# Patient Record
Sex: Female | Born: 1935 | Race: White | Hispanic: No | Smoking: Former smoker
Health system: Southern US, Community
[De-identification: ages and names within clinical notes are randomized; demographics above are authoritative.]

## PROBLEM LIST (undated history)

## (undated) DIAGNOSIS — C801 Malignant (primary) neoplasm, unspecified: Secondary | ICD-10-CM

## (undated) DIAGNOSIS — I10 Essential (primary) hypertension: Secondary | ICD-10-CM

## (undated) DIAGNOSIS — I6529 Occlusion and stenosis of unspecified carotid artery: Secondary | ICD-10-CM

## (undated) DIAGNOSIS — E079 Disorder of thyroid, unspecified: Secondary | ICD-10-CM

## (undated) DIAGNOSIS — N390 Urinary tract infection, site not specified: Secondary | ICD-10-CM

## (undated) DIAGNOSIS — Z7189 Other specified counseling: Secondary | ICD-10-CM

## (undated) DIAGNOSIS — J189 Pneumonia, unspecified organism: Secondary | ICD-10-CM

## (undated) HISTORY — DX: Other specified counseling: Z71.89

## (undated) HISTORY — DX: Disorder of thyroid, unspecified: E07.9

## (undated) HISTORY — PX: EYE SURGERY: SHX253

## (undated) SURGERY — BRONCHOSCOPY, WITH FLUOROSCOPY
Anesthesia: Moderate Sedation

---

## 1999-01-21 ENCOUNTER — Encounter: Payer: Self-pay | Admitting: Internal Medicine

## 1999-01-21 ENCOUNTER — Ambulatory Visit (HOSPITAL_COMMUNITY): Admission: RE | Admit: 1999-01-21 | Discharge: 1999-01-21 | Payer: Self-pay | Admitting: Internal Medicine

## 2000-04-23 ENCOUNTER — Ambulatory Visit (HOSPITAL_COMMUNITY): Admission: RE | Admit: 2000-04-23 | Discharge: 2000-04-23 | Payer: Self-pay | Admitting: Internal Medicine

## 2000-04-23 ENCOUNTER — Encounter: Payer: Self-pay | Admitting: Internal Medicine

## 2000-12-20 ENCOUNTER — Other Ambulatory Visit: Admission: RE | Admit: 2000-12-20 | Discharge: 2000-12-20 | Payer: Self-pay | Admitting: Internal Medicine

## 2001-05-09 ENCOUNTER — Ambulatory Visit (HOSPITAL_COMMUNITY): Admission: RE | Admit: 2001-05-09 | Discharge: 2001-05-09 | Payer: Self-pay | Admitting: Internal Medicine

## 2001-05-09 ENCOUNTER — Encounter: Payer: Self-pay | Admitting: Internal Medicine

## 2002-05-19 ENCOUNTER — Ambulatory Visit (HOSPITAL_COMMUNITY): Admission: RE | Admit: 2002-05-19 | Discharge: 2002-05-19 | Payer: Self-pay | Admitting: Internal Medicine

## 2002-05-19 ENCOUNTER — Encounter: Payer: Self-pay | Admitting: Internal Medicine

## 2003-06-01 ENCOUNTER — Ambulatory Visit (HOSPITAL_COMMUNITY): Admission: RE | Admit: 2003-06-01 | Discharge: 2003-06-01 | Payer: Self-pay | Admitting: Internal Medicine

## 2003-12-20 ENCOUNTER — Other Ambulatory Visit: Admission: RE | Admit: 2003-12-20 | Discharge: 2003-12-20 | Payer: Self-pay | Admitting: Internal Medicine

## 2004-08-22 ENCOUNTER — Ambulatory Visit (HOSPITAL_COMMUNITY): Admission: RE | Admit: 2004-08-22 | Discharge: 2004-08-22 | Payer: Self-pay | Admitting: Internal Medicine

## 2004-10-03 ENCOUNTER — Ambulatory Visit (HOSPITAL_COMMUNITY): Admission: RE | Admit: 2004-10-03 | Discharge: 2004-10-03 | Payer: Self-pay | Admitting: Internal Medicine

## 2005-08-28 ENCOUNTER — Ambulatory Visit (HOSPITAL_COMMUNITY): Admission: RE | Admit: 2005-08-28 | Discharge: 2005-08-28 | Payer: Self-pay | Admitting: Internal Medicine

## 2006-01-08 ENCOUNTER — Emergency Department (HOSPITAL_COMMUNITY): Admission: EM | Admit: 2006-01-08 | Discharge: 2006-01-08 | Payer: Self-pay | Admitting: Emergency Medicine

## 2006-02-02 ENCOUNTER — Ambulatory Visit (HOSPITAL_COMMUNITY): Admission: RE | Admit: 2006-02-02 | Discharge: 2006-02-02 | Payer: Self-pay | Admitting: Internal Medicine

## 2006-02-02 ENCOUNTER — Encounter: Payer: Self-pay | Admitting: Vascular Surgery

## 2006-09-17 ENCOUNTER — Ambulatory Visit (HOSPITAL_COMMUNITY): Admission: RE | Admit: 2006-09-17 | Discharge: 2006-09-17 | Payer: Self-pay | Admitting: Internal Medicine

## 2007-02-04 ENCOUNTER — Ambulatory Visit (HOSPITAL_COMMUNITY): Admission: RE | Admit: 2007-02-04 | Discharge: 2007-02-04 | Payer: Self-pay | Admitting: Internal Medicine

## 2007-09-30 ENCOUNTER — Ambulatory Visit (HOSPITAL_COMMUNITY): Admission: RE | Admit: 2007-09-30 | Discharge: 2007-09-30 | Payer: Self-pay | Admitting: Internal Medicine

## 2008-03-02 ENCOUNTER — Ambulatory Visit (HOSPITAL_COMMUNITY): Admission: RE | Admit: 2008-03-02 | Discharge: 2008-03-02 | Payer: Self-pay | Admitting: Internal Medicine

## 2008-10-05 ENCOUNTER — Ambulatory Visit (HOSPITAL_COMMUNITY): Admission: RE | Admit: 2008-10-05 | Discharge: 2008-10-05 | Payer: Self-pay | Admitting: Internal Medicine

## 2009-10-11 ENCOUNTER — Ambulatory Visit (HOSPITAL_COMMUNITY): Admission: RE | Admit: 2009-10-11 | Discharge: 2009-10-11 | Payer: Self-pay | Admitting: Internal Medicine

## 2010-08-15 ENCOUNTER — Ambulatory Visit: Payer: Medicare Other | Attending: Family Medicine | Admitting: Physical Therapy

## 2010-08-15 DIAGNOSIS — H8110 Benign paroxysmal vertigo, unspecified ear: Secondary | ICD-10-CM | POA: Insufficient documentation

## 2010-08-15 DIAGNOSIS — IMO0001 Reserved for inherently not codable concepts without codable children: Secondary | ICD-10-CM | POA: Insufficient documentation

## 2010-08-18 ENCOUNTER — Ambulatory Visit: Payer: Medicare Other | Admitting: Physical Therapy

## 2010-08-19 ENCOUNTER — Ambulatory Visit: Payer: Medicare Other | Admitting: Physical Therapy

## 2010-08-26 ENCOUNTER — Ambulatory Visit: Payer: Medicare Other | Admitting: Physical Therapy

## 2010-08-28 ENCOUNTER — Ambulatory Visit: Payer: Medicare Other | Admitting: Physical Therapy

## 2010-09-03 ENCOUNTER — Ambulatory Visit: Payer: Medicare Other | Admitting: Rehabilitative and Restorative Service Providers"

## 2010-09-05 ENCOUNTER — Ambulatory Visit: Payer: Medicare Other | Admitting: Physical Therapy

## 2010-09-08 ENCOUNTER — Ambulatory Visit: Payer: Medicare Other | Admitting: Physical Therapy

## 2010-09-09 ENCOUNTER — Other Ambulatory Visit: Payer: Self-pay | Admitting: Internal Medicine

## 2010-09-09 ENCOUNTER — Encounter: Payer: Medicare Other | Admitting: Physical Therapy

## 2010-09-09 ENCOUNTER — Ambulatory Visit (HOSPITAL_COMMUNITY)
Admission: RE | Admit: 2010-09-09 | Discharge: 2010-09-09 | Disposition: A | Discharge status: Home / Self Care | Payer: Medicare Other | Source: Ambulatory Visit | Attending: Internal Medicine | Admitting: Internal Medicine

## 2010-09-09 DIAGNOSIS — I6529 Occlusion and stenosis of unspecified carotid artery: Secondary | ICD-10-CM | POA: Insufficient documentation

## 2010-09-09 DIAGNOSIS — R0989 Other specified symptoms and signs involving the circulatory and respiratory systems: Secondary | ICD-10-CM

## 2010-09-12 ENCOUNTER — Encounter: Payer: Medicare Other | Admitting: Physical Therapy

## 2010-09-29 ENCOUNTER — Other Ambulatory Visit (HOSPITAL_COMMUNITY): Payer: Self-pay | Admitting: Internal Medicine

## 2010-09-29 DIAGNOSIS — Z1231 Encounter for screening mammogram for malignant neoplasm of breast: Secondary | ICD-10-CM

## 2010-10-13 ENCOUNTER — Ambulatory Visit (HOSPITAL_COMMUNITY)
Admission: RE | Admit: 2010-10-13 | Discharge: 2010-10-13 | Disposition: A | Discharge status: Home / Self Care | Payer: Medicare Other | Source: Ambulatory Visit | Attending: Internal Medicine | Admitting: Internal Medicine

## 2010-10-13 DIAGNOSIS — Z1231 Encounter for screening mammogram for malignant neoplasm of breast: Secondary | ICD-10-CM | POA: Insufficient documentation

## 2011-02-27 ENCOUNTER — Other Ambulatory Visit (HOSPITAL_COMMUNITY): Payer: Self-pay | Admitting: Internal Medicine

## 2011-02-27 DIAGNOSIS — Z780 Asymptomatic menopausal state: Secondary | ICD-10-CM

## 2011-03-09 ENCOUNTER — Ambulatory Visit (HOSPITAL_COMMUNITY)
Admission: RE | Admit: 2011-03-09 | Discharge: 2011-03-09 | Disposition: A | Discharge status: Home / Self Care | Payer: Medicare Other | Source: Ambulatory Visit | Attending: Internal Medicine | Admitting: Internal Medicine

## 2011-03-09 DIAGNOSIS — Z780 Asymptomatic menopausal state: Secondary | ICD-10-CM

## 2011-03-09 DIAGNOSIS — Z13820 Encounter for screening for osteoporosis: Secondary | ICD-10-CM | POA: Insufficient documentation

## 2011-09-07 ENCOUNTER — Ambulatory Visit: Payer: Medicare Other | Attending: Internal Medicine | Admitting: Physical Therapy

## 2011-09-07 DIAGNOSIS — R42 Dizziness and giddiness: Secondary | ICD-10-CM | POA: Insufficient documentation

## 2011-09-07 DIAGNOSIS — IMO0001 Reserved for inherently not codable concepts without codable children: Secondary | ICD-10-CM | POA: Insufficient documentation

## 2011-09-15 ENCOUNTER — Ambulatory Visit: Payer: Medicare Other | Attending: Internal Medicine | Admitting: Physical Therapy

## 2011-09-15 DIAGNOSIS — IMO0001 Reserved for inherently not codable concepts without codable children: Secondary | ICD-10-CM | POA: Insufficient documentation

## 2011-09-15 DIAGNOSIS — R42 Dizziness and giddiness: Secondary | ICD-10-CM | POA: Insufficient documentation

## 2011-09-18 ENCOUNTER — Other Ambulatory Visit (HOSPITAL_COMMUNITY): Payer: Self-pay | Admitting: Internal Medicine

## 2011-09-18 DIAGNOSIS — Z1231 Encounter for screening mammogram for malignant neoplasm of breast: Secondary | ICD-10-CM

## 2011-09-20 ENCOUNTER — Emergency Department (INDEPENDENT_AMBULATORY_CARE_PROVIDER_SITE_OTHER)
Admission: EM | Admit: 2011-09-20 | Discharge: 2011-09-20 | Disposition: A | Discharge status: Home / Self Care | Payer: Medicare Other | Source: Home / Self Care | Attending: Emergency Medicine | Admitting: Emergency Medicine

## 2011-09-20 ENCOUNTER — Encounter (HOSPITAL_COMMUNITY): Payer: Self-pay | Admitting: *Deleted

## 2011-09-20 DIAGNOSIS — N390 Urinary tract infection, site not specified: Secondary | ICD-10-CM

## 2011-09-20 HISTORY — DX: Occlusion and stenosis of unspecified carotid artery: I65.29

## 2011-09-20 HISTORY — DX: Essential (primary) hypertension: I10

## 2011-09-20 HISTORY — DX: Urinary tract infection, site not specified: N39.0

## 2011-09-20 LAB — POCT URINALYSIS DIP (DEVICE)
Specific Gravity, Urine: 1.01 (ref 1.005–1.030)
Urobilinogen, UA: 0.2 mg/dL (ref 0.0–1.0)

## 2011-09-20 MED ORDER — NITROFURANTOIN MONOHYD MACRO 100 MG PO CAPS: 100.0000 mg | ORAL_CAPSULE | Freq: Two times a day (BID) | ORAL | Status: AC

## 2011-09-20 MED ORDER — PHENAZOPYRIDINE HCL 200 MG PO TABS: 200.0000 mg | ORAL_TABLET | Freq: Three times a day (TID) | ORAL | Status: AC | PRN

## 2011-09-20 NOTE — Discharge Instructions (Signed)

## 2011-09-20 NOTE — ED Notes (Signed)
Pt with onset of burning with urination and frequency yesterday  - yesterday pressure abd

## 2011-09-20 NOTE — ED Provider Notes (Signed)
History     CSN: [id number]  Arrival date & time 09/20/11  6578   First MD Initiated Contact with Patient 09/20/11 0957      Chief Complaint  Patient presents with  . Urinary Frequency  . Dysuria    (Consider location/radiation/quality/duration/timing/severity/associated sxs/prior treatment) HPI Comments:  Patient complains of dysuria, urinary urgency, frequency, dribbling starting yesterday. No oderous or cloudy urine, hematuria. Reports Sensation of incompletely emptying her bladder. No urinary incontinence. No abdominal pain, abdominal distention, nausea, vomiting, fevers, back pain. No aggravating, alleviating factors. Has not tried anything for her symptoms.  ROS as noted in HPI. All other ROS negative.   Patient is a 78 y.o. female presenting with dysuria.  Dysuria  This is a new problem. The current episode started yesterday. The problem occurs every urination. The quality of the pain is described as burning. There has been no fever. She is not sexually active. There is no history of pyelonephritis. Associated symptoms include frequency and urgency. Pertinent negatives include no chills, no sweats, no nausea, no vomiting, no discharge, no hematuria, no hesitancy and no flank pain. She has tried nothing for the symptoms.    Past Medical History  Diagnosis Date  . Hypertension   . External carotid artery stenosis     L side dx'd with doppler 2012  . UTI (lower urinary tract infection)     History reviewed. No pertinent past surgical history.  History reviewed. No pertinent family history.  History  Substance Use Topics  . Smoking status: Former Games developer  . Smokeless tobacco: Not on file  . Alcohol Use: No    OB History    Grav Para Term Preterm Abortions TAB SAB Ect Mult Living                  Review of Systems  Constitutional: Negative for chills.  Gastrointestinal: Negative for nausea and vomiting.  Genitourinary: Positive for dysuria, urgency and frequency.  Negative for hesitancy, hematuria and flank pain.    Allergies  Review of patient's allergies indicates no known allergies.  Home Medications   Current Outpatient Rx  Name Route Sig Dispense Refill  . ASPIRIN 81 MG PO TABS Oral Take 81 mg by mouth daily.    [name] DOXAZOSIN MESYLATE PO Oral Take by mouth.    [name] LOSARTAN POTASSIUM 25 MG PO TABS Oral Take by mouth daily.    [name] ONE-DAILY MULTI VITAMINS PO TABS Oral Take 1 tablet by mouth daily.    [name] VERAPAMIL HCL ER (CO) PO Oral Take by mouth.    [name] NITROFURANTOIN MONOHYD MACRO 100 MG PO CAPS Oral Take 1 capsule (100 mg total) by mouth 2 (two) times daily. 10 capsule 0  . PHENAZOPYRIDINE HCL 200 MG PO TABS Oral Take 1 tablet (200 mg total) by mouth 3 (three) times daily as needed for pain. 6 tablet 0    BP 160/74  Pulse 60  Temp(Src) 97.7 F (36.5 C) (Oral)  Resp 16  SpO2 97%  Physical Exam  Nursing note and vitals reviewed. Constitutional: She is oriented to person, place, and time. She appears well-developed and well-nourished. No distress.  HENT:  Head: Normocephalic and atraumatic.  Eyes: Conjunctivae and EOM are normal.  Neck: Normal range of motion.  Cardiovascular: Normal rate.   Pulmonary/Chest: Effort normal.  Abdominal: Soft. Bowel sounds are normal. She exhibits no distension and no mass. There is no tenderness. There is no CVA tenderness.  Musculoskeletal: Normal range of motion.  Neurological: She  is alert and oriented to person, place, and time.  Skin: Skin is warm and dry.  Psychiatric: She has a normal mood and affect. Her behavior is normal. Judgment and thought content normal.    ED Course  Procedures (including critical care time)  Labs Reviewed  POCT URINALYSIS DIP (DEVICE) - Abnormal; Notable for the following:    Hgb urine dipstick MODERATE (*)    Nitrite POSITIVE (*)    Leukocytes, UA MODERATE (*) Biochemical Testing Only. Please order routine urinalysis from main lab if confirmatory testing is needed.    All other components within normal limits   No results found.   1. UTI (lower urinary tract infection)       MDM  Previous records, imaging reviewed. Additional medical history obtained. Patient nontoxic, no palpable bladder. No signs of obstruction or acute urinary retention. Will send home with Macrobid, and Pyridium. BP noted. Patient asymptomatic. Discussed importance of taking her blood pressure medications on a regular basis, and monitor her pressures at home. Discussed signs for/symptoms of hypertensive emergency, and patient to go to the ER for any of these. Patient agrees with plan.        Luiz Blare, MD 09/20/11 [phone]

## 2011-09-22 ENCOUNTER — Ambulatory Visit: Payer: Medicare Other | Admitting: Physical Therapy

## 2011-09-29 ENCOUNTER — Ambulatory Visit: Payer: Medicare Other | Admitting: Physical Therapy

## 2011-10-07 ENCOUNTER — Encounter: Payer: Medicare Other | Admitting: Physical Therapy

## 2011-10-09 ENCOUNTER — Encounter: Payer: Medicare Other | Admitting: Physical Therapy

## 2011-10-14 ENCOUNTER — Ambulatory Visit (HOSPITAL_COMMUNITY)
Admission: RE | Admit: 2011-10-14 | Discharge: 2011-10-14 | Disposition: A | Discharge status: Home / Self Care | Payer: Medicare Other | Source: Ambulatory Visit | Attending: Internal Medicine | Admitting: Internal Medicine

## 2011-10-14 DIAGNOSIS — Z1231 Encounter for screening mammogram for malignant neoplasm of breast: Secondary | ICD-10-CM | POA: Insufficient documentation

## 2012-03-02 ENCOUNTER — Encounter (HOSPITAL_COMMUNITY): Payer: Medicare Other

## 2013-02-03 ENCOUNTER — Other Ambulatory Visit (HOSPITAL_COMMUNITY): Payer: Self-pay | Admitting: Internal Medicine

## 2013-02-03 DIAGNOSIS — Z0000 Encounter for general adult medical examination without abnormal findings: Secondary | ICD-10-CM

## 2013-02-10 ENCOUNTER — Ambulatory Visit (HOSPITAL_COMMUNITY)
Admission: RE | Admit: 2013-02-10 | Discharge: 2013-02-10 | Disposition: A | Discharge status: Home / Self Care | Payer: Medicare Other | Source: Ambulatory Visit | Attending: Internal Medicine | Admitting: Internal Medicine

## 2013-02-10 DIAGNOSIS — Z0000 Encounter for general adult medical examination without abnormal findings: Secondary | ICD-10-CM

## 2013-02-10 DIAGNOSIS — Z1231 Encounter for screening mammogram for malignant neoplasm of breast: Secondary | ICD-10-CM | POA: Insufficient documentation

## 2013-02-16 ENCOUNTER — Other Ambulatory Visit: Payer: Self-pay | Admitting: Internal Medicine

## 2013-02-16 DIAGNOSIS — R928 Other abnormal and inconclusive findings on diagnostic imaging of breast: Secondary | ICD-10-CM

## 2013-03-06 ENCOUNTER — Ambulatory Visit
Admission: RE | Admit: 2013-03-06 | Discharge: 2013-03-06 | Disposition: A | Discharge status: Home / Self Care | Payer: Medicare Other | Source: Ambulatory Visit | Attending: Internal Medicine | Admitting: Internal Medicine

## 2013-03-06 DIAGNOSIS — R928 Other abnormal and inconclusive findings on diagnostic imaging of breast: Secondary | ICD-10-CM

## 2013-06-11 ENCOUNTER — Ambulatory Visit (INDEPENDENT_AMBULATORY_CARE_PROVIDER_SITE_OTHER): Payer: Medicare Other | Admitting: Family Medicine

## 2013-06-11 ENCOUNTER — Ambulatory Visit: Payer: Medicare Other

## 2013-06-11 VITALS — BP 120/60 | HR 65 | Temp 97.7°F | Resp 18 | Ht 63.5 in | Wt 152.0 lb

## 2013-06-11 DIAGNOSIS — J189 Pneumonia, unspecified organism: Secondary | ICD-10-CM

## 2013-06-11 DIAGNOSIS — E039 Hypothyroidism, unspecified: Secondary | ICD-10-CM

## 2013-06-11 LAB — POCT CBC
Granulocyte percent: 55.9 %G (ref 37–80)
Hemoglobin: 10.8 g/dL — AB (ref 12.2–16.2)
Lymph, poc: 0.9 (ref 0.6–3.4)
MCHC: 30.8 g/dL — AB (ref 31.8–35.4)
MPV: 9.7 fL (ref 0–99.8)
POC LYMPH PERCENT: 20.5 %L (ref 10–50)
POC MID %: 23.6 %M — AB (ref 0–12)
RBC: 3.5 M/uL — AB (ref 4.04–5.48)
RDW, POC: 13.7 %
WBC: 4.6 10*3/uL (ref 4.6–10.2)

## 2013-06-11 LAB — T3, FREE: T3, Free: 2.5 pg/mL (ref 2.3–4.2)

## 2013-06-11 MED ORDER — GUAIFENESIN-CODEINE 100-10 MG/5ML PO SOLN: 5.0000 mL | Freq: Four times a day (QID) | ORAL | Status: DC | PRN

## 2013-06-11 MED ORDER — LEVOFLOXACIN 500 MG PO TABS: 500.0000 mg | ORAL_TABLET | Freq: Every day | ORAL | Status: DC

## 2013-06-11 MED ORDER — BENZONATATE 100 MG PO CAPS: 100.0000 mg | ORAL_CAPSULE | Freq: Three times a day (TID) | ORAL | Status: DC | PRN

## 2013-06-11 NOTE — Patient Instructions (Signed)

## 2013-06-11 NOTE — Progress Notes (Addendum)
Subjective:  This chart was scribed for Norberto Sorenson, MD by Carl Best, Medical Scribe. This patient was seen in Room 8 and the patient's care was started at 1:18 PM.   Patient ID: [name] Carlson, female    DOB: 03/10/1923, 79 y.o.   MRN: [id number] Chief Complaint  Patient presents with  . Cough    congestion x 3 days   HPI HPI Comments: [name] Carlson is a 79 y.o. female who presents to the [facility] in the company of her 2 sons complaining of constant non-productive, dry cough that started three days ago.  The patient's sons state that her cough is located deep in her chest and sounds very rattling.  The patient states that her symptoms started with fatigue and chills.  She denies fever, sinus pressure, sore throat, postnasal drip, headache, chest pain, palpitations, and leg swelling as associated symptoms.  She lists rhinorrhea, bilateral otalgia, decreased energy, and decreased appetite and fluid intake associated symptoms.  The patient's sons state that the patient has been much more fatigued and dyspneic on exertion. They hear her up mult times o/n coughing and notice that she does seem ShoB esp w/ cough.  She states that she has not taken any OTC medications - she doesn't like to take any medication. Her sons states that on Tuesday the patient has a blood screening for her thyroid with Dr. Neva Seat though she does not have an appt to see her physician then - just to get her thyroid labs drawn.  She states she has been taking her thyroid medication for over a year.   The patient denies having a history of Pneumonia.   The patient's sons state that the patient smoked 40-50 years ago.    Past Medical History  Diagnosis Date  . Hypertension   . External carotid artery stenosis     L side dx'd with doppler 2012  . UTI (lower urinary tract infection)    History reviewed. No pertinent past surgical history. History reviewed. No pertinent family history. History    Social History  . Marital Status: Widowed    Spouse Name: N/A    Number of Children: N/A  . Years of Education: N/A   Occupational History  . Not on file.   Social History Main Topics  . Smoking status: Former Games developer  . Smokeless tobacco: Not on file  . Alcohol Use: No  . Drug Use: No  . Sexual Activity:    Other Topics Concern  . Not on file   Social History Narrative  . No narrative on file   Allergies  Allergen Reactions  . Corticosteroids      Review of Systems  Constitutional: Positive for chills, diaphoresis, activity change, appetite change and fatigue. Negative for fever and unexpected weight change.  HENT: Positive for congestion, ear pain and rhinorrhea. Negative for sinus pressure, sneezing, sore throat, trouble swallowing and voice change.   Respiratory: Positive for cough and shortness of breath. Negative for chest tightness and wheezing.   Cardiovascular: Negative for chest pain, palpitations and leg swelling.  Neurological: Negative for headaches.  Hematological: Negative for adenopathy.  Psychiatric/Behavioral: Positive for sleep disturbance.     BP 120/60  Pulse 65  Temp(Src) 97.7 F (36.5 C) (Oral)  Resp 18  Ht 5' 3.5" (1.613 m)  Wt 152 lb (68.947 kg)  BMI 26.50 kg/m2  SpO2 98% Objective:  Physical Exam  Nursing note and vitals reviewed. Constitutional: She is oriented  to person, place, and time. She appears well-developed and well-nourished. No distress.  HENT:  Head: Normocephalic and atraumatic.  Right Ear: Tympanic membrane, external ear and ear canal normal.  Left Ear: Tympanic membrane, external ear and ear canal normal.  Nose: Mucosal edema and rhinorrhea present.  Mouth/Throat: Uvula is midline and oropharynx is clear and moist. No oropharyngeal exudate.  Occluded with cerumen bilaterally.  Eyes: Conjunctivae are normal. Pupils are equal, round, and reactive to light.  Neck: Normal range of motion. Neck supple. No thyromegaly  present.  Cardiovascular: Normal rate and regular rhythm.   Murmur (Left lower sternal boarder. ) heard.  Systolic murmur is present with a grade of 2/6  Pulmonary/Chest: Effort normal. She has no decreased breath sounds. She has no wheezes. She has no rhonchi. She has rales (inspiratory) in the right lower field.  Right lower lobe inspiratory rales.    Lymphadenopathy:    She has no cervical adenopathy.  Neurological: She is alert and oriented to person, place, and time.  Skin: Skin is warm and dry.  Psychiatric: She has a normal mood and affect. Her behavior is normal.    Results for orders placed in visit on 06/11/13  POCT CBC      Result Value Range   WBC 4.6  4.6 - 10.2 K/uL   Lymph, poc 0.9  0.6 - 3.4   POC LYMPH PERCENT 20.5  10 - 50 %L   MID (cbc) 1.1 (*) 0 - 0.9   POC MID % 23.6 (*) 0 - 12 %M   POC Granulocyte 2.6  2 - 6.9   Granulocyte percent 55.9  37 - 80 %G   RBC 3.50 (*) 4.04 - 5.48 M/uL   Hemoglobin 10.8 (*) 12.2 - 16.2 g/dL   HCT, POC 40.9 (*) 81.1 - 47.9 %   MCV 100.4 (*) 80 - 97 fL   MCH, POC 30.9  27 - 31.2 pg   MCHC 30.8 (*) 31.8 - 35.4 g/dL   RDW, POC 91.4     Platelet Count, POC 113 (*) 142 - 424 K/uL   MPV 9.7  0 - 99.8 fL  [facility] reading (PRIMARY) by  Dr. Clelia Croft. CXR: coursened interstitial infiltrates  EXAM: CHEST 2 VIEW  COMPARISON: 03/09/2011  FINDINGS: Moderate cardiac enlargement. No pleural effusion identified. No interstitial edema. Chronic interstitial coarsening is noted bilaterally. In the posterior lung the left lung base there is an opacity which is best seen on the lateral radiograph which is worrisome for pneumonia. Nodular density in the left upper lobe is also noted which appears new from previous exam measuring 9 mm. The visualized skeletal structures appear unremarkable.  IMPRESSION: 1. Left posterior lung base opacity consistent with pneumonia. 2. Nodular density in the left upper lobe is indeterminate. Recommend further  assessment with non contrast CT of the chest. These results will be called to the ordering clinician or representative by the Radiologist Assistant, and communication documented in the PACS Dashboard. .  Assessment & Plan:  CAP (community acquired pneumonia) - Plan: POCT CBC, DG Chest 2 View - start levaquin. VS and cbc reassuring so ok to recheck in 1 wk esp as she is being watched closely by family. RTC sooner if worsening.  Unspecified hypothyroidism - Plan: TSH, T3, Free - Pt due for routine thyroid labs this wk - will collect since have to draw cbc today and have results faxed to PCP.  Meds ordered this encounter  Medications  . levothyroxine (SYNTHROID, LEVOTHROID) 50  MCG tablet    Sig: Take 50 mcg by mouth daily before breakfast.  . docusate sodium (COLACE) 100 MG capsule    Sig: Take 100 mg by mouth 2 (two) times daily.  [name] levofloxacin (LEVAQUIN) 500 MG tablet    Sig: Take 1 tablet (500 mg total) by mouth daily.    Dispense:  7 tablet    Refill:  0  . benzonatate (TESSALON) 100 MG capsule    Sig: Take 1-2 capsules (100-200 mg total) by mouth 3 (three) times daily as needed for cough.    Dispense:  40 capsule    Refill:  0  . guaiFENesin-codeine 100-10 MG/5ML syrup    Sig: Take 5 mLs by mouth every 6 (six) hours as needed for cough.    Dispense:  120 mL    Refill:  0    I personally performed the services described in this documentation, which was scribed in my presence. The recorded information has been reviewed and considered, and addended by me as needed.  Norberto Sorenson, MD MPH   Have notes and labs faxed over to pt's PCP Dr. Chilton Si

## 2013-06-13 ENCOUNTER — Encounter: Payer: Self-pay | Admitting: Family Medicine

## 2013-06-13 ENCOUNTER — Telehealth: Payer: Self-pay | Admitting: Radiology

## 2013-06-13 NOTE — Telephone Encounter (Signed)
Faxed records to pcp Dr Chilton Si

## 2013-06-19 ENCOUNTER — Ambulatory Visit (INDEPENDENT_AMBULATORY_CARE_PROVIDER_SITE_OTHER): Payer: Medicare Other | Admitting: Family Medicine

## 2013-06-19 VITALS — BP 160/84 | HR 68 | Temp 97.9°F | Resp 18 | Ht 62.5 in | Wt 150.2 lb

## 2013-06-19 DIAGNOSIS — R911 Solitary pulmonary nodule: Secondary | ICD-10-CM

## 2013-06-19 DIAGNOSIS — J189 Pneumonia, unspecified organism: Secondary | ICD-10-CM

## 2013-06-19 NOTE — Patient Instructions (Signed)
RESTART YOUR BLOOD PRESSURE MEDICATIONS  Pneumonia, Adult Pneumonia is an infection of the lungs.  CAUSES Pneumonia may be caused by bacteria or a virus. Usually, these infections are caused by breathing infectious particles into the lungs (respiratory tract). SYMPTOMS   Cough.  Fever.  Chest pain.  Increased rate of breathing.  Wheezing.  Mucus production. DIAGNOSIS  If you have the common symptoms of pneumonia, your caregiver will typically confirm the diagnosis with a chest X-ray. The X-ray will show an abnormality in the lung (pulmonary infiltrate) if you have pneumonia. Other tests of your blood, urine, or sputum may be done to find the specific cause of your pneumonia. Your caregiver may also do tests (blood gases or pulse oximetry) to see how well your lungs are working. TREATMENT  Some forms of pneumonia may be spread to other people when you cough or sneeze. You may be asked to wear a mask before and during your exam. Pneumonia that is caused by bacteria is treated with antibiotic medicine. Pneumonia that is caused by the influenza virus may be treated with an antiviral medicine. Most other viral infections must run their course. These infections will not respond to antibiotics.  PREVENTION A pneumococcal shot (vaccine) is available to prevent a common bacterial cause of pneumonia. This is usually suggested for:  People over 79 years old.  Patients on chemotherapy.  People with chronic lung problems, such as bronchitis or emphysema.  People with immune system problems. If you are over 65 or have a high risk condition, you may receive the pneumococcal vaccine if you have not received it before. In some countries, a routine influenza vaccine is also recommended. This vaccine can help prevent some cases of pneumonia.You may be offered the influenza vaccine as part of your care. If you smoke, it is time to quit. You may receive instructions on how to stop smoking. Your  caregiver can provide medicines and counseling to help you quit. HOME CARE INSTRUCTIONS   Cough suppressants may be used if you are losing too much rest. However, coughing protects you by clearing your lungs. You should avoid using cough suppressants if you can.  Your caregiver may have prescribed medicine if he or she thinks your pneumonia is caused by a bacteria or influenza. Finish your medicine even if you start to feel better.  Your caregiver may also prescribe an expectorant. This loosens the mucus to be coughed up.  Only take over-the-counter or prescription medicines for pain, discomfort, or fever as directed by your caregiver.  Do not smoke. Smoking is a common cause of bronchitis and can contribute to pneumonia. If you are a smoker and continue to smoke, your cough may last several weeks after your pneumonia has cleared.  A cold steam vaporizer or humidifier in your room or home may help loosen mucus.  Coughing is often worse at night. Sleeping in a semi-upright position in a recliner or using a couple pillows under your head will help with this.  Get rest as you feel it is needed. Your body will usually let you know when you need to rest. SEEK IMMEDIATE MEDICAL CARE IF:   Your illness becomes worse. This is especially true if you are elderly or weakened from any other disease.  You cannot control your cough with suppressants and are losing sleep.  You begin coughing up blood.  You develop pain which is getting worse or is uncontrolled with medicines.  You have a fever.  Any of the symptoms which  initially brought you in for treatment are getting worse rather than better.  You develop shortness of breath or chest pain. MAKE SURE YOU:   Understand these instructions.  Will watch your condition.  Will get help right away if you are not doing well or get worse. Document Released: 06/01/2005 Document Revised: 08/24/2011 Document Reviewed: 08/21/2010 [facility] Patient  Information 2014 [place], [place].

## 2013-06-19 NOTE — Progress Notes (Addendum)
Subjective:    Patient ID: [name] Carlson, female    DOB: 25-Mar-1923, 80 y.o.   MRN: [id number] Chief Complaint  Patient presents with  . Follow-up    seen for pneumonia pt feels better    HPI This chart was scribed for Aquilla Solian, by Lovena Le Day, Scribe. This patient was seen in room 13 and the patient's care was started at 8:49 PM.  HPI Comments: [name] Carlson is a 80 y.o. female who presents to the [facility] to f/u on her pna. She was seen here x1 week ago and had normal CBC but CXR showed left posterior lung base PNA. Also had a new 9 mm LUB nodule from september 2012 reccommended to access w/non contrast chest CT.  Today, she reports feeling better and her energy level seems to be improved, she finished her Levaquin rx 2d prev. Also doing well w/her breathing, coughing very little, not having to use the coughing medicine that she was rx with.  On arrival today, she has elevated BP and admits to not taking her BP medicines lately.  There are no active problems to display for this patient.  History reviewed. No pertinent past surgical history.  No family history on file.  History   Social History  . Marital Status: Widowed    Spouse Name: N/A    Number of Children: N/A  . Years of Education: N/A   Occupational History  . Not on file.   Social History Main Topics  . Smoking status: Former Research scientist (life sciences)  . Smokeless tobacco: Never Used  . Alcohol Use: No  . Drug Use: No  . Sexual Activity: Not on file   Other Topics Concern  . Not on file   Social History Narrative  . No narrative on file   Allergies  Allergen Reactions  . Corticosteroids     Results for orders placed in visit on 06/11/13  TSH      Result Value Range   TSH 0.689  0.350 - 4.500 uIU/mL  T3, FREE      Result Value Range   T3, Free 2.5  2.3 - 4.2 pg/mL  POCT CBC      Result Value Range   WBC 4.6  4.6 - 10.2 K/uL   Lymph, poc 0.9  0.6 - 3.4   POC LYMPH PERCENT 20.5  10 -  50 %L   MID (cbc) 1.1 (*) 0 - 0.9   POC MID % 23.6 (*) 0 - 12 %M   POC Granulocyte 2.6  2 - 6.9   Granulocyte percent 55.9  37 - 80 %G   RBC 3.50 (*) 4.04 - 5.48 M/uL   Hemoglobin 10.8 (*) 12.2 - 16.2 g/dL   HCT, POC 35.1 (*) 37.7 - 47.9 %   MCV 100.4 (*) 80 - 97 fL   MCH, POC 30.9  27 - 31.2 pg   MCHC 30.8 (*) 31.8 - 35.4 g/dL   RDW, POC 13.7     Platelet Count, POC 113 (*) 142 - 424 K/uL   MPV 9.7  0 - 99.8 fL   Review of Systems  Constitutional: Negative for fever, chills, activity change and fatigue.  HENT: Negative for congestion, rhinorrhea and sinus pressure.   Respiratory: Positive for cough (very mild cough). Negative for apnea, chest tightness, shortness of breath and wheezing.   Cardiovascular: Negative for chest pain.  Gastrointestinal: Negative for vomiting and abdominal pain.  Hematological: Negative for adenopathy.  Psychiatric/Behavioral:  Negative for sleep disturbance.      Objective:   Physical Exam  Nursing note and vitals reviewed. Constitutional: She is oriented to person, place, and time. She appears well-developed and well-nourished. No distress.  HENT:  Head: Normocephalic and atraumatic.  Right Ear: External ear normal.  Left Ear: External ear normal.  Nose: Nose normal.  Mouth/Throat: Oropharynx is clear and moist. No oropharyngeal exudate.  Eyes: Conjunctivae are normal. Right eye exhibits no discharge. Left eye exhibits no discharge.  Neck: Normal range of motion.  Cardiovascular: Normal rate, regular rhythm and normal heart sounds.   No murmur heard. Pulmonary/Chest: Effort normal. No respiratory distress.  Still w/RLL inspiratory rales.  Musculoskeletal: Normal range of motion. She exhibits no edema.  Lymphadenopathy:    She has no cervical adenopathy.  Neurological: She is alert and oriented to person, place, and time.  Skin: Skin is warm and dry.  Psychiatric: She has a normal mood and affect. Thought content normal.   Tiage Vitals: BP  160/84  Pulse 68  Temp(Src) 97.9 F (36.6 C) (Oral)  Resp 18  Ht 5' 2.5" (1.588 m)  Wt 150 lb 3.2 oz (68.13 kg)  BMI 27.02 kg/m2  SpO2 98%  858 PM - BP manual: 158/90     Assessment & Plan:  CAP (community acquired pneumonia) - Plan: CT Chest Wo Contrast - discussed that if she begins to feel worse again then we would reevaluate and consider extending antibiotic medicine and perform another CXR as exam abnormalities still persist. We discussed based on her clinical evaluation today even though she might be feeling better, her PNA may take sev wks to clear on imaging and exam. Patient agrees.   Lung nodule seen on imaging study - Plan: CT Chest Wo Contrast - sched chest CT to get further details.  No orders of the defined types were placed in this encounter.    I personally performed the services described in this documentation, which was scribed in my presence. The recorded information has been reviewed and considered, and addended by me as needed.  Delman Cheadle, MD MPH

## 2013-06-23 ENCOUNTER — Ambulatory Visit
Admission: RE | Admit: 2013-06-23 | Discharge: 2013-06-23 | Disposition: A | Discharge status: Home / Self Care | Payer: Medicare Other | Source: Ambulatory Visit | Attending: Family Medicine | Admitting: Family Medicine

## 2013-06-23 DIAGNOSIS — J189 Pneumonia, unspecified organism: Secondary | ICD-10-CM

## 2013-06-23 DIAGNOSIS — R911 Solitary pulmonary nodule: Secondary | ICD-10-CM

## 2013-06-25 ENCOUNTER — Other Ambulatory Visit: Payer: Self-pay | Admitting: Family Medicine

## 2013-06-25 DIAGNOSIS — R911 Solitary pulmonary nodule: Secondary | ICD-10-CM

## 2013-11-20 ENCOUNTER — Encounter: Payer: Self-pay | Admitting: Podiatrist

## 2013-11-20 ENCOUNTER — Ambulatory Visit (INDEPENDENT_AMBULATORY_CARE_PROVIDER_SITE_OTHER): Payer: Medicare Other | Admitting: Podiatrist

## 2013-11-20 VITALS — BP 170/81 | HR 67 | Resp 18 | Ht 62.0 in | Wt 152.0 lb

## 2013-11-20 DIAGNOSIS — L408 Other psoriasis: Secondary | ICD-10-CM

## 2013-11-20 DIAGNOSIS — L400 Psoriasis vulgaris: Secondary | ICD-10-CM

## 2013-11-20 MED ORDER — BETAMETHASONE DIPROPIONATE 0.05 % EX OINT: TOPICAL_OINTMENT | Freq: Every day | CUTANEOUS | Status: DC

## 2013-11-20 NOTE — Patient Instructions (Signed)
Psoriasis Psoriasis is a common, long-lasting (chronic) inflammation of the skin. It affects both men and women equally, of all ages and all races. Psoriasis cannot be passed from person to person (not contagious). Psoriasis varies from mild to very severe. When severe, it can greatly affect your quality of life. Psoriasis is an inflammatory disorder affecting the skin as well as other organs including the joints (causing an arthritis). With psoriasis, the skin sheds its top layer of cells more rapidly than it does in someone without psoriasis. CAUSES  The cause of psoriasis is largely unknown. Genetics, your immune system, and the environment seem to play a role in causing psoriasis. Factors that can make psoriasis worse include:  Damage or trauma to the skin, such as cuts, scrapes, and sunburn. This damage often causes new areas of psoriasis (lesions).  Winter dryness and lack of sunlight.  Medicines such as lithium, beta-blockers, antimalarial drugs, ACE inhibitors, nonsteroidal anti-inflammatory drugs (ibuprofen, aspirin), and terbinafine. Let your caregiver know if you are taking any of these drugs.  Alcohol. Excessive alcohol use should be avoided if you have psoriasis. Drinking large amounts of alcohol can affect:  How well your psoriasis treatment works.  How safe your psoriasis treatment is.  Smoking. If you smoke, ask your caregiver for help to quit.  Stress.  Bacterial or viral infections.  Arthritis. Arthritis associated with psoriasis (psoriatic arthritis) affects less than 10% of patients with psoriasis. The arthritic intensity does not always match the skin psoriasis intensity. It is important to let your caregiver know if your joints hurt or if they are stiff. SYMPTOMS  The most common form of psoriasis begins with little red bumps that gradually become larger. The bumps begin to form scales that flake off easily. The lower layers of scales stick together. When these scales  are scratched or removed, the underlying skin is tender and bleeds easily. These areas then grow in size and may become large. Psoriasis often creates a rash that looks the same on both sides of the body (symmetrical). It often affects the elbows, knees, groin, genitals, arms, legs, scalp, and nails. Affected nails often have pitting, loosen, thicken, crumble, and are difficult to treat.  "Inverse psoriasis"occurs in the armpits, under breasts, in skin folds, and around the groin, buttocks, and genitals.  "Guttate psoriasis" generally occurs in children and young adults following a recent sore throat (strep throat). It begins with many small, red, scaly spots on the skin. It clears spontaneously in weeks or a few months without treatment. DIAGNOSIS  Psoriasis is diagnosed by physical exam. A tissue sample (biopsy) may also be taken. TREATMENT The treatment of psoriasis depends on your age, health, and living conditions.  Steroid (cortisone) creams, lotions, and ointments may be used. These treatments are associated with thinning of the skin, blood vessels that get larger (dilated), loss of skin pigmentation, and easy bruising. It is important to use these steroids as directed by your caregiver. Only treat the affected areas and not the normal, unaffected skin. People on long-term steroid treatment should wear a medical alert bracelet. Injections may be used in areas that are difficult to treat.  Scalp treatments are available as shampoos, solutions, sprays, foams, and oils. Avoid scratching the scalp and picking at the scales.  Anthralin medicine works well on areas that are difficult to treat. However, it stains clothes and skin and may cause temporary irritation.  Synthetic vitamin D (calcipotriene)can be used on small areas. It is available by prescription. The forms   of synthetic vitamin D available in health food stores do not help with psoriasis.  Coal tarsare available in various strengths  for psoriasis that is difficult to treat. They are one of the longest used treatments for difficult to treat psoriasis. However, they are messy to use.  Light therapy (UV therapy) can be carefully and professionally monitored in a dermatologist's office. Careful sunbathing is helpful for many people as directed by your caregiver. The exposure should be just long enough to cause a mild redness (erythema) of your skin. Avoid sunburn as this may make the condition worse. Sunscreen (SPF of 30 or higher) should be used to protect against sunburn. Cataracts, wrinkles, and skin aging are some of the harmful side effects of light therapy.  If creams (topical medicines) fail, there are several other options for systemic or oral medicines your caregiver can suggest. Psoriasis can sometimes be very difficult to treat. It can come and go. It is necessary to follow up with your caregiver regularly if your psoriasis is difficult to treat. Usually, with persistence you can get a good amount of relief. Maintaining consistent care is important. Do not change caregivers just because you do not see immediate results. It may take several trials to find the right combination of treatment for you. PREVENTING FLARE-UPS  Wear gloves while you wash dishes, while cleaning, and when you are outside in the cold.  If you have radiators, place a bowl of water or damp towel on the radiator. This will help put water back in the air. You can also use a humidifier to keep the air moist. Try to keep the humidity at about 60% in your home.  Apply moisturizer while your skin is still damp from bathing or showering. This traps water in the skin.  Avoid long, hot baths or showers. Keep soap use to a minimum. Soaps dry out the skin and wash away the protective oils. Use a fragrance free, dye free soap.  Drink enough water and fluids to keep your urine clear or pale yellow. Not drinking enough water depletes your skin's water  supply.  Turn off the heat at night and keep it low during the day. Cool air is less drying. SEEK MEDICAL CARE IF:  You have increasing pain in the affected areas.  You have uncontrolled bleeding in the affected areas.  You have increasing redness or warmth in the affected areas.  You start to have pain or stiffness in your joints.  You start feeling depressed about your condition.  You have a fever. Document Released: 05/29/2000 Document Revised: 08/24/2011 Document Reviewed: 11/24/2010 [employer] Patient Information 2014 [employer].  

## 2013-11-20 NOTE — Progress Notes (Signed)
   Subjective:    Patient ID: [name] Carlson, female    DOB: 11-02-1922, 80 y.o.   MRN: [id number]  HPI Comments: Pt presents with red, shiny skin with peeling, and complains of pain and swelling.  Per pt Dr. Denna Haggard has been treating since Fall 2014 with warm water soaks and Vaseline.  Pt's nurse friend recommended Lotrimin cream and Aquaphor after a 1 cup of white vinegar in warm water for about 2 months.     Review of Systems  Respiratory: Positive for cough.   Neurological: Positive for dizziness.       Occasional vertigo.  All other systems reviewed and are negative.      Objective:   Physical Exam Patient is awake, alert, and oriented x 3.  In no acute distress.  Vascular status is intact with palpable pedal pulses at 2/4 DP and PT bilateral and capillary refill time within normal limits. Neurological sensation is also intact bilaterally via Semmes Weinstein monofilament at 5/5 sites. Light touch, vibratory sensation, Achilles tendon reflex is intact. Dermatological exam reveals skin color, turger and texture as within normal limits right.  Left heel has a hard plaque that has formed and the plantar foot and anterior ankle have scaling skin with a red base.  Swelling of the left ankle is present as well. No open lesions present.  Musculature intact with dorsiflexion, plantarflexion, inversion, eversion.     Assessment & Plan:  Plaque psoriasis Plan: Debrided the plaque on her left heel as well as I could. Recommended her continued use of soaking and vinegar water to soften the psoriasis plaque. Also recommended betamethasone dipropionate which was called into her pharmacy as well. She was given instructions for use. I will see her back in one month for followup and probable debridement of the plaque. If any problems or concerns arise in the meantime she will call.

## 2013-12-20 ENCOUNTER — Ambulatory Visit (INDEPENDENT_AMBULATORY_CARE_PROVIDER_SITE_OTHER): Payer: Medicare Other | Admitting: Podiatrist

## 2013-12-20 ENCOUNTER — Encounter: Payer: Self-pay | Admitting: Podiatrist

## 2013-12-20 VITALS — BP 137/60 | HR 71 | Resp 18

## 2013-12-20 DIAGNOSIS — Q828 Other specified congenital malformations of skin: Secondary | ICD-10-CM

## 2013-12-20 DIAGNOSIS — L408 Other psoriasis: Secondary | ICD-10-CM

## 2013-12-20 DIAGNOSIS — L400 Psoriasis vulgaris: Secondary | ICD-10-CM

## 2013-12-20 NOTE — Progress Notes (Signed)
It is doing a lot better on my left heel but there is some rough places that has not gone away  [name] Carlson presents today for followup of plaque lesion on the left heel. She states it's doing much better however there some areas that are still present and tender.  Objective: Neurovascular status unchanged large plaque is present on the plantar aspect of the left heel at the area of pressure.  Assessment: Plaque left heel  Plan: Debrided the plaque to the best of my ability with a 15 blade. Recommended that she also use a pumice stone at home to help get the remainder of off. She will continue to use her topical treatments and will call if she would like further debridement in the future.

## 2014-02-01 ENCOUNTER — Other Ambulatory Visit: Payer: Self-pay | Admitting: Internal Medicine

## 2014-02-01 ENCOUNTER — Ambulatory Visit
Admission: RE | Admit: 2014-02-01 | Discharge: 2014-02-01 | Disposition: A | Discharge status: Home / Self Care | Payer: Medicare Other | Source: Ambulatory Visit | Attending: Internal Medicine | Admitting: Internal Medicine

## 2014-02-01 DIAGNOSIS — R05 Cough: Secondary | ICD-10-CM

## 2014-02-01 DIAGNOSIS — R059 Cough, unspecified: Secondary | ICD-10-CM

## 2014-02-05 ENCOUNTER — Other Ambulatory Visit: Payer: Self-pay | Admitting: Internal Medicine

## 2014-02-05 DIAGNOSIS — R911 Solitary pulmonary nodule: Secondary | ICD-10-CM

## 2014-02-06 ENCOUNTER — Ambulatory Visit
Admission: RE | Admit: 2014-02-06 | Discharge: 2014-02-06 | Disposition: A | Discharge status: Home / Self Care | Payer: Medicare Other | Source: Ambulatory Visit | Attending: Internal Medicine | Admitting: Internal Medicine

## 2014-02-06 ENCOUNTER — Other Ambulatory Visit: Payer: Medicare Other

## 2014-02-06 DIAGNOSIS — R911 Solitary pulmonary nodule: Secondary | ICD-10-CM

## 2014-02-23 ENCOUNTER — Other Ambulatory Visit (HOSPITAL_COMMUNITY): Payer: Self-pay | Admitting: Internal Medicine

## 2014-02-23 DIAGNOSIS — Z1231 Encounter for screening mammogram for malignant neoplasm of breast: Secondary | ICD-10-CM

## 2014-03-09 ENCOUNTER — Ambulatory Visit (HOSPITAL_COMMUNITY)
Admission: RE | Admit: 2014-03-09 | Discharge: 2014-03-09 | Disposition: A | Discharge status: Home / Self Care | Payer: Medicare Other | Source: Ambulatory Visit | Attending: Internal Medicine | Admitting: Internal Medicine

## 2014-03-09 DIAGNOSIS — Z1231 Encounter for screening mammogram for malignant neoplasm of breast: Secondary | ICD-10-CM | POA: Insufficient documentation

## 2014-03-12 ENCOUNTER — Ambulatory Visit
Admission: RE | Admit: 2014-03-12 | Discharge: 2014-03-12 | Disposition: A | Discharge status: Home / Self Care | Payer: Medicare Other | Source: Ambulatory Visit | Attending: Internal Medicine | Admitting: Internal Medicine

## 2014-03-12 ENCOUNTER — Other Ambulatory Visit: Payer: Self-pay | Admitting: Internal Medicine

## 2014-03-12 DIAGNOSIS — R05 Cough: Secondary | ICD-10-CM

## 2014-03-12 DIAGNOSIS — R059 Cough, unspecified: Secondary | ICD-10-CM

## 2014-03-29 ENCOUNTER — Ambulatory Visit (INDEPENDENT_AMBULATORY_CARE_PROVIDER_SITE_OTHER): Payer: Medicare Other | Admitting: Pulmonary Disease

## 2014-03-29 ENCOUNTER — Other Ambulatory Visit: Payer: Medicare Other

## 2014-03-29 ENCOUNTER — Other Ambulatory Visit (INDEPENDENT_AMBULATORY_CARE_PROVIDER_SITE_OTHER): Payer: Medicare Other

## 2014-03-29 ENCOUNTER — Encounter: Payer: Self-pay | Admitting: Pulmonary Disease

## 2014-03-29 VITALS — BP 140/66 | HR 69 | Ht 62.5 in | Wt 154.0 lb

## 2014-03-29 DIAGNOSIS — R9389 Abnormal findings on diagnostic imaging of other specified body structures: Secondary | ICD-10-CM

## 2014-03-29 DIAGNOSIS — R05 Cough: Secondary | ICD-10-CM

## 2014-03-29 DIAGNOSIS — Z5181 Encounter for therapeutic drug level monitoring: Secondary | ICD-10-CM

## 2014-03-29 DIAGNOSIS — R059 Cough, unspecified: Secondary | ICD-10-CM | POA: Insufficient documentation

## 2014-03-29 DIAGNOSIS — R938 Abnormal findings on diagnostic imaging of other specified body structures: Secondary | ICD-10-CM

## 2014-03-29 DIAGNOSIS — R918 Other nonspecific abnormal finding of lung field: Secondary | ICD-10-CM

## 2014-03-29 LAB — CBC WITH DIFFERENTIAL/PLATELET
Basophils Absolute: 0 10*3/uL (ref 0.0–0.1)
Basophils Relative: 0.3 % (ref 0.0–3.0)
EOS PCT: 5.8 % — AB (ref 0.0–5.0)
Eosinophils Absolute: 0.3 10*3/uL (ref 0.0–0.7)
HCT: 33.7 % — ABNORMAL LOW (ref 36.0–46.0)
HEMOGLOBIN: 11.2 g/dL — AB (ref 12.0–15.0)
Lymphocytes Relative: 20.6 % (ref 12.0–46.0)
Lymphs Abs: 1 10*3/uL (ref 0.7–4.0)
MCHC: 33.2 g/dL (ref 30.0–36.0)
MCV: 94.5 fl (ref 78.0–100.0)
MONOS PCT: 16.6 % — AB (ref 3.0–12.0)
Monocytes Absolute: 0.8 10*3/uL (ref 0.1–1.0)
NEUTROS ABS: 2.8 10*3/uL (ref 1.4–7.7)
Neutrophils Relative %: 56.7 % (ref 43.0–77.0)
Platelets: 198 10*3/uL (ref 150.0–400.0)
RBC: 3.57 Mil/uL — ABNORMAL LOW (ref 3.87–5.11)
RDW: 14.2 % (ref 11.5–15.5)
WBC: 4.9 10*3/uL (ref 4.0–10.5)

## 2014-03-29 LAB — PROTIME-INR
INR: 1.1 ratio — ABNORMAL HIGH (ref 0.8–1.0)
PROTHROMBIN TIME: 12.3 s (ref 9.6–13.1)

## 2014-03-29 MED ORDER — BENZONATATE 200 MG PO CAPS: 200.0000 mg | ORAL_CAPSULE | Freq: Three times a day (TID) | ORAL | Status: DC | PRN

## 2014-03-29 NOTE — Progress Notes (Signed)
Subjective:    Patient ID: [name] Carlson, female    DOB: 16-Jul-1922, 80 y.o.   MRN: [id number]  HPI Chief Complaint  Patient presents with  . Advice Only    Referred by Dr. Nyoka Cowden for rattling in R lung Xseveral months.    [name] is here to see me because last Christmas she had pneumonia and had a cough that was nagging afterwards.  She has strugled with cough for sometime afterwards and she feels like the cough just hasn't gotten better.  Overall she feels like she has gotten better, but her physician has noticed a persistent rattling in her right chest so she was referred here.  By report she has had a chest x-ray which apparently has showed some scarring.  She says that she feels like he rstrength is coming back in the last few weeks.  She said that it took her a "long while" to recover from her pneumonia and it has been tough to recover.  She says she has been steadily improving.  She says that she has been coughing during this time and never coughs up anything.  She has not chest congestion but has a tickle in her throat.  She cough will certainly get better with drinking water. She coughs all day at random times.  Eating does not change the frequency or intensity of the cough.  Talking more makes her cough more.  She was treated with antibiotics twice in the last year.  No chest pain, fever, or weight loss or night sweats.  Past Medical History  Diagnosis Date  . Hypertension   . External carotid artery stenosis     L side dx'd with doppler 2012  . UTI (lower urinary tract infection)      Family History  Problem Relation Age of Onset  . Emphysema Father      History   Social History  . Marital Status: Widowed    Spouse Name: N/A    Number of Children: N/A  . Years of Education: N/A   Occupational History  . Not on file.   Social History Main Topics  . Smoking status: Former Smoker -- 1.00 packs/day for 12 years    Types: Cigarettes    Quit date: 06/16/1959  .  Smokeless tobacco: Never Used  . Alcohol Use: No  . Drug Use: No  . Sexual Activity: Not on file   Other Topics Concern  . Not on file   Social History Narrative  . No narrative on file     Allergies  Allergen Reactions  . Corticosteroids   . Minocycline     Pt states became disoriented and had a stiff neck.     Outpatient Prescriptions Prior to Visit  Medication Sig Dispense Refill  . aspirin 81 MG tablet Take 81 mg by mouth daily.      . betamethasone dipropionate (DIPROLENE) 0.05 % ointment Apply topically daily.  50 g  2  . docusate sodium (COLACE) 100 MG capsule Take 100 mg by mouth 2 (two) times daily.      [name] DOXAZOSIN MESYLATE PO Take by mouth.      . levothyroxine (SYNTHROID, LEVOTHROID) 50 MCG tablet Take 50 mcg by mouth daily before breakfast.      . losartan (COZAAR) 25 MG tablet Take by mouth daily.      [name] VERAPAMIL HCL ER, CO, PO Take by mouth.       No facility-administered medications prior to visit.  Review of Systems  Constitutional: Negative for fever and unexpected weight change.  HENT: Negative for congestion, dental problem, ear pain, nosebleeds, postnasal drip, rhinorrhea, sinus pressure, sneezing, sore throat and trouble swallowing.   Eyes: Negative for redness and itching.  Respiratory: Positive for cough and shortness of breath. Negative for chest tightness and wheezing.   Cardiovascular: Negative for palpitations and leg swelling.  Gastrointestinal: Negative for nausea and vomiting.  Genitourinary: Negative for dysuria.  Musculoskeletal: Negative for joint swelling.  Skin: Negative for rash.  Neurological: Negative for headaches.  Hematological: Does not bruise/bleed easily.  Psychiatric/Behavioral: Negative for dysphoric mood. The patient is not nervous/anxious.        Objective:   Physical Exam Filed Vitals:   03/29/14 1435  BP: 140/66  Pulse: 69  Height: 5' 2.5" (1.588 m)  Weight: 154 lb (69.854 kg)  SpO2: 97%   RA  Ambulated 500 feet and O2 saturation remained normal throughout  Gen: well appearing, no acute distress HEENT: NCAT, PERRL, EOMi, OP clear, neck supple without masses PULM: Crackles R base, otherwise clear CV: RRR, no mgr, no JVD AB: BS+, soft, nontender, no hsm Ext: warm, no edema, no clubbing, no cyanosis Derm: no rash or skin breakdown Neuro: A&Ox4, CN II-XII intact, strength 5/5 in all 4 extremities  Images reviewed by me: 03/12/2014 chest x-ray reveals a right upper lobe consolidation which has not resolved and in fact appears worse compared to chest imaging from August. 02/06/2014 CT chest> there is right lower lobe consolidation in a dependent area, there is no significant mediastinal lymphadenopathy; the consolidation has not changed significantly since January 2015       Assessment & Plan:   Abnormal chest CT Mrs. Stovall is a non-resolving pneumonia syndrome with a persistent right lower lobe infiltrate over the last several months. Her physical exam is consistent with this. What concerns me most is that the infiltrate appears to be growing in size since January 2015. Despite this, she appears fairly well with the exception of cough.  I explained to both she and her daughter that the differential diagnosis here is broad and includes an atypical infection, organizing pneumonia, or less likely an inflammatory process or malignancy. Because of this problem has persisted for so long she needs to undergo a bronchoscopy for further evaluation.  I think that aspiration is also likely.  Plan: -Repeat CT chest to see if there is evidence of mediastinal lymphadenopathy which would change our bronchoscopy approach to an EBUS - Plan for bronchoscopy Friday 10/23 at 0800 at [place] after midnight 10/22 - CBC, PT/INR now - if bronch work up negative will get barium swallow and start empiric prednisone for organizing pneumonia  Cough Likley due to whatever is going on in  her right lung.  Plan: -voice rest, tessalon -work up as above     Updated Medication List Outpatient Encounter Prescriptions as of 03/29/2014  Medication Sig  . aspirin 81 MG tablet Take 81 mg by mouth daily.  . betamethasone dipropionate (DIPROLENE) 0.05 % ointment Apply topically daily.  [name] docusate sodium (COLACE) 100 MG capsule Take 100 mg by mouth 2 (two) times daily.  [name] DOXAZOSIN MESYLATE PO Take by mouth.  . levothyroxine (SYNTHROID, LEVOTHROID) 50 MCG tablet Take 50 mcg by mouth daily before breakfast.  . losartan (COZAAR) 25 MG tablet Take by mouth daily.  [name] VERAPAMIL HCL ER, CO, PO Take by mouth.  . benzonatate (TESSALON) 200 MG capsule Take 1 capsule (200 mg  total) by mouth 3 (three) times daily as needed for cough.

## 2014-03-29 NOTE — Assessment & Plan Note (Signed)
Likley due to whatever is going on in her right lung.  Plan: -voice rest, tessalon -work up as above

## 2014-03-29 NOTE — Patient Instructions (Addendum)
We will arrange blood work today and a CT scan for tomorrow or Monday We will arrange a bronchoscopy for next Friday at 8:00 at [facility] need to try to suppress your cough to allow your larynx (voice box) to heal.  For three days don't talk, laugh, sing, or clear your throat. Do everything you can to suppress the cough during this time. Use hard candies (sugarless Jolly Ranchers) or non-mint or non-menthol containing cough drops during this time to soothe your throat.  Use a cough suppressant (Tessalon) around the clock during this time.  After three days, gradually increase the use of your voice and back off on the cough suppressants.  We will schedule a follow up appointment for 2-3 weeks from now

## 2014-03-29 NOTE — Assessment & Plan Note (Addendum)
[name] Carlson is a non-resolving pneumonia syndrome with a persistent right lower lobe infiltrate over the last several months. Her physical exam is consistent with this. What concerns me most is that the infiltrate appears to be growing in size since January 2015. Despite this, she appears fairly well with the exception of cough.  I explained to both she and her daughter that the differential diagnosis here is broad and includes an atypical infection, organizing pneumonia, or less likely an inflammatory process or malignancy. Because of this problem has persisted for so long she needs to undergo a bronchoscopy for further evaluation.  I think that aspiration is also likely.  Plan: -Repeat CT chest to see if there is evidence of mediastinal lymphadenopathy which would change our bronchoscopy approach to an EBUS - Plan for bronchoscopy Friday 10/23 at 0800 at [place] after midnight 10/22 - CBC, PT/INR now - if bronch work up negative will get barium swallow and start empiric prednisone for organizing pneumonia

## 2014-04-02 ENCOUNTER — Ambulatory Visit (INDEPENDENT_AMBULATORY_CARE_PROVIDER_SITE_OTHER)
Admission: RE | Admit: 2014-04-02 | Discharge: 2014-04-02 | Disposition: A | Discharge status: Home / Self Care | Payer: Medicare Other | Source: Ambulatory Visit | Attending: Pulmonary Disease | Admitting: Pulmonary Disease

## 2014-04-02 ENCOUNTER — Encounter (HOSPITAL_COMMUNITY): Payer: Self-pay | Admitting: Pharmacy Technician

## 2014-04-02 DIAGNOSIS — R918 Other nonspecific abnormal finding of lung field: Secondary | ICD-10-CM

## 2014-04-06 ENCOUNTER — Encounter (HOSPITAL_COMMUNITY): Payer: Self-pay

## 2014-04-06 ENCOUNTER — Ambulatory Visit (HOSPITAL_COMMUNITY): Payer: Medicare Other

## 2014-04-06 ENCOUNTER — Telehealth: Payer: Self-pay | Admitting: Pulmonary Disease

## 2014-04-06 ENCOUNTER — Inpatient Hospital Stay (HOSPITAL_COMMUNITY)
Admission: RE | Admit: 2014-04-06 | Discharge: 2014-04-06 | Disposition: A | Discharge status: Home / Self Care | Payer: Medicare Other | Source: Ambulatory Visit

## 2014-04-06 ENCOUNTER — Ambulatory Visit (HOSPITAL_COMMUNITY)
Admission: RE | Admit: 2014-04-06 | Discharge: 2014-04-06 | Disposition: A | Discharge status: Home / Self Care | Payer: Medicare Other | Source: Ambulatory Visit | Attending: Pulmonary Disease | Admitting: Pulmonary Disease

## 2014-04-06 ENCOUNTER — Encounter (HOSPITAL_COMMUNITY)
Admission: RE | Disposition: A | Discharge status: Home / Self Care | Payer: Self-pay | Source: Ambulatory Visit | Attending: Pulmonary Disease

## 2014-04-06 DIAGNOSIS — R918 Other nonspecific abnormal finding of lung field: Secondary | ICD-10-CM

## 2014-04-06 DIAGNOSIS — I10 Essential (primary) hypertension: Secondary | ICD-10-CM | POA: Insufficient documentation

## 2014-04-06 DIAGNOSIS — J984 Other disorders of lung: Secondary | ICD-10-CM | POA: Insufficient documentation

## 2014-04-06 DIAGNOSIS — Z87891 Personal history of nicotine dependence: Secondary | ICD-10-CM | POA: Diagnosis not present

## 2014-04-06 DIAGNOSIS — I6522 Occlusion and stenosis of left carotid artery: Secondary | ICD-10-CM | POA: Insufficient documentation

## 2014-04-06 DIAGNOSIS — Z888 Allergy status to other drugs, medicaments and biological substances status: Secondary | ICD-10-CM | POA: Diagnosis not present

## 2014-04-06 DIAGNOSIS — R9389 Abnormal findings on diagnostic imaging of other specified body structures: Secondary | ICD-10-CM

## 2014-04-06 DIAGNOSIS — R938 Abnormal findings on diagnostic imaging of other specified body structures: Secondary | ICD-10-CM

## 2014-04-06 HISTORY — PX: VIDEO BRONCHOSCOPY: SHX5072

## 2014-04-06 HISTORY — DX: Pneumonia, unspecified organism: J18.9

## 2014-04-06 SURGERY — BRONCHOSCOPY, WITH FLUOROSCOPY
Anesthesia: Moderate Sedation | Laterality: Bilateral

## 2014-04-06 MED ORDER — FENTANYL CITRATE 0.05 MG/ML IJ SOLN
INTRAMUSCULAR | Status: AC
  Filled 2014-04-06: qty 4

## 2014-04-06 MED ORDER — LIDOCAINE HCL (PF) 1 % IJ SOLN
INTRAMUSCULAR | Status: DC | PRN
  Administered 2014-04-06 (×2): 6 mL

## 2014-04-06 MED ORDER — MIDAZOLAM HCL 5 MG/ML IJ SOLN
INTRAMUSCULAR | Status: AC
  Filled 2014-04-06: qty 2

## 2014-04-06 MED ORDER — LIDOCAINE HCL 2 % EX GEL
CUTANEOUS | Status: DC | PRN
  Administered 2014-04-06: 1

## 2014-04-06 MED ORDER — MIDAZOLAM HCL 10 MG/2ML IJ SOLN
INTRAMUSCULAR | Status: DC | PRN
  Administered 2014-04-06: 2 mg via INTRAVENOUS
  Administered 2014-04-06 (×2): 1 mg via INTRAVENOUS

## 2014-04-06 MED ORDER — FENTANYL CITRATE 0.05 MG/ML IJ SOLN
INTRAMUSCULAR | Status: DC | PRN
  Administered 2014-04-06: 50 ug via INTRAVENOUS
  Administered 2014-04-06 (×2): 25 ug via INTRAVENOUS

## 2014-04-06 MED ORDER — PHENYLEPHRINE HCL 0.25 % NA SOLN
NASAL | Status: DC | PRN
  Administered 2014-04-06: 2 via NASAL

## 2014-04-06 NOTE — Telephone Encounter (Signed)
Pt underwent FOB with TTBX earlier in day. Called to report scant hemoptysis. No respiratory distress  I reassured her that this is not unusal after lung biopsy  She is to call if she develops respiratory distress or increasing hemoptysis   Merton Border, MD ; [facility] service [place] [phone].  After 5:30 PM or weekends, call [phone]

## 2014-04-06 NOTE — Discharge Instructions (Signed)
Flexible Bronchoscopy, Care After These instructions give you information on caring for yourself after your procedure. Your doctor may also give you more specific instructions. Call your doctor if you have any problems or questions after your procedure. HOME CARE  Do not eat or drink anything for 2 hours after your procedure. YOU MAY BEGIN TO EAT AT 11AM. If you try to eat or drink before the medicine wears off, food or drink could go into your lungs. You could also burn yourself.  After 2 hours have passed and when you can cough and gag normally, you may eat soft food and drink liquids slowly.  The day after the test, you may eat your normal diet.  You may do your normal activities.  Keep all doctor visits. GET HELP RIGHT AWAY IF:  You get more and more short of breath.  You get light-headed.  You feel like you are going to pass out (faint).  You have chest pain.  You have new problems that worry you.  You cough up more than a little blood.  You cough up more blood than before. MAKE SURE YOU:  Understand these instructions.  Will watch your condition.  Will get help right away if you are not doing well or get worse. Document Released: 03/29/2009 Document Revised: 06/06/2013 Document Reviewed: 02/03/2013 [facility] Patient Information 2015 [place], [place]. This information is not intended to replace advice given to you by your health care provider. Make sure you discuss any questions you have with your health care provider.

## 2014-04-06 NOTE — Progress Notes (Addendum)
Video bronchoscopy procedure performed. Biopsy intervention performed, Bronchial washing intervention performed. Brushing intervention performed.

## 2014-04-06 NOTE — H&P (Signed)
  Update H&P  [name] Carlson is here today for a bronchoscopy for a right lower lobe mass as noted in my clinic note from one week ago.  She is feeling well this morning.  Past Medical History  Diagnosis Date  . Hypertension   . External carotid artery stenosis     L side dx'd with doppler 2012  . UTI (lower urinary tract infection)   . Pneumonia      Family History  Problem Relation Age of Onset  . Emphysema Father      History   Social History  . Marital Status: Widowed    Spouse Name: N/A    Number of Children: N/A  . Years of Education: N/A   Occupational History  . Not on file.   Social History Main Topics  . Smoking status: Former Smoker -- 1.00 packs/day for 12 years    Types: Cigarettes    Quit date: 06/16/1959  . Smokeless tobacco: Never Used  . Alcohol Use: No  . Drug Use: No  . Sexual Activity: Not on file   Other Topics Concern  . Not on file   Social History Narrative  . No narrative on file     Allergies  Allergen Reactions  . Corticosteroids   . Minocycline     Pt states became disoriented and had a stiff neck.      (Not in an outpatient encounter)  Filed Vitals:   04/06/14 0755 04/06/14 0800 04/06/14 0805 04/06/14 0806  BP: 188/65  197/66   Pulse: 70 75 71 71  Resp: 13 16 16 18   SpO2: 98% 97% 98% 98%   Gen: comfortable,  HEENT: NCAT, EOMi OP clear PULM: CTA B CV: rRR, no mgr AB: BS+, soft ,nontender Ext: warm, no edema Neuro: A&Ox4, maew  Impression: 1) RLL mass  Plan: 1) Bronchoscopy with bal and transbronchial biopsy  Roselie Awkward, MD [facility] Pager: [phone] Cell: [phone] If no response, call [phone]

## 2014-04-06 NOTE — Op Note (Signed)
PCCM Bronchoscopy Procedure Note  The patient was informed of the risks (including but not limited to bleeding, infection, respiratory failure, lung injury, tooth/oral injury) and benefits of the procedure and gave consent, see chart.  Indication: Right Lower Lobe Mass  Location: [facility]  Condition pre procedure: stable  Medications for procedure: Versed 4mg , Fentanyl 116mcg, Lidocaine 1% 12cc  Procedure description: The bronchoscope was introduced through the mouth and passed to the bilateral lungs to the level of the subsegmental bronchi throughout the tracheobronchial tree.  Airway exam revealed normal appearing and functioning larynx.  The entire tracheobronchial tree was free of inflammation, mucosal abnormality or endobronchial lesion.   Procedures performed:  1) BAL RLL posterior segment 2) Transbronchial biopsy RLL lateral segment 3) Transbronchial brushing RLL posterior segment  Specimens sent:  1) Surgical pathology RLL biopsy 2) Cytology BAL 3) Cytology Brushing 4) Bacterial, Fungal, AFB culture  Condition post procedure: stable  EBL: < 5cc  Complications: none immediate  Post procedure diagnosis: 1) Right Lower Lobe Mass  Patient instructions: Await results of biopsy, cytology and culture  Roselie Awkward, MD [facility] Pager: [phone] Cell: [phone] If no response, call [phone]

## 2014-04-06 NOTE — Progress Notes (Signed)
Brent McQuaid, MD [facility] Pager: [phone] Cell: [phone] If no response, call [phone]  

## 2014-04-08 LAB — CULTURE, RESPIRATORY W GRAM STAIN

## 2014-04-08 LAB — CULTURE, RESPIRATORY: SPECIAL REQUESTS: NORMAL

## 2014-04-09 ENCOUNTER — Telehealth: Payer: Self-pay | Admitting: Pulmonary Disease

## 2014-04-09 ENCOUNTER — Encounter (HOSPITAL_COMMUNITY): Payer: Self-pay | Admitting: Pulmonary Disease

## 2014-04-09 NOTE — Telephone Encounter (Signed)
Called and spoke to pt. Pt had bronch on 10/23. Pt states she was coughing up pink tinged sputum after the procedure that resolved in the morning of 10/24. Pt is now c/o frank blood in sputum that presented early morning today (10/26). Pt stated it has only been a couple of times and the most was the size of a nickel. Pt stated it has now turned to pink tinged sputum again. Pt denies any SOB, CP/tightness or f/c/s. Pt requesting recs. BQ is out of office, will forward to doc of day.   PW please advise.   Allergies  Allergen Reactions  . Corticosteroids   . Minocycline     Pt states became disoriented and had a stiff neck.

## 2014-04-09 NOTE — Telephone Encounter (Signed)
This should subside this is from the bronchoscopy, would watch for now

## 2014-04-09 NOTE — Telephone Encounter (Signed)
Called and spoke to pt. Informed pt of recs per PW. Pt verbalized understanding and denied any further questions or concerns at this time.

## 2014-04-12 ENCOUNTER — Telehealth: Payer: Self-pay | Admitting: Pulmonary Disease

## 2014-04-12 NOTE — Telephone Encounter (Signed)
Called to discuss bronchoscopy results showing "atypical cells" Will discuss more on next clinic visit.

## 2014-04-17 ENCOUNTER — Ambulatory Visit (INDEPENDENT_AMBULATORY_CARE_PROVIDER_SITE_OTHER): Payer: Medicare Other | Admitting: Pulmonary Disease

## 2014-04-17 ENCOUNTER — Encounter: Payer: Self-pay | Admitting: Pulmonary Disease

## 2014-04-17 ENCOUNTER — Other Ambulatory Visit (HOSPITAL_COMMUNITY): Payer: Self-pay | Admitting: Pulmonary Disease

## 2014-04-17 VITALS — BP 130/76 | HR 75 | Temp 97.8°F | Ht 62.5 in | Wt 156.4 lb

## 2014-04-17 DIAGNOSIS — R059 Cough, unspecified: Secondary | ICD-10-CM

## 2014-04-17 DIAGNOSIS — R9389 Abnormal findings on diagnostic imaging of other specified body structures: Secondary | ICD-10-CM

## 2014-04-17 DIAGNOSIS — R938 Abnormal findings on diagnostic imaging of other specified body structures: Secondary | ICD-10-CM

## 2014-04-17 DIAGNOSIS — R1314 Dysphagia, pharyngoesophageal phase: Secondary | ICD-10-CM

## 2014-04-17 DIAGNOSIS — R05 Cough: Secondary | ICD-10-CM

## 2014-04-17 NOTE — Assessment & Plan Note (Signed)
I am concerned about the cytology results which were suspicious for malignancy.  Her lesion has grown this year though she has no other symptoms that are worrisome for malignancy (weight loss, hemoptysis, etc.).  Overall this clinical picture is not consistent with with lung cancer, but I can't ignore this result.  I think it is worth ruling out aspiration as well as an atypical infection prior to proceeding with an open lung biopsy. I gave the option of proceeding with surgical biopsy at this point and she has decided not to do that.  Plan: -Modified barium swallow -Follow-up in 5 weeks after all culture results are back -May need surgical lung biopsy

## 2014-04-17 NOTE — Progress Notes (Signed)
   Subjective:    Patient ID: [name] Carlson, female    DOB: 04/08/1923, 80 y.o.   MRN: [id number]  Synopsis: referred to the [facility] in 2015 for evaluation of a growing right lower lobe infiltrate. She underwent a bronchoscopy with transbronchial biopsies and cytology studies area at the biopsies showed normal lung tissue but the brushings showed atypical cell suggestive of malignancy.  HPI  Chief Complaint  Patient presents with  . Follow-up    discuss bronch results. Pt denies any increase in cough at this time. Pt denies any SOB.    04/17/2014 routine office visit> [name] Carlson is here to follow-up after her bronchoscopy and discuss the results. Her cough has improved since the last visit and she has no shortness of breath. Her weight has not changed. She does not have chest pain. She has not had hemoptysis. Overall her symptoms have remained stable if not have improved significantly since I saw her last.  Past Medical History  Diagnosis Date  . Hypertension   . External carotid artery stenosis     L side dx'd with doppler 2012  . UTI (lower urinary tract infection)   . Pneumonia      Review of Systems  Constitutional: Negative for fever, chills and fatigue.  HENT: Negative for postnasal drip, rhinorrhea and sinus pressure.   Respiratory: Positive for cough. Negative for shortness of breath and wheezing.   Cardiovascular: Negative for chest pain, palpitations and leg swelling.       Objective:   Physical Exam Filed Vitals:   04/17/14 1439  BP: 130/76  Pulse: 75  Temp: 97.8 F (36.6 C)  TempSrc: Oral  Height: 5' 2.5" (1.588 m)  Weight: 156 lb 6.4 oz (70.943 kg)  SpO2: 98%  RA  Gen: well appearing, no acute distress HEENT: NCAT,  EOMi, OP clear, PULM: Crackles in bases bilaterally CV: RRR, no mgr, no JVD AB: BS+, soft, nontender Ext: warm, no edema, no clubbing, no cyanosis Derm: no rash or skin breakdown Neuro: A&Ox4, MAEW        Assessment & Plan:    Abnormal chest CT I am concerned about the cytology results which were suspicious for malignancy.  Her lesion has grown this year though she has no other symptoms that are worrisome for malignancy (weight loss, hemoptysis, etc.).  Overall this clinical picture is not consistent with with lung cancer, but I can't ignore this result.  I think it is worth ruling out aspiration as well as an atypical infection prior to proceeding with an open lung biopsy. I gave the option of proceeding with surgical biopsy at this point and she has decided not to do that.  Plan: -Modified barium swallow -Follow-up in 5 weeks after all culture results are back -May need surgical lung biopsy   Updated Medication List Outpatient Encounter Prescriptions as of 04/17/2014  Medication Sig  . aspirin 81 MG tablet Take 81 mg by mouth daily.  [name] docusate sodium (COLACE) 100 MG capsule Take 100 mg by mouth 2 (two) times daily.  [name] doxazosin (CARDURA) 2 MG tablet Take 4 mg by mouth daily.  [name] levothyroxine (SYNTHROID, LEVOTHROID) 50 MCG tablet Take 50 mcg by mouth daily before breakfast.  . losartan (COZAAR) 25 MG tablet Take 25 mg by mouth daily.   . verapamil (VERELAN PM) 240 MG 24 hr capsule Take 240 mg by mouth at bedtime.

## 2014-04-17 NOTE — Patient Instructions (Signed)
We will arrange a modified barium swallow for you We will see you back in 5 weeks

## 2014-04-18 ENCOUNTER — Encounter (HOSPITAL_COMMUNITY): Payer: Medicare Other

## 2014-04-30 ENCOUNTER — Other Ambulatory Visit (HOSPITAL_COMMUNITY): Payer: Self-pay | Admitting: Pulmonary Disease

## 2014-04-30 DIAGNOSIS — R1310 Dysphagia, unspecified: Secondary | ICD-10-CM

## 2014-05-01 ENCOUNTER — Ambulatory Visit (HOSPITAL_COMMUNITY)
Admission: RE | Admit: 2014-05-01 | Discharge: 2014-05-01 | Disposition: A | Discharge status: Home / Self Care | Payer: Medicare Other | Source: Ambulatory Visit | Attending: Pulmonary Disease | Admitting: Pulmonary Disease

## 2014-05-01 DIAGNOSIS — I6522 Occlusion and stenosis of left carotid artery: Secondary | ICD-10-CM | POA: Insufficient documentation

## 2014-05-01 DIAGNOSIS — R1310 Dysphagia, unspecified: Secondary | ICD-10-CM

## 2014-05-01 DIAGNOSIS — R918 Other nonspecific abnormal finding of lung field: Secondary | ICD-10-CM | POA: Insufficient documentation

## 2014-05-01 DIAGNOSIS — R05 Cough: Secondary | ICD-10-CM

## 2014-05-01 DIAGNOSIS — R9389 Abnormal findings on diagnostic imaging of other specified body structures: Secondary | ICD-10-CM

## 2014-05-01 DIAGNOSIS — I10 Essential (primary) hypertension: Secondary | ICD-10-CM | POA: Diagnosis not present

## 2014-05-01 DIAGNOSIS — R059 Cough, unspecified: Secondary | ICD-10-CM

## 2014-05-01 NOTE — Procedures (Signed)
Objective Swallowing Evaluation: Modified Barium Swallowing Study  Patient Details  Name: [name] Carlson MRN: [id number] Date of Birth: June 30, 1922  Today's Date: 05/01/2014 Time: 1310-1335 SLP Time Calculation (min) (ACUTE ONLY): 25 min  Past Medical History:  Past Medical History  Diagnosis Date  . Hypertension   . External carotid artery stenosis     L side dx'd with doppler 2012  . UTI (lower urinary tract infection)   . Pneumonia    Past Surgical History:  Past Surgical History  Procedure Laterality Date  . Video bronchoscopy Bilateral 04/06/2014    Procedure: VIDEO BRONCHOSCOPY WITH FLUORO;  Surgeon: Juanito Doom, MD;  Location: [facility];  Service: Cardiopulmonary;  Laterality: Bilateral;   HPI:  80 yo referred by Mr Lake Bells for MBS to rule out aspiration.  Pt with h/o right lower lobe infiltrate 04/06/14,  HTN, UTI.  Bronchoscopy concern for cytology - not consistent with malignancy & pt may need biopsy per MD note.       Assessment / Plan / Recommendation Clinical Impression  Dysphagia Diagnosis: Within Functional Limits Clinical impression: Pt with functional oropharyngeal swallow without aspiration or penetration of any consistency tested.  Minimal oral resiudals noted x1 which pt cleared with further swallow *pt reports she felt a coating.  Pharyngeal swallow was timely and strong.  Only trace flash laryngeal penetration of thin noted.  Barium tablet given with thin appeared to lodge at midesophagus without pt awareness, liquid swallows faciliated clearance. Prominent CP noted.  Educated pt to results and swallow strategies to maximize airway protection/clearance.  As pt denies coughing/choking with any intake nor dyspnea, do not suspect aspiration occuring.  Thanks for this consult.     Treatment Recommendation    n/a   Diet Recommendation Regular;Thin liquid   Liquid Administration via: Cup;Straw Medication Administration: Whole meds with  liquid Supervision: Patient able to self feed Compensations: Slow rate;Small sips/bites;Follow solids with liquid (consume liquids throughout meals) Postural Changes and/or Swallow Maneuvers: Seated upright 90 degrees;Upright 30-60 min after meal    Other  Recommendations Oral Care Recommendations: Oral care BID   Follow Up Recommendations  None      General Date of Onset: 05/01/14 HPI: 80 yo referred by Mr Lake Bells for MBS to rule out aspiration.  Pt with h/o right lower lobe infiltrate 04/06/14,  HTN, UTI.  Bronchoscopy concern for cytology - not consistent with malignancy & pt may need biopsy per MD note.   Type of Study: Modified Barium Swallowing Study Reason for Referral: Objectively evaluate swallowing function Diet Prior to this Study: Regular;Thin liquids Temperature Spikes Noted: No Respiratory Status: Room air Behavior/Cognition: Alert;Cooperative;Pleasant mood Oral Cavity - Dentition: Adequate natural dentition (partial - not in at this time) Oral Motor / Sensory Function: Within functional limits Self-Feeding Abilities: Able to feed self Patient Positioning: Upright in bed Baseline Vocal Quality: Clear Volitional Cough: Strong Volitional Swallow: Able to elicit Anatomy: Within functional limits    Reason for Referral Objectively evaluate swallowing function   Oral Phase Oral Preparation/Oral Phase Oral Phase: WFL (minimal oral residuals from liquids - cued dry swallow faciliated clearance) Oral - Nectar Oral - Nectar Cup: Within functional limits Oral - Thin Oral - Thin Cup: Within functional limits Oral - Thin Straw: Lingual/palatal residue (minimal oral residuals cleared with furhter swallows) Oral - Solids Oral - Puree: Within functional limits Oral - Regular: Within functional limits Oral - Pill: Delayed oral transit;Weak lingual manipulation;Lingual pumping (with thin - pt required second liquid swallow to transit  tablet)   Pharyngeal Phase Pharyngeal  Phase Pharyngeal Phase: Within functional limits Pharyngeal - Nectar Pharyngeal - Nectar Cup: Within functional limits Pharyngeal - Thin Pharyngeal - Thin Teaspoon: Within functional limits Pharyngeal - Thin Cup: Within functional limits Pharyngeal - Thin Straw: Within functional limits;Penetration/Aspiration during swallow Penetration/Aspiration details (thin straw): Material enters airway, remains ABOVE vocal cords then ejected out Pharyngeal - Solids Pharyngeal - Puree: Within functional limits Pharyngeal - Regular: Within functional limits Pharyngeal - Pill: Within functional limits  Cervical Esophageal Phase    GO    Cervical Esophageal Phase Cervical Esophageal Phase: Impaired Cervical Esophageal Phase - Nectar Nectar Cup: Prominent cricopharyngeal segment Cervical Esophageal Phase - Thin Thin Cup: Prominent cricopharyngeal segment Thin Straw: Prominent cricopharyngeal segment Cervical Esophageal Phase - Solids Puree: Prominent cricopharyngeal segment Regular: Prominent cricopharyngeal segment Pill: Prominent cricopharyngeal segment Cervical Esophageal Phase - Comment Cervical Esophageal Comment: barium tablet appeared to lodge at mid-esophagus without pt sensation, furhter intake of liquids faciliated clearance    Functional Assessment Tool Used: mbs, clinical judgement Functional Limitations: Swallowing Swallow Current Status (D2202): At least 1 percent but less than 20 percent impaired, limited or restricted Swallow Goal Status [phone]): At least 1 percent but less than 20 percent impaired, limited or restricted Swallow Discharge Status [phone]): At least 1 percent but less than 20 percent impaired, limited or restricted    Luanna Salk, [facility] SLP [phone]

## 2014-05-01 NOTE — Progress Notes (Signed)
Quick Note:  Pt aware of results. ______ 

## 2014-05-03 LAB — FUNGUS CULTURE W SMEAR
Fungal Smear: NONE SEEN
Special Requests: NORMAL

## 2014-05-19 LAB — AFB CULTURE WITH SMEAR (NOT AT ARMC)
Acid Fast Smear: NONE SEEN
SPECIAL REQUESTS: NORMAL

## 2014-05-29 ENCOUNTER — Ambulatory Visit (INDEPENDENT_AMBULATORY_CARE_PROVIDER_SITE_OTHER): Payer: Medicare Other | Admitting: Pulmonary Disease

## 2014-05-29 ENCOUNTER — Encounter: Payer: Self-pay | Admitting: Pulmonary Disease

## 2014-05-29 ENCOUNTER — Telehealth: Payer: Self-pay | Admitting: Pulmonary Disease

## 2014-05-29 VITALS — BP 130/68 | HR 71 | Temp 97.5°F

## 2014-05-29 DIAGNOSIS — R9389 Abnormal findings on diagnostic imaging of other specified body structures: Secondary | ICD-10-CM

## 2014-05-29 DIAGNOSIS — R938 Abnormal findings on diagnostic imaging of other specified body structures: Secondary | ICD-10-CM

## 2014-05-29 MED ORDER — BENZONATATE 200 MG PO CAPS: 200.0000 mg | ORAL_CAPSULE | Freq: Three times a day (TID) | ORAL | Status: DC | PRN

## 2014-05-29 NOTE — Assessment & Plan Note (Signed)
Today [name] Carlson and her daughter and I had a long conversation about the findings from her CT chest and the bronchoscopy.  I again explained that the biopsy showed "atypical cells suggestive of malignancy" and the culture results were negative.  I explained to them that considering the cytology findings and the fact that the right lower lobe mass has grown on CT that this could be malignant.  The risks of not proceeding with a surgical biopsy at this time is that the disease could progress and we could lose an opportunity to treat it more aggressively if it were malignancy.  I also explained that the differential diagnosis is broad and that this could be an atypical process like organizing pneumonia.  I gave them the option today of proceeding with a surgical biopsy versus empiric treatment with prednisone to see if the process goes away. I explained there are risks with both of these approaches including surgical complication, prolonged hospitalization, side effects from prednisone, in the potential to miss a diagnosis of a malignancy if we elected not to proceed with biopsy. This time they are very hesitant to consider proceeding with biopsy. They are about to have a number of family members come into town and they plan to discuss the situation together as a family.  Plan: -They will discuss whether or not they want to proceed with surgical biopsy or empiric treatment with prednisone -If they choose to go with prednisone then my plan will be to treat with 30 mg daily for 3 weeks followed by 20 mg daily for 3 weeks followed by 10 mg daily for 3 weeks followed by a repeat CT scan  Greater than 30 minutes were spent today in direct consultation with the patient and her daughter discussing these issues

## 2014-05-29 NOTE — Telephone Encounter (Signed)
Advised pt's daughter that BQ does not have any office hours next week. Suggested that BQ maybe able to consult with them over the phone?  BQ - would you be able to call the family? Thanks.

## 2014-05-29 NOTE — Patient Instructions (Signed)
Take the tessalon perles as needed for the cough Let me know after Christmas if you have decided to proceed with taking prednisone and repeating a CT scan or having surgery We will see you back in the end of February

## 2014-05-29 NOTE — Progress Notes (Signed)
Subjective:    Patient ID: [name] Carlson, female    DOB: May 07, 1923, 80 y.o.   MRN: [id number]  Synopsis: referred to the [facility] in 2015 for evaluation of a growing right lower lobe infiltrate. She underwent a bronchoscopy with transbronchial biopsies and cytology studies area at the biopsies showed normal lung tissue but the brushings showed atypical cell suggestive of malignancy. A modified barium swallow performed in November 2015 did not show evidence of aspiration.  HPI  Chief Complaint  Patient presents with  . Follow-up    pt has no breathing complaints. Review barium swallow.     [name] Carlson says that the cough has persisted since the last visit. She does not frequently cough up mucus. It only typically occurs when she is up and moving around and remaining active. She says that it rarely occurs when she lies flat. She has not had problems swallowing. It has not been associated with eating to her knowledge. She has not had fevers or chills. Her weight has been stable. She denies shortness of breath.  Past Medical History  Diagnosis Date  . Hypertension   . External carotid artery stenosis     L side dx'd with doppler 2012  . UTI (lower urinary tract infection)   . Pneumonia      Review of Systems  Constitutional: Negative for fever, chills and fatigue.  HENT: Negative for postnasal drip, rhinorrhea and sinus pressure.   Respiratory: Positive for cough. Negative for shortness of breath and wheezing.   Cardiovascular: Negative for chest pain, palpitations and leg swelling.       Objective:   Physical Exam Filed Vitals:   05/29/14 1158  BP: 130/68  Pulse: 71  Temp: 97.5 F (36.4 C)  TempSrc: Oral  SpO2: 95%  RA  Gen: well appearing, no acute distress HEENT: NCAT,  EOMi, OP clear, PULM: Crackles in bases bilaterally CV: RRR, no mgr, no JVD AB: BS+, soft, nontender Ext: warm, no edema, no clubbing, no cyanosis Derm: no rash or skin breakdown Neuro:  A&Ox4, MAEW  November 2015 modified barium swallow results reviewed by me      Assessment & Plan:   Abnormal chest CT Today [name] Carlson and her daughter and I had a long conversation about the findings from her CT chest and the bronchoscopy.  I again explained that the biopsy showed "atypical cells suggestive of malignancy" and the culture results were negative.  I explained to them that considering the cytology findings and the fact that the right lower lobe mass has grown on CT that this could be malignant.  The risks of not proceeding with a surgical biopsy at this time is that the disease could progress and we could lose an opportunity to treat it more aggressively if it were malignancy.  I also explained that the differential diagnosis is broad and that this could be an atypical process like organizing pneumonia.  I gave them the option today of proceeding with a surgical biopsy versus empiric treatment with prednisone to see if the process goes away. I explained there are risks with both of these approaches including surgical complication, prolonged hospitalization, side effects from prednisone, in the potential to miss a diagnosis of a malignancy if we elected not to proceed with biopsy. This time they are very hesitant to consider proceeding with biopsy. They are about to have a number of family members come into town and they plan to discuss the situation together as a family.  Plan: -They  will discuss whether or not they want to proceed with surgical biopsy or empiric treatment with prednisone -If they choose to go with prednisone then my plan will be to treat with 30 mg daily for 3 weeks followed by 20 mg daily for 3 weeks followed by 10 mg daily for 3 weeks followed by a repeat CT scan  Greater than 30 minutes were spent today in direct consultation with the patient and her daughter discussing these issues    Updated Medication List Outpatient Encounter Prescriptions as of 05/29/2014    Medication Sig  . aspirin 81 MG tablet Take 81 mg by mouth daily.  [name] docusate sodium (COLACE) 100 MG capsule Take 100 mg by mouth 2 (two) times daily.  [name] doxazosin (CARDURA) 2 MG tablet Take 4 mg by mouth daily.  [name] levothyroxine (SYNTHROID, LEVOTHROID) 50 MCG tablet Take 50 mcg by mouth daily before breakfast.  . losartan (COZAAR) 25 MG tablet Take 25 mg by mouth daily.   . verapamil (VERELAN PM) 240 MG 24 hr capsule Take 240 mg by mouth at bedtime.

## 2014-05-30 NOTE — Telephone Encounter (Signed)
Discussed with daughter 12/16 evening

## 2014-06-19 ENCOUNTER — Telehealth: Payer: Self-pay | Admitting: Pulmonary Disease

## 2014-06-19 DIAGNOSIS — R9389 Abnormal findings on diagnostic imaging of other specified body structures: Secondary | ICD-10-CM

## 2014-06-19 NOTE — Telephone Encounter (Signed)
Per pt instructions from last OV with BQ on 05/29/14:  Patient Instructions     Take the tessalon perles as needed for the cough Let me know after Christmas if you have decided to proceed with taking prednisone and repeating a CT scan or having surgery We will see you back in the end of February  Notes from OV: Plan: -They will discuss whether or not they want to proceed with surgical biopsy or empiric treatment with prednisone -If they choose to go with prednisone then my plan will be to treat with 30 mg daily for 3 weeks followed by 20 mg daily for 3 weeks followed by 10 mg daily for 3 weeks followed by a repeat CT scan  -------  lmomtcb for pt

## 2014-06-20 NOTE — Telephone Encounter (Signed)
lmomtcb x 2  

## 2014-06-21 MED ORDER — PREDNISONE 10 MG PO TABS: ORAL_TABLET | ORAL | Status: DC

## 2014-06-21 NOTE — Telephone Encounter (Signed)
Pt returned call - [phone]

## 2014-06-21 NOTE — Telephone Encounter (Signed)
Med sent in, ct ordered, pt aware. Nothing further needed.

## 2014-06-21 NOTE — Telephone Encounter (Signed)
OK prednisone: 30 mg daily for 3 weeks followed by 20 mg daily for 3 weeks followed by 10 mg daily for 3 weeks  And please order a repeat CT scan with out contrast to follow up pneumonia for 10 weeks from now

## 2014-06-21 NOTE — Telephone Encounter (Signed)
LMTC x 1  

## 2014-06-21 NOTE — Telephone Encounter (Signed)
Pt states she has decided to go forward with Prednisone Rx and CT scan after completing. Pt aware that I will send to BQ(in office this afternoon) and get approval and when to set up CT and get clear orders. Thanks.    BQ please advise.

## 2014-08-01 ENCOUNTER — Telehealth: Payer: Self-pay | Admitting: Pulmonary Disease

## 2014-08-01 NOTE — Telephone Encounter (Signed)
Spoke with pt. States that she has an appointment on 08/03/14. She doesn't have her CT scan until 08/31/14. Would like to have her ROV with BQ after that date. ROV has been rescheduled for 09/17/14 at 10am.

## 2014-08-03 ENCOUNTER — Ambulatory Visit: Payer: Medicare Other | Admitting: Pulmonary Disease

## 2014-08-31 ENCOUNTER — Ambulatory Visit (INDEPENDENT_AMBULATORY_CARE_PROVIDER_SITE_OTHER)
Admission: RE | Admit: 2014-08-31 | Discharge: 2014-08-31 | Disposition: A | Discharge status: Home / Self Care | Payer: Medicare Other | Source: Ambulatory Visit | Attending: Pulmonary Disease | Admitting: Pulmonary Disease

## 2014-08-31 DIAGNOSIS — R938 Abnormal findings on diagnostic imaging of other specified body structures: Secondary | ICD-10-CM

## 2014-08-31 DIAGNOSIS — R9389 Abnormal findings on diagnostic imaging of other specified body structures: Secondary | ICD-10-CM

## 2014-09-04 ENCOUNTER — Ambulatory Visit (INDEPENDENT_AMBULATORY_CARE_PROVIDER_SITE_OTHER): Payer: Medicare Other | Admitting: Family Medicine

## 2014-09-04 VITALS — BP 120/68 | HR 74 | Temp 98.1°F | Resp 16 | Ht 63.0 in | Wt 156.0 lb

## 2014-09-04 DIAGNOSIS — R5383 Other fatigue: Secondary | ICD-10-CM | POA: Diagnosis not present

## 2014-09-04 DIAGNOSIS — R05 Cough: Secondary | ICD-10-CM

## 2014-09-04 DIAGNOSIS — R918 Other nonspecific abnormal finding of lung field: Secondary | ICD-10-CM

## 2014-09-04 DIAGNOSIS — N3090 Cystitis, unspecified without hematuria: Secondary | ICD-10-CM | POA: Diagnosis not present

## 2014-09-04 DIAGNOSIS — R059 Cough, unspecified: Secondary | ICD-10-CM

## 2014-09-04 DIAGNOSIS — R3989 Other symptoms and signs involving the genitourinary system: Secondary | ICD-10-CM | POA: Diagnosis not present

## 2014-09-04 LAB — POCT UA - MICROSCOPIC ONLY
Casts, Ur, LPF, POC: NEGATIVE
Crystals, Ur, HPF, POC: NEGATIVE
Mucus, UA: NEGATIVE
Yeast, UA: NEGATIVE

## 2014-09-04 LAB — POCT CBC
GRANULOCYTE PERCENT: 63.8 % (ref 37–80)
HCT, POC: 33.3 % — AB (ref 37.7–47.9)
Hemoglobin: 10.8 g/dL — AB (ref 12.2–16.2)
Lymph, poc: 1.8 (ref 0.6–3.4)
MCH, POC: 31.3 pg — AB (ref 27–31.2)
MCHC: 32.4 g/dL (ref 31.8–35.4)
MCV: 96.5 fL (ref 80–97)
MID (cbc): 0.7 (ref 0–0.9)
MPV: 7.3 fL (ref 0–99.8)
POC Granulocyte: 4.5 (ref 2–6.9)
POC LYMPH PERCENT: 25.6 %L (ref 10–50)
POC MID %: 10.6 %M (ref 0–12)
Platelet Count, POC: 244 10*3/uL (ref 142–424)
RBC: 3.45 M/uL — AB (ref 4.04–5.48)
RDW, POC: 14.7 %
WBC: 7 10*3/uL (ref 4.6–10.2)

## 2014-09-04 LAB — POCT URINALYSIS DIPSTICK
BILIRUBIN UA: NEGATIVE
Blood, UA: NEGATIVE
GLUCOSE UA: NEGATIVE
Ketones, UA: NEGATIVE
Nitrite, UA: NEGATIVE
Protein, UA: NEGATIVE
SPEC GRAV UA: 1.01
UROBILINOGEN UA: 0.2
pH, UA: 6.5

## 2014-09-04 MED ORDER — AMOXICILLIN-POT CLAVULANATE 875-125 MG PO TABS: 1.0000 | ORAL_TABLET | Freq: Two times a day (BID) | ORAL | Status: DC

## 2014-09-04 NOTE — Patient Instructions (Addendum)
As we discussed, your fatigue may be due to infection in lungs, especially with recent chest cat scan results we discussed, but I am unable to determine this completely without xray tonight. I am starting a medicine that may help some with treating a lung infection, but recommend you be seen by lung doctor in next few days or your primary care provider's office. This antibiotic should also cover if there is a urine infection. You should receive a call or letter about your lab results within the next week to 10 days.   Your hemoglobin was a little low, so should be checked by your primary provider in next 2 weeks to make sure it is stable.   Return to the clinic or go to the nearest emergency room if any of your symptoms worsen or new symptoms occur.

## 2014-09-04 NOTE — Progress Notes (Addendum)
Subjective:  This chart was scribed for [name] Ray, MD by [name], ED Scribe at [facility].The patient was seen in exam room 09 and the patient's care was started at 6:14 PM.   Patient ID: [name] Carlson, female    DOB: 1923-01-29, 81 y.o.   MRN: [id number] Chief Complaint  Patient presents with  . Urinary Tract Infection    abnormal urine color since sunday    HPI HPI Comments: [name] Carlson is a 81 y.o. female who presents to [facility] complaining of a possible UTI. She has noticed a change in the color of her urine for the past 3 days. She describes the urine as cloudy and a "rusty color". No recent UTI or bladder infections. She does not usually suffer from UTI or bladder infections. She denies dysuria, hematuria, fever, chills, abdominal pain, back pain, nausea, vomiting, diarrhea, urinary frequency.   She also complains of ongoing fatigue with associated continuing cough and SOB. She was seen by a pulmonologist Dr. Lake Bells in December 2015. 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 She chose a treatment of prednisone for 9 weeks taper of 30 mg to 10 mg instead of surgical biopsy. She completed her treatment of prednisone 12 days ago. Her cough has not improved after prednisone treatment. She denies chest pain.  Patient Active Problem List   Diagnosis Date Noted  . Abnormal chest CT 03/29/2014  . Cough 03/29/2014   Past Medical History  Diagnosis Date  . Hypertension   . External carotid artery stenosis     L side dx'd with doppler 2012  . UTI (lower urinary tract infection)   . Pneumonia   . Thyroid disease    Past Surgical History  Procedure Laterality Date  . Video bronchoscopy Bilateral 04/06/2014    Procedure: VIDEO BRONCHOSCOPY WITH FLUORO;  Surgeon: Juanito Doom, MD;  Location: [facility];  Service: Cardiopulmonary;  Laterality: Bilateral;  . Eye surgery     Allergies    Allergen Reactions  . Corticosteroids   . Minocycline     Pt states became disoriented and had a stiff neck.   Prior to Admission medications   Medication Sig Start Date End Date Taking? Authorizing Provider  aspirin 81 MG tablet Take 81 mg by mouth daily.   Yes Historical Provider, MD  doxazosin (CARDURA) 2 MG tablet Take 4 mg by mouth daily.   Yes Historical Provider, MD  levothyroxine (SYNTHROID, LEVOTHROID) 50 MCG tablet Take 50 mcg by mouth daily before breakfast.   Yes Historical Provider, MD  losartan (COZAAR) 100 MG tablet Take 100 mg by mouth daily.   Yes Historical Provider, MD  verapamil (VERELAN PM) 240 MG 24 hr capsule Take 240 mg by mouth at bedtime.   Yes Historical Provider, MD  benzonatate (TESSALON) 200 MG capsule Take 1 capsule (200 mg total) by mouth 3 (three) times daily as needed for cough. Patient not taking: Reported on 09/04/2014 05/29/14   Juanito Doom, MD  docusate sodium (COLACE) 100 MG capsule Take 100 mg by mouth 2 (two) times daily.    Historical Provider, MD  predniSONE (DELTASONE) 10 MG tablet 30mg  qd X3 weeks, then 20mg  qd X3 weeks, then 10mg  X3 weeks. Patient not taking: Reported on 09/04/2014 06/21/14   Juanito Doom, MD   History   Social History  . Marital Status: Widowed    Spouse Name:  N/A  . Number of Children: N/A  . Years of Education: N/A   Occupational History  . Not on file.   Social History Main Topics  . Smoking status: Former Smoker -- 1.00 packs/day for 12 years    Types: Cigarettes    Quit date: 06/16/1959  . Smokeless tobacco: Never Used  . Alcohol Use: No  . Drug Use: No  . Sexual Activity: Not on file   Other Topics Concern  . Not on file   Social History Narrative   Review of Systems  Constitutional: Positive for fatigue. Negative for fever and chills.  Respiratory: Positive for cough and shortness of breath.   Cardiovascular: Negative for chest pain.  Gastrointestinal: Negative for nausea, vomiting,  abdominal pain and diarrhea.  Genitourinary: Negative for dysuria, frequency and hematuria.  Musculoskeletal: Negative for back pain.      Objective:  BP 120/68 mmHg  Pulse 79  Temp(Src) 98.1 F (36.7 C) (Oral)  Resp 16  Ht 5\' 3"  (1.6 m)  Wt 156 lb (70.761 kg)  BMI 27.64 kg/m2  SpO2 90% Physical Exam  Constitutional: She is oriented to person, place, and time. She appears well-developed and well-nourished. No distress.  HENT:  Head: Normocephalic and atraumatic.  Eyes: Pupils are equal, round, and reactive to light.  Neck: Normal range of motion.  Cardiovascular: Normal rate and regular rhythm.   Murmur heard.  Systolic murmur is present with a grade of 2/6  Pulmonary/Chest: No respiratory distress.  Rhonchi and coarse breath sounds in the right lower lobe as well as a coarse breath sounds in the right middle lobe.  Abdominal: Soft. She exhibits no distension. There is no tenderness. There is no rebound, no guarding and no CVA tenderness.  Musculoskeletal: Normal range of motion.  Neurological: She is alert and oriented to person, place, and time.  Skin: Skin is warm and dry.  Psychiatric: She has a normal mood and affect. Her behavior is normal.  Nursing note and vitals reviewed.  Results for orders placed or performed in visit on 09/04/14  POCT urinalysis dipstick  Result Value Ref Range   Color, UA yellow    Clarity, UA clear    Glucose, UA neg    Bilirubin, UA neg    Ketones, UA neg    Spec Grav, UA 1.010    Blood, UA neg    pH, UA 6.5    Protein, UA neg    Urobilinogen, UA 0.2    Nitrite, UA neg    Leukocytes, UA Trace   POCT UA - Microscopic Only  Result Value Ref Range   WBC, Ur, HPF, POC 0-1    RBC, urine, microscopic 0-2    Bacteria, U Microscopic 4+    Mucus, UA neg    Epithelial cells, urine per micros 0-2    Crystals, Ur, HPF, POC neg    Casts, Ur, LPF, POC neg    Yeast, UA neg    Results for orders placed or performed in visit on 09/04/14  POCT  urinalysis dipstick  Result Value Ref Range   Color, UA yellow    Clarity, UA clear    Glucose, UA neg    Bilirubin, UA neg    Ketones, UA neg    Spec Grav, UA 1.010    Blood, UA neg    pH, UA 6.5    Protein, UA neg    Urobilinogen, UA 0.2    Nitrite, UA neg    Leukocytes, UA Trace  POCT UA - Microscopic Only  Result Value Ref Range   WBC, Ur, HPF, POC 0-1    RBC, urine, microscopic 0-2    Bacteria, U Microscopic 4+    Mucus, UA neg    Epithelial cells, urine per micros 0-2    Crystals, Ur, HPF, POC neg    Casts, Ur, LPF, POC neg    Yeast, UA neg   POCT CBC  Result Value Ref Range   WBC 7.0 4.6 - 10.2 K/uL   Lymph, poc 1.8 0.6 - 3.4   POC LYMPH PERCENT 25.6 10 - 50 %L   MID (cbc) 0.7 0 - 0.9   POC MID % 10.6 0 - 12 %M   POC Granulocyte 4.5 2 - 6.9   Granulocyte percent 63.8 37 - 80 %G   RBC 3.45 (A) 4.04 - 5.48 M/uL   Hemoglobin 10.8 (A) 12.2 - 16.2 g/dL   HCT, POC 33.3 (A) 37.7 - 47.9 %   MCV 96.5 80 - 97 fL   MCH, POC 31.3 (A) 27 - 31.2 pg   MCHC 32.4 31.8 - 35.4 g/dL   RDW, POC 14.7 %   Platelet Count, POC 244 142 - 424 K/uL   MPV 7.3 0 - 99.8 fL       Assessment & Plan:   CARISA BACKHAUS is a 81 y.o. female Abnormal urine color - Plan: POCT urinalysis dipstick, POCT UA - Microscopic Only, Urine culture  Other fatigue - Plan: POCT CBC, COMPLETE METABOLIC PANEL WITH GFR, Urine culture, Ambulatory referral to Pulmonology  Lung field abnormal finding on examination - Plan: POCT CBC, Ambulatory referral to Pulmonology  Cystitis - Plan: Urine culture  Abnormal CT scan, lung - Plan: amoxicillin-clavulanate (AUGMENTIN) 875-125 MG per tablet, Ambulatory referral to Pulmonology  Cough - Plan: amoxicillin-clavulanate (AUGMENTIN) 875-125 MG per tablet, Ambulatory referral to Pulmonology  Opacity of lung on imaging study - Plan: amoxicillin-clavulanate (AUGMENTIN) 875-125 MG per tablet, Ambulatory referral to Pulmonology  I discussed her abnormal lung sounds,  fatigue, and cough with previous abnormal CAT scan concerning for infection vs. neoplasm. In light of her fatigue, I also discussed the results of her most recent CT scan as she initially declined a chest X-Carlson. After discussion of her CT results (including recent progression of the right lower lobe disease and progressive satellite nodules of the right lower lobe) I discussed concern of neoplasm with secondary infection versus worsening infection, and inflammation a less likely possibility. I discussed there could be a post obstructive pneumonia and again recommended  a chest x-Carlson tonight which she declined. Risks of untreated pneumonia discussed including progression of disease or possible death. Understanding expressed.   -Will try to have her seen by pulmonary as soon as possible (she has an appointment as of this point on 09/21/2014).In the meantime will cover with augmentin. Pt started to leave office prior to lab work completion, stating she was tired. Convinced her to return the room for blood draw with results as above.   -Augmentin for possible pneumonia or secondary infection in lungs also for possible early urinary tract infection. Quinolone deferred at present d/t possible side effects at her age, and reassuring CBC.  Urine culture ordered, as well as CMP.  Side effects discussed and return to clinic precautions given in regards to her condition and use of Augmentin.     Meds ordered this encounter  Medications  . losartan (COZAAR) 100 MG tablet    Sig: Take 100 mg by mouth  daily.  . amoxicillin-clavulanate (AUGMENTIN) 875-125 MG per tablet    Sig: Take 1 tablet by mouth 2 (two) times daily.    Dispense:  20 tablet    Refill:  0   Patient Instructions  As we discussed, your fatigue may be due to infection in lungs, especially with recent chest cat scan results we discussed, but I am unable to determine this completely without xray tonight. I am starting a medicine that may help some  with treating a lung infection, but recommend you be seen by lung doctor in next few days or your primary care provider's office. This antibiotic should also cover if there is a urine infection. You should receive a call or letter about your lab results within the next week to 10 days.   Your hemoglobin was a little low, so should be checked by your primary provider in next 2 weeks to make sure it is stable.   Return to the clinic or go to the nearest emergency room if any of your symptoms worsen or new symptoms occur.    I personally performed the services described in this documentation, which was scribed in my presence. The recorded information has been reviewed and considered, and addended by me as needed.

## 2014-09-05 LAB — COMPLETE METABOLIC PANEL WITH GFR
ALK PHOS: 91 U/L (ref 39–117)
ALT: 11 U/L (ref 0–35)
AST: 17 U/L (ref 0–37)
Albumin: 3.8 g/dL (ref 3.5–5.2)
BILIRUBIN TOTAL: 0.9 mg/dL (ref 0.2–1.2)
BUN: 13 mg/dL (ref 6–23)
CO2: 21 mEq/L (ref 19–32)
Calcium: 9 mg/dL (ref 8.4–10.5)
Chloride: 97 mEq/L (ref 96–112)
Creat: 0.79 mg/dL (ref 0.50–1.10)
GFR, EST NON AFRICAN AMERICAN: 67 mL/min
GFR, Est African American: 77 mL/min
GLUCOSE: 102 mg/dL — AB (ref 70–99)
POTASSIUM: 3.9 meq/L (ref 3.5–5.3)
Sodium: 131 mEq/L — ABNORMAL LOW (ref 135–145)
Total Protein: 6.4 g/dL (ref 6.0–8.3)

## 2014-09-07 LAB — URINE CULTURE

## 2014-09-10 ENCOUNTER — Ambulatory Visit (INDEPENDENT_AMBULATORY_CARE_PROVIDER_SITE_OTHER): Payer: Medicare Other | Admitting: Emergency Medicine

## 2014-09-10 VITALS — BP 140/68 | HR 71 | Temp 97.6°F | Resp 16 | Ht 63.0 in | Wt 156.8 lb

## 2014-09-10 DIAGNOSIS — E871 Hypo-osmolality and hyponatremia: Secondary | ICD-10-CM | POA: Diagnosis not present

## 2014-09-10 DIAGNOSIS — R938 Abnormal findings on diagnostic imaging of other specified body structures: Secondary | ICD-10-CM | POA: Diagnosis not present

## 2014-09-10 DIAGNOSIS — R9389 Abnormal findings on diagnostic imaging of other specified body structures: Secondary | ICD-10-CM

## 2014-09-10 LAB — POCT CBC
GRANULOCYTE PERCENT: 69.8 % (ref 37–80)
HEMATOCRIT: 35.3 % — AB (ref 37.7–47.9)
Hemoglobin: 11.6 g/dL — AB (ref 12.2–16.2)
LYMPH, POC: 1.6 (ref 0.6–3.4)
MCH, POC: 31.2 pg (ref 27–31.2)
MCHC: 32.8 g/dL (ref 31.8–35.4)
MCV: 95.2 fL (ref 80–97)
MID (CBC): 0.6 (ref 0–0.9)
MPV: 7 fL (ref 0–99.8)
POC Granulocyte: 5.3 (ref 2–6.9)
POC LYMPH PERCENT: 21.7 %L (ref 10–50)
POC MID %: 8.5 %M (ref 0–12)
Platelet Count, POC: 348 10*3/uL (ref 142–424)
RBC: 3.71 M/uL — AB (ref 4.04–5.48)
RDW, POC: 14.4 %
WBC: 7.6 10*3/uL (ref 4.6–10.2)

## 2014-09-10 LAB — BASIC METABOLIC PANEL
BUN: 13 mg/dL (ref 6–23)
CALCIUM: 9.2 mg/dL (ref 8.4–10.5)
CO2: 23 mEq/L (ref 19–32)
Chloride: 99 mEq/L (ref 96–112)
Creat: 0.79 mg/dL (ref 0.50–1.10)
Glucose, Bld: 139 mg/dL — ABNORMAL HIGH (ref 70–99)
Potassium: 3.9 mEq/L (ref 3.5–5.3)
SODIUM: 133 meq/L — AB (ref 135–145)

## 2014-09-10 NOTE — Progress Notes (Deleted)
   Subjective:    Patient ID: [name] Carlson, female    DOB: May 29, 1923, 81 y.o.   MRN: [id number]  HPI    Review of Systems     Objective:   Physical Exam        Assessment & Plan:

## 2014-09-10 NOTE — Progress Notes (Addendum)
Subjective:  This chart was scribed for Arlyss Queen MD,  by Tamsen Roers, at [facility].  This patient was seen in room 8 and the patient's care was started at 1:52 PM.    Patient ID: [name] Carlson, female    DOB: Nov 23, 1922, 81 y.o.   MRN: [id number]  HPI  HPI Comments: [name] Carlson is a 81 y.o. female who presents to [facility] for a follow up regarding her sodium levels and chest CT.  Patient was here 6 days ago and had blood work done.  She notes she is still experiencing fatigue intermittently.  Her PCP is Dr. Conni Slipper. Patient has no other complaints today.     Patient Active Problem List   Diagnosis Date Noted  . Abnormal chest CT 03/29/2014  . Cough 03/29/2014   Past Medical History  Diagnosis Date  . Hypertension   . External carotid artery stenosis     L side dx'd with doppler 2012  . UTI (lower urinary tract infection)   . Pneumonia   . Thyroid disease    Past Surgical History  Procedure Laterality Date  . Video bronchoscopy Bilateral 04/06/2014    Procedure: VIDEO BRONCHOSCOPY WITH FLUORO;  Surgeon: Juanito Doom, MD;  Location: [facility];  Service: Cardiopulmonary;  Laterality: Bilateral;  . Eye surgery     Allergies  Allergen Reactions  . Corticosteroids   . Minocycline     Pt states became disoriented and had a stiff neck.   Prior to Admission medications   Medication Sig Start Date End Date Taking? Authorizing Provider  amoxicillin-clavulanate (AUGMENTIN) 875-125 MG per tablet Take 1 tablet by mouth 2 (two) times daily. 09/04/14  Yes Wendie Agreste, MD  aspirin 81 MG tablet Take 81 mg by mouth daily.   Yes Historical Provider, MD  docusate sodium (COLACE) 100 MG capsule Take 100 mg by mouth 2 (two) times daily.   Yes Historical Provider, MD  doxazosin (CARDURA) 2 MG tablet Take 4 mg by mouth daily.   Yes Historical Provider, MD  levothyroxine (SYNTHROID, LEVOTHROID) 50 MCG tablet Take 50 mcg  by mouth daily before breakfast.   Yes Historical Provider, MD  losartan (COZAAR) 100 MG tablet Take 100 mg by mouth daily.   Yes Historical Provider, MD  verapamil (VERELAN PM) 240 MG 24 hr capsule Take 240 mg by mouth at bedtime.   Yes Historical Provider, MD   History   Social History  . Marital Status: Widowed    Spouse Name: N/A  . Number of Children: N/A  . Years of Education: N/A   Occupational History  . Not on file.   Social History Main Topics  . Smoking status: Former Smoker -- 1.00 packs/day for 12 years    Types: Cigarettes    Quit date: 06/16/1959  . Smokeless tobacco: Never Used  . Alcohol Use: No  . Drug Use: No  . Sexual Activity: Not on file   Other Topics Concern  . Not on file   Social History Narrative     Review of Systems  Constitutional: Positive for fatigue. Negative for fever and chills.  HENT: Negative for drooling.   Eyes: Negative for discharge.  Gastrointestinal: Negative for nausea and vomiting.       Objective:   Physical Exam CONSTITUTIONAL: Well developed/well nourished HEAD: Normocephalic/atraumatic EYES: EOMI/PERRL ENMT: Mucous membranes moist NECK: supple no meningeal signs SPINE/BACK:entire spine nontender CV: S1/S2 noted, no murmurs/rubs/gallops noted  LUNGS: Lungs are clear to auscultation bilaterally, no apparent distress ABDOMEN: soft, nontender, no rebound or guarding, bowel sounds noted throughout abdomen GU:no cva tenderness NEURO: Pt is awake/alert/appropriate, moves all extremitiesx4.  No facial droop.   EXTREMITIES: pulses normal/equal, full ROM SKIN: warm, color normal PSYCH: no abnormalities of mood noted, alert and oriented to situation Results for orders placed or performed in visit on 09/10/14  POCT CBC  Result Value Ref Range   WBC 7.6 4.6 - 10.2 K/uL   Lymph, poc 1.6 0.6 - 3.4   POC LYMPH PERCENT 21.7 10 - 50 %L   MID (cbc) 0.6 0 - 0.9   POC MID % 8.5 0 - 12 %M   POC Granulocyte 5.3 2 - 6.9    Granulocyte percent 69.8 37 - 80 %G   RBC 3.71 (A) 4.04 - 5.48 M/uL   Hemoglobin 11.6 (A) 12.2 - 16.2 g/dL   HCT, POC 35.3 (A) 37.7 - 47.9 %   MCV 95.2 80 - 97 fL   MCH, POC 31.2 27 - 31.2 pg   MCHC 32.8 31.8 - 35.4 g/dL   RDW, POC 14.4 %   Platelet Count, POC 348 142 - 424 K/uL   MPV 7.0 0 - 99.8 fL     Filed Vitals:   09/10/14 1330  BP: 140/68  Pulse: 71  Temp: 97.6 F (36.4 C)  TempSrc: Oral  Resp: 16  Height: 5\' 3"  (1.6 m)  Weight: 156 lb 12.8 oz (71.124 kg)  SpO2: 91%      Assessment & Plan:   patient was called to return today for repeat blood work. She had hyponatremia with a sodium of 131. 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 She has a follow-up appointment with Dr. Lake Bells  to review these results. I told her her CT was abnormal but did not go into specifics. I personally performed the services described in this documentation, which was scribed in my presence. The recorded information has been reviewed and is accurate.

## 2014-09-11 ENCOUNTER — Encounter: Payer: Self-pay | Admitting: Pulmonary Disease

## 2014-09-11 ENCOUNTER — Ambulatory Visit (INDEPENDENT_AMBULATORY_CARE_PROVIDER_SITE_OTHER): Payer: Medicare Other | Admitting: Pulmonary Disease

## 2014-09-11 VITALS — BP 130/62 | HR 67 | Temp 97.1°F | Ht 62.0 in | Wt 157.2 lb

## 2014-09-11 DIAGNOSIS — R9389 Abnormal findings on diagnostic imaging of other specified body structures: Secondary | ICD-10-CM

## 2014-09-11 DIAGNOSIS — R05 Cough: Secondary | ICD-10-CM

## 2014-09-11 DIAGNOSIS — R059 Cough, unspecified: Secondary | ICD-10-CM

## 2014-09-11 DIAGNOSIS — R938 Abnormal findings on diagnostic imaging of other specified body structures: Secondary | ICD-10-CM | POA: Diagnosis not present

## 2014-09-11 LAB — OSMOLALITY: OSMOLALITY: 277 mosm/kg (ref 275–300)

## 2014-09-11 LAB — OSMOLALITY, URINE: Osmolality, Ur: 213 mOsm/kg — ABNORMAL LOW (ref 390–1090)

## 2014-09-11 MED ORDER — HYDROCODONE-HOMATROPINE 5-1.5 MG/5ML PO SYRP: 5.0000 mL | ORAL_SOLUTION | Freq: Four times a day (QID) | ORAL | Status: DC | PRN

## 2014-09-11 NOTE — Patient Instructions (Signed)
Today we updated your med list in our EPIC system...    Continue your current medications the same...  For your congestion>>     Start the OTC MUCINEX (Guaifenesin) 600mg  tabs- 1 to 2 tabs twice daily w/ fluids...  For your cough>>    You may use the OTC DELSYM cough syrup as needed...    We wrote for a prescription cough syrup- HYCODAN- take one tsp every 6h as needed for cough...  Call for any questions or if i can be of service in any way...  Keep your planned follow up appt w/ DrMcQuaid scheduled for 09/21/14.Marland KitchenMarland Kitchen

## 2014-09-17 ENCOUNTER — Encounter: Payer: Self-pay | Admitting: Pulmonary Disease

## 2014-09-17 ENCOUNTER — Ambulatory Visit: Payer: Self-pay | Admitting: Pulmonary Disease

## 2014-09-17 NOTE — Progress Notes (Signed)
Subjective:    Patient ID: [name] Carlson, female    DOB: 1923/01/12, 81 y.o.   MRN: [id number]  HPI Synopsis: referred to the [facility] in 2015 for evaluation of a growing right lower lobe infiltrate. She underwent a bronchoscopy with transbronchial biopsies and cytology studies area at the biopsies showed normal lung tissue but the brushings showed atypical cell suggestive of malignancy. A modified barium swallow performed in November 2015 did not show evidence of aspiration.  03/29/14 ~ Initial Consult w/ BQ  [name] Carlson is a non-resolving pneumonia syndrome with a persistent right lower lobe infiltrate over the last several months. Her physical exam is consistent with this. What concerns me most is that the infiltrate appears to be growing in size since January 2015. Despite this, she appears fairly well with the exception of cough. I explained to both she and her daughter that the differential diagnosis here is broad and includes an atypical infection, organizing pneumonia, or less likely an inflammatory process or malignancy. Because of this problem has persisted for so long she needs to undergo a bronchoscopy for further evaluation. I think that aspiration is also likely. Plan: -Repeat CT chest to see if there is evidence of mediastinal lymphadenopathy which would change our bronchoscopy approach to an EBUS - Plan for bronchoscopy Friday 10/23 at 0800 at [facility] after midnight 10/22 - CBC, PT/INR now - if bronch work up negative will get barium swallow and start empiric prednisone for organizing pneumonia  Bronchoscopy 04/06/14 by BQ>  Bronch was neg, w/o lesions seen- TBBx RLL= benign lung tissue;  Bronch brushings RLL= atyp cells;  Lavage fluid cytology= atyp cells...  04/17/14 ~ ROV w/ BQ  I am concerned about the cytology results which were suspicious for malignancy. Her lesion has grown this year though she has no other symptoms that are worrisome for malignancy  (weight loss, hemoptysis, etc.). Overall this clinical picture is not consistent with with lung cancer, but I can't ignore this result. I think it is worth ruling out aspiration as well as an atypical infection prior to proceeding with an open lung biopsy. I gave the option of proceeding with surgical biopsy at this point and she has decided not to do that. Plan: -Modified barium swallow -Follow-up in 5 weeks after all culture   05/29/14 ~ OV w/ BQ Patient presents with  . Follow-up    pt has no breathing complaints. Review barium swallow.   [name] Carlson says that the cough has persisted since the last visit. She does not frequently cough up mucus. It only typically occurs when she is up and moving around and remaining active. She says that it rarely occurs when she lies flat. She has not had problems swallowing. It has not been associated with eating to her knowledge. She has not had fevers or chills. Her weight has been stable. She denies shortness of breath.       09/11/14 ~ Add-on w/ SN Chief Complaint  Patient presents with  . Acute Visit    Pt of DrMcQuaid> add-on for feeling poorly & wants results of CT scan...  Pt is here w/ her daughter to discuss recent CT Chest & c/o recent increased lethargy & ahedonia; states she has not been feeling like herself for the last 2-3 weeks; she went to [facility] x2 over the past 2wks (both notes reviewed by me) & was Dx w/ UTI (her Primary Care is Dr. Zada Girt) and she was started on Augmentin;  they noted a low sodium (at 131) and mild anemia (Hg=10.8); they thought that she prob had SIADH related to her abn CT scan that had prev been ordered by BQ; Pt & daughter were concerned about this constellation of findings and did not want to wait until ROV w/ BQ on 09/21/14' NOTE- she is already starting to feel better after starting the antibiotic for the UTI...   She relates a hx of pneumonia 05/2013 & treated by DrGreen at that time; she notes a persistent cough, sm  amt clear sputum, no hemoptysis; she has DOE & some increased SOB now at 81 y/o (daughter notes that the cough & SOB have progressed & she is no longer going to church due to this); she has a persistent & slowly progressive RLL inhomogeneous opacity that has not resolved after antibiotics and a several months course of tapering prednisone (finished 08/23/14); I have reviewed the above data w/ the pt and her daughter & spent 45 min face to face time explaining the plan as outlined by [facility] and her treatment options...   EXAM reveals Afeb, VSS, O2sat on RA at rest= 92%;  Chest- right>left basilar crackles, no wheezes/ rhonchi/ or signs of consolidation;  Cardia- RR, gr1/6 SEM, no rubs or gallops, no edema...  We reviewed prob list, meds, xrays and labs>   Last CXR 03/2014 showed mild cardiomegaly, RLL opacity persists...  MBS 11/15 was essentially WNL...  CT Chest 08/31/14 showed interval progression of RLL disease w/ new & progressing satellite nodules...  Ambulatory O2 sat test> O2sat on RA at rest= 94% w/ pulse 62/min;  Walked 2 laps & stopped due to LBP- lowest O2 sat= 92% w/ pulse 80/min...  She has not yet had PFTs/ operative assessment  PLAN>> we reviewed the serial findings and the rationale for antibiotic & corticosteroid treatment she's received; unfortunately this lesion has progressed making the likelihood of underlying malignancy greater than first hoped; they might be interested in VATS resection and they will consider their options over the next week & keep her sched f/u appt w/ BQ on 09/21/14, Rx written for Hycodan...   Past Medical History  Diagnosis Date  . Hypertension   . External carotid artery stenosis     L side dx'd with doppler 2012  . UTI (lower urinary tract infection)   . Pneumonia   . Thyroid disease    Past Surgical History  Procedure Laterality Date  . Video bronchoscopy Bilateral 04/06/2014    Procedure: VIDEO BRONCHOSCOPY WITH FLUORO;  Surgeon: Juanito Doom, MD;  Location: [facility];  Service: Cardiopulmonary;  Laterality: Bilateral;  . Eye surgery     Outpatient Encounter Prescriptions as of 09/11/2014  Medication Sig  . amoxicillin-clavulanate (AUGMENTIN) 875-125 MG per tablet Take 1 tablet by mouth 2 (two) times daily.  [name] aspirin 81 MG tablet Take 81 mg by mouth daily.  [name] docusate sodium (COLACE) 100 MG capsule Take 100 mg by mouth 2 (two) times daily.  [name] doxazosin (CARDURA) 2 MG tablet Take 4 mg by mouth daily.  [name] levothyroxine (SYNTHROID, LEVOTHROID) 50 MCG tablet Take 50 mcg by mouth daily before breakfast.  . losartan (COZAAR) 100 MG tablet Take 100 mg by mouth daily.  . verapamil (VERELAN PM) 240 MG 24 hr capsule Take 240 mg by mouth at bedtime.  [name] HYDROcodone-homatropine (HYCODAN) 5-1.5 MG/5ML syrup Take 5 mLs by mouth every 6 (six) hours as needed for cough.   Allergies  Allergen Reactions  . Corticosteroids   .  Minocycline     Pt states became disoriented and had a stiff neck.   Current Medications, Allergies, Past Medical History, Past Surgical History, Family History, and Social History were reviewed in [employer] record.   Review of Systems  Constitutional: Negative for fever, chills and fatigue.  HENT: Negative for postnasal drip, rhinorrhea and sinus pressure.   Respiratory: Positive for cough. Negative for shortness of breath and wheezing.   Cardiovascular: Negative for chest pain, palpitations and leg swelling.      Objective:   Physical Exam  Filed Vitals:   09/11/14 1710  BP: 130/62  Pulse: 67  Temp: 97.1 F (36.2 C)  TempSrc: Oral  Height: 5\' 2"  (1.575 m)  Weight: 157 lb 3.2 oz (71.305 kg)  SpO2: 92%  RA  Gen: well appearing, no acute distress HEENT: NCAT,  EOMi, OP clear, PULM: Crackles in bases bilaterally R>L CV: RRR, no mgr, no JVD AB: BS+, soft, nontender Ext: warm, no edema, no clubbing, no cyanosis Derm: no rash or skin breakdown Neuro: A&Ox4, MAEW       Assessment & Plan:    81 y/o woman w/ persistent & progressive RLL opacity on CXR/ CT scans raising concern for underlying malignancy; we reviewed the data in detail & I answered all their questions to the best of my ability; they will consider their options & return for further discussion & poss pre-op evaluation including PFTs, EKG, & 2DEcho...  PLAN>> we reviewed the serial findings and the rationale for antibiotic & corticosteroid treatment she's received; unfortunately this lesion has progressed making the likelihood of underlying malignancy greater than first hoped; they might be interested in VATS resection and they will consider their options over the next week & keep her sched f/u appt w/ BQ on 09/21/14, Rx written for Hycodan   Patient's Medications  New Prescriptions   HYDROCODONE-HOMATROPINE (HYCODAN) 5-1.5 MG/5ML SYRUP    Take 5 mLs by mouth every 6 (six) hours as needed for cough.  Previous Medications   AMOXICILLIN-CLAVULANATE (AUGMENTIN) 875-125 MG PER TABLET    Take 1 tablet by mouth 2 (two) times daily.   ASPIRIN 81 MG TABLET    Take 81 mg by mouth daily.   DOCUSATE SODIUM (COLACE) 100 MG CAPSULE    Take 100 mg by mouth 2 (two) times daily.   DOXAZOSIN (CARDURA) 2 MG TABLET    Take 4 mg by mouth daily.   LEVOTHYROXINE (SYNTHROID, LEVOTHROID) 50 MCG TABLET    Take 50 mcg by mouth daily before breakfast.   LOSARTAN (COZAAR) 100 MG TABLET    Take 100 mg by mouth daily.   VERAPAMIL (VERELAN PM) 240 MG 24 HR CAPSULE    Take 240 mg by mouth at bedtime.  Modified Medications   No medications on file  Discontinued Medications   No medications on file

## 2014-09-21 ENCOUNTER — Encounter: Payer: Self-pay | Admitting: Pulmonary Disease

## 2014-09-21 ENCOUNTER — Ambulatory Visit (INDEPENDENT_AMBULATORY_CARE_PROVIDER_SITE_OTHER): Payer: Medicare Other | Admitting: Pulmonary Disease

## 2014-09-21 ENCOUNTER — Other Ambulatory Visit (INDEPENDENT_AMBULATORY_CARE_PROVIDER_SITE_OTHER): Payer: Medicare Other

## 2014-09-21 VITALS — BP 122/52 | HR 82 | Wt 152.0 lb

## 2014-09-21 DIAGNOSIS — R938 Abnormal findings on diagnostic imaging of other specified body structures: Secondary | ICD-10-CM | POA: Diagnosis not present

## 2014-09-21 DIAGNOSIS — R5383 Other fatigue: Secondary | ICD-10-CM | POA: Diagnosis not present

## 2014-09-21 DIAGNOSIS — R9389 Abnormal findings on diagnostic imaging of other specified body structures: Secondary | ICD-10-CM

## 2014-09-21 LAB — TSH: TSH: 1.96 u[IU]/mL (ref 0.35–4.50)

## 2014-09-21 NOTE — Assessment & Plan Note (Signed)
Unfortunately the right lower lobe lesion has increased in size. I took the time to review the images today with her son. She also has pulmonary nodules in the right lower lobe which are growing. I am very concerned that this represents a malignancy considering the fact that it is not responded to antibiotics, steroids, she has no evidence of aspiration, and she has worsening systemic, generalized symptoms. She and her family have been reluctant to undergo surgery up until this point and have chosen a conservative path. However, this process has progressed despite conservative measures. I explained to them in great detail today that I'm very concerned that this represents a malignancy.  They prefer to make decisions jointly and so they're going to go home this weekend and talk about having a surgical biopsy at length. She is 68 and has some dyspnea and cough but I do not feel that she is at excessive risk for a thorascopic biopsy.  Plan: -She and her family are going to consider surgery over the weekend and then let me know their plans on Monday. -If they decide to have a biopsy then we will referred them to Dr. Roxy Horseman at their request.

## 2014-09-21 NOTE — Patient Instructions (Signed)
Go home and talk among your family about whether or not you want to have the surgery Let me know on Monday

## 2014-09-21 NOTE — Assessment & Plan Note (Signed)
This is a worsening problem for her recently. I explained to them that the differential diagnosis includes hypothyroidism and they're concerned about the possibility of a mitral valve disorder considering the fact that she  had rheumatic fever as a child. She does have a systolic murmur but I do not feel strongly that she has evidence of a profound cardiac abnormality. I feel that the fatigue is likely due to progressive malignancy as detailed above.  Plan: -Check TSH and echocardiogram

## 2014-09-21 NOTE — Addendum Note (Signed)
Addended by: Parke Poisson E on: 09/21/2014 02:50 PM   Modules accepted: Orders

## 2014-09-21 NOTE — Progress Notes (Signed)
Subjective:    Patient ID: [name] Carlson, female    DOB: 05-12-1923, 81 y.o.   MRN: [id number]  Synopsis: referred to the [facility] in 2015 for evaluation of a growing right lower lobe infiltrate. She underwent a bronchoscopy with transbronchial biopsies and cytology studies area at the biopsies showed normal lung tissue but the brushings showed atypical cell suggestive of malignancy. A modified barium swallow performed in November 2015 did not show evidence of aspiration.  HPI  Chief Complaint  Patient presents with  . Follow-up    cough, prod at times w/clear mucus but better; little energy; SOB w/activity; states no ambition   [name] Carlson saw my partner last week and was told to come back to see me today to discuss her CT scan.  She is accompanied by her son today who provides part of the history.  She states that since my last visit with her her dyspnea has progressed to a moderate level and now going to the grocery store is difficult as is performing activities in the house. She has noted increasing cough as well but this is not productive of mucus. Her weight has been stable.  She has been more fatigued overall.  The fatigue has been progressing since the last visit. She has sleeping well. She has not been taking new medications. She is not taking the cough medications. Her appetite has been stable recently.  Past Medical History  Diagnosis Date  . Hypertension   . External carotid artery stenosis     L side dx'd with doppler 2012  . UTI (lower urinary tract infection)   . Pneumonia   . Thyroid disease      Review of Systems  Constitutional: Positive for fatigue. Negative for fever and chills.  HENT: Negative for postnasal drip, rhinorrhea and sinus pressure.   Respiratory: Positive for cough and shortness of breath. Negative for wheezing.   Cardiovascular: Negative for chest pain, palpitations and leg swelling.       Objective:   Physical Exam Filed Vitals:   09/21/14 1358  BP: 122/52  Pulse: 82  Weight: 152 lb (68.947 kg)  SpO2: 97%  RA  Gen: well appearing, no acute distress HEENT: NCAT,  EOMi, OP clear, PULM: Crackles in bases bilaterally CV: RRR, no mgr, no JVD AB: BS+, soft, nontender Ext: warm, no edema, no clubbing, no cyanosis Derm: no rash or skin breakdown Neuro: A&Ox4, MAEW  08/2014 CT chest >  Images reviewed by me IMPRESSION: Interval progression of confluent right lower lobe disease with new and progressing satellite nodules in the right lower lobe. Imaging features are concerning for neoplasm with worsening infection/inflammation considered a less likely possibility.      Assessment & Plan:   Abnormal chest CT Unfortunately the right lower lobe lesion has increased in size. I took the time to review the images today with her son. She also has pulmonary nodules in the right lower lobe which are growing. I am very concerned that this represents a malignancy considering the fact that it is not responded to antibiotics, steroids, she has no evidence of aspiration, and she has worsening systemic, generalized symptoms. She and her family have been reluctant to undergo surgery up until this point and have chosen a conservative path. However, this process has progressed despite conservative measures. I explained to them in great detail today that I'm very concerned that this represents a malignancy.  They prefer to make decisions jointly and so they're going to go home  this weekend and talk about having a surgical biopsy at length. She is 52 and has some dyspnea and cough but I do not feel that she is at excessive risk for a thorascopic biopsy.  Plan: -She and her family are going to consider surgery over the weekend and then let me know their plans on Monday. -If they decide to have a biopsy then we will referred them to Dr. Roxy Horseman at their request.   Fatigue This is a worsening problem for her recently. I explained to them  that the differential diagnosis includes hypothyroidism and they're concerned about the possibility of a mitral valve disorder considering the fact that she  had rheumatic fever as a child. She does have a systolic murmur but I do not feel strongly that she has evidence of a profound cardiac abnormality. I feel that the fatigue is likely due to progressive malignancy as detailed above.  Plan: -Check TSH and echocardiogram    > 25 minutes spent in direct consultation today  Updated Medication List Outpatient Encounter Prescriptions as of 09/21/2014  Medication Sig  . aspirin 81 MG tablet Take 81 mg by mouth daily.  [name] doxazosin (CARDURA) 2 MG tablet Take 4 mg by mouth daily.  [name] levothyroxine (SYNTHROID, LEVOTHROID) 50 MCG tablet Take 50 mcg by mouth daily before breakfast.  . losartan (COZAAR) 100 MG tablet Take 100 mg by mouth daily.  . verapamil (VERELAN PM) 240 MG 24 hr capsule Take 240 mg by mouth at bedtime.  . [DISCONTINUED] amoxicillin-clavulanate (AUGMENTIN) 875-125 MG per tablet Take 1 tablet by mouth 2 (two) times daily. (Patient not taking: Reported on 09/21/2014)  . [DISCONTINUED] docusate sodium (COLACE) 100 MG capsule Take 100 mg by mouth 2 (two) times daily.  . [DISCONTINUED] HYDROcodone-homatropine (HYCODAN) 5-1.5 MG/5ML syrup Take 5 mLs by mouth every 6 (six) hours as needed for cough. (Patient not taking: Reported on 09/21/2014)

## 2014-09-24 ENCOUNTER — Telehealth: Payer: Self-pay | Admitting: Pulmonary Disease

## 2014-09-24 DIAGNOSIS — R911 Solitary pulmonary nodule: Secondary | ICD-10-CM

## 2014-09-24 NOTE — Telephone Encounter (Signed)
Spoke with Manus Gunning (pt's daughter-in-law), states that pt wishes to proceed with the biopsy discussed at Friday's ov, but wishes to consult with Dr. Servando Snare before having the biopsy.    Dr. Bebe Shaggy already placed the referral based on Friday's note, forwarding just fyi.  Nothing further needed.

## 2014-09-24 NOTE — Telephone Encounter (Signed)
thanks

## 2014-09-26 ENCOUNTER — Ambulatory Visit (HOSPITAL_COMMUNITY): Payer: Medicare Other | Attending: Pulmonary Disease | Admitting: Cardiology

## 2014-09-26 ENCOUNTER — Other Ambulatory Visit (HOSPITAL_COMMUNITY): Payer: Self-pay | Admitting: Pulmonary Disease

## 2014-09-26 DIAGNOSIS — R5383 Other fatigue: Secondary | ICD-10-CM

## 2014-09-26 DIAGNOSIS — R5382 Chronic fatigue, unspecified: Secondary | ICD-10-CM | POA: Insufficient documentation

## 2014-09-26 DIAGNOSIS — Z79899 Other long term (current) drug therapy: Secondary | ICD-10-CM

## 2014-09-26 NOTE — Progress Notes (Signed)
Echo performed. 

## 2014-10-02 ENCOUNTER — Institutional Professional Consult (permissible substitution) (INDEPENDENT_AMBULATORY_CARE_PROVIDER_SITE_OTHER): Payer: Medicare Other | Admitting: Cardiothoracic Surgery

## 2014-10-02 ENCOUNTER — Other Ambulatory Visit: Payer: Self-pay | Admitting: *Deleted

## 2014-10-02 ENCOUNTER — Encounter: Payer: Self-pay | Admitting: Cardiothoracic Surgery

## 2014-10-02 VITALS — BP 106/58 | HR 60 | Resp 20 | Ht 62.0 in | Wt 152.0 lb

## 2014-10-02 DIAGNOSIS — R918 Other nonspecific abnormal finding of lung field: Secondary | ICD-10-CM | POA: Diagnosis not present

## 2014-10-02 DIAGNOSIS — R911 Solitary pulmonary nodule: Secondary | ICD-10-CM

## 2014-10-02 NOTE — Progress Notes (Signed)
[address]       [place] [address]             [phone]                    [name] Carlson [place] Medical Record #[id number] Date of Birth: [birth year]  Referring: [name] Doom, MD Primary Care: [name] Peaches, MD  Chief Complaint:    Chief Complaint  Patient presents with  . Lung Lesion    Surgical eval on multipule right lobe lung noduels, Chest CT 08/31/14    History of Present Illness:    [name] Carlson 81 y.o. female is seen in the office  today for infiltrative process first noted on CT of chest 06/2013 , progressive starting in right lower lobe and now beginning in left lung. Small pulmonary nodules in right lung have increased 4 mm to 6 mm. Bronchoscopy was done in Oct 2015 : Diagnosis BRONCHIAL LAVAGE RIGHT LOWER, (SPECIMEN 1 OF 2, COLLECTED ON 04/06/14): ATYPICAL CELLS PRESENT SEE COMMENT COMMENT: THERE ARE RARE ATYPICAL CELLS PRESENT THAT ARE NOT DIAGNOSTIC OF MALIGNANCY. [name] RUND DO Pathologist, Electronic Signature       Current Activity/ Functional Status:  Patient is independent with mobility/ambulation, transfers, ADL's, IADL's. lives alone, but over last 3-4 weeks increased sob has limited her   Zubrod Score: At the time of surgery this patient's most appropriate activity status/level should be described as: '[]'$     0    Normal activity, no symptoms '[]'$     1    Restricted in physical strenuous activity but ambulatory, able to do out light work '[x]'$     2    Ambulatory and capable of self care, unable to do work activities, up and about               >50 % of waking hours                              '[]'$     3    Only limited self care, in bed greater than 50% of waking hours '[]'$     4    Completely disabled, no self care, confined to bed or chair '[]'$     5    Moribund   Past Medical History  Diagnosis Date  . Hypertension   . External carotid artery stenosis     L side dx'd with doppler 2012  . UTI (lower urinary tract  infection)   . Pneumonia   . Thyroid disease     Past Surgical History  Procedure Laterality Date  . Video bronchoscopy Bilateral 04/06/2014    Procedure: VIDEO BRONCHOSCOPY WITH FLUORO;  Surgeon: [name] Doom, MD;  Location: [facility];  Service: Cardiopulmonary;  Laterality: Bilateral;  . Eye surgery      Family History  Problem Relation Age of Onset  . Emphysema Father     History   Social History  . Marital Status: Widowed    Spouse Name: N/A  . Number of Children: N/A  . Years of Education: N/A   Occupational History  . Not on file.   Social History Main Topics  . Smoking status: Former Smoker -- 1.00 packs/day for 12 years    Types: Cigarettes    Quit date: 06/16/1959  . Smokeless tobacco: Never Used  . Alcohol Use: No  . Drug Use: No  .  Sexual Activity: Not on file   Other Topics Concern  . Not on file   Social History Narrative    History  Smoking status  . Former Smoker -- 1.00 packs/day for 12 years  . Types: Cigarettes  . Quit date: 06/16/1959  Smokeless tobacco  . Never Used    History  Alcohol Use No     Allergies  Allergen Reactions  . Corticosteroids   . Minocycline     Pt states became disoriented and had a stiff neck.    Current Outpatient Prescriptions  Medication Sig Dispense Refill  . aspirin 81 MG tablet Take 81 mg by mouth daily.    [name] doxazosin (CARDURA) 2 MG tablet Take 4 mg by mouth daily.    [name] levothyroxine (SYNTHROID, LEVOTHROID) 50 MCG tablet Take 50 mcg by mouth daily before breakfast.    . losartan (COZAAR) 100 MG tablet Take 100 mg by mouth daily.    . verapamil (VERELAN PM) 240 MG 24 hr capsule Take 240 mg by mouth at bedtime.     No current facility-administered medications for this visit.      Review of Systems:     Cardiac Review of Systems: Y or N  Chest Pain [ n  ]  Resting SOB Blue.Reese   ] Exertional SOB  Blue.Reese  ]  Vertell Limber Florencio.Farrier  ]   Pedal Edema [ n  ]    Palpitations [ n ] Syncope  [n  ]   Presyncope [   n ]  General Review of Systems: [Y] = yes [  ]=no Constitional: recent weight change [  ];  Wt loss over the last 3 months [   ] anorexia [  ]; fatigue [  ]; nausea [  ]; night sweats [  ]; fever [  ]; or chills [  ];          Dental: poor dentition[ n ]; Last Dentist visit:   Eye : blurred vision [  ]; diplopia [   ]; vision changes [  ];  Amaurosis fugax[  ]; Resp: cough Blue.Reese  ];  wheezing[ y ];  hemoptysis[ n ]; shortness of breath[ y ]; paroxysmal nocturnal dyspnea[ y ]; dyspnea on exertion[y  ]; or orthopnea[  ];  GI:  gallstones[  ], vomiting[  ];  dysphagia[  ]; melena[  ];  hematochezia [  ]; heartburn[  ];   Hx of  Colonoscopy[  ]; GU: kidney stones [  ]; hematuria[  ];   dysuria [  ];  nocturia[  ];  history of     obstruction [  ]; urinary frequency [  ]             Skin: rash, swelling[  ];, hair loss[  ];  peripheral edema[  ];  or itching[  ]; Musculosketetal: myalgias[  ];  joint swelling[  ];  joint erythema[  ];  joint pain[  ];  back pain[  ];  Heme/Lymph: bruising[  ];  bleeding[  ];  anemia[  ];  Neuro: TIA[  ];  headaches[  ];  stroke[  ];  vertigo[  ];  seizures[  ];   paresthesias[  ];  difficulty walking[  ];  Psych:depression[  ]; anxiety[  ];  Endocrine: diabetes[  ];  thyroid dysfunction[  ];  Immunizations: Flu up to date [  ]; Pneumococcal up to date [  ];  Other:  Physical Exam: BP 106/58 mmHg  Pulse  60  Resp 20  Ht '5\' 2"'$  (1.575 m)  Wt 152 lb (68.947 kg)  BMI 27.79 kg/m2  SpO2 95%  PHYSICAL EXAMINATION: General appearance: alert, cooperative and appears stated age Head: Normocephalic, without obvious abnormality, atraumatic Neck: no adenopathy, no carotid bruit, no JVD, supple, symmetrical, trachea midline and thyroid not enlarged, symmetric, no tenderness/mass/nodules Lymph nodes: Cervical, supraclavicular, and axillary nodes normal. Resp: diminished breath sounds RLL Back: symmetric, no curvature. ROM normal. No CVA tenderness. Cardio: regular rate and  rhythm, S1, S2 normal, no murmur, click, rub or gallop GI: soft, non-tender; bowel sounds normal; no masses,  no organomegaly Extremities: extremities normal, atraumatic, no cyanosis or edema and Homans sign is negative, no sign of DVT Neurologic: Grossly normal  Diagnostic Studies & Laboratory data:     Recent Radiology Findings:  CLINICAL DATA: Subsequent encounter for right lower lobe consolidation. Previous bronchoscopy showed "Atypical cells suggestive of malignancy" .  EXAM: CT CHEST WITHOUT CONTRAST  TECHNIQUE: Multidetector CT imaging of the chest was performed following the standard protocol without IV contrast.  COMPARISON: Chest CT from 04/02/2014. Chest x-ray from 04/06/2014.  FINDINGS: Mediastinum / Lymph Nodes: There is no axillary lymphadenopathy. No mediastinal or hilar lymphadenopathy. The heart size is upper normal. Coronary artery calcification is noted. No pericardial effusion.  Lungs / Pleura: The posterior right lower lobe confluent airspace disease appears slightly progressive in the interval. Pulmonary nodules in the right lower lobe have progressed. 3 mm right lower lobe nodule on the prior study is now 5 mm (image 33 series 3).  MSK / Soft Tissues: Bone windows reveal no worrisome lytic or sclerotic osseous lesions.  Upper Abdomen: No adrenal nodule or mass.  IMPRESSION: Interval progression of confluent right lower lobe disease with new and progressing satellite nodules in the right lower lobe. Imaging features are concerning for neoplasm with worsening infection/inflammation considered a less likely possibility.   Electronically Signed  By: Misty Stanley M.D.  On: 08/31/2014 16:20 CLINICAL DATA: 81 year old female with history of persistent cough and right lower lobe infiltrate since January 2015.  EXAM: CT CHEST WITHOUT CONTRAST  TECHNIQUE: Multidetector CT imaging of the chest was performed following the standard  protocol without IV contrast.  COMPARISON: Chest CT 02/06/2014.  FINDINGS: Mediastinum: Heart size is mildly enlarged. There is no significant pericardial fluid, thickening or pericardial calcification. There is atherosclerosis of the thoracic aorta, the great vessels of the mediastinum and the coronary arteries, including calcified atherosclerotic plaque in the left anterior descending and left circumflex coronary arteries. Calcifications of the mitral annulus and mitral sub valvular apparatus. No pathologically enlarged mediastinal or hilar lymph nodes. Please note that accurate exclusion of hilar adenopathy is limited on noncontrast CT scans. Esophagus is unremarkable in appearance.  Lungs/Pleura: Again noted is extensive airspace consolidation, predominantly in the dependent portion of the basal segments of the right lower lobe. This is in a similar distribution to prior examinations, but appears slightly more extensive than study 02/06/2014, and has clearly increased compared to more remote prior CT examination 06/23/2013. On soft tissue windows there are some areas of relative low attenuation (-10 HU) interspersed with areas of higher attenuation (27 HU). This is of uncertain etiology and significance, but could suggest a lipoid component. At the margins of the airspace consolidation there is ground-glass attenuation and septal thickening. Patchy areas of mild subpleural reticulation are also seen scattered throughout the periphery of the lungs bilaterally. No pleural effusions. Two small nodules are also noted in the right lower  lobe, separate from the consolidative changes, measuring 3 and 4 mm on image 36 and 35 of series 3 respectively. These are similar to the most recent prior examination, but are new compared to the more remote prior CT examination from 06/23/2013. Mild paraseptal emphysema. Mild diffuse bronchial wall thickening.  Upper Abdomen:  Unremarkable.  Musculoskeletal: There are no aggressive appearing lytic or blastic lesions noted in the visualized portions of the skeleton.  IMPRESSION: 1. Persistent airspace consolidation in the right lower lobe is unusual in appearance. Differential considerations include both benign processes such as lipoid pneumonia from chronic aspiration, and malignant processes such as an invasive adenocarcinoma. Correlation with bronchoscopy is recommended. 2. There is a pattern of mild subpleural reticulation throughout the periphery of the lungs bilaterally, which is nonspecific, but could indicate early changes of interstitial lung disease. Attention on followup studies is recommended, with consideration for repeat high-resolution chest CT in 1 year to assess for temporal changes in the appearance of the lung parenchyma if clinically appropriate. 3. Mild diffuse bronchial wall thickening with mild paraseptal emphysema. 4. Mild cardiomegaly. 5. Atherosclerosis, including 2 vessel coronary artery disease. Please note that although the presence of coronary artery calcium documents the presence of coronary artery disease, the severity of this disease and any potential stenosis cannot be assessed on this non-gated CT examination. Assessment for potential risk factor modification, dietary therapy or pharmacologic therapy may be warranted, if clinically indicated. These results were called by telephone at the time of interpretation on 04/02/2014 at 5:26 pm to Dr. Simonne Maffucci, who verbally acknowledged these results.   Electronically Signed  By: Vinnie Langton M.D.  On: 04/02/2014 17:30  I have independently reviewed the above radiology studies  and reviewed the findings with the patient.     08/29/2014                                                                                                   03/2014  Recent Lab Findings: Lab Results  Component Value Date   WBC  7.6 09/10/2014   HGB 11.6* 09/10/2014   HCT 35.3* 09/10/2014   PLT 198.0 03/29/2014   GLUCOSE 139* 09/10/2014   ALT 11 09/04/2014   AST 17 09/04/2014   NA 133* 09/10/2014   K 3.9 09/10/2014   CL 99 09/10/2014   CREATININE 0.79 09/10/2014   BUN 13 09/10/2014   CO2 23 09/10/2014   TSH 1.96 09/21/2014   INR 1.1* 03/29/2014      Assessment / Plan:   Progressive Infiltrative/consodative process progressive over 14 months with now increased SOB. Not clear malignancy. Several small pulmonary nodules noted on right increased slightly in size. Patient come to office for opinion about biopsy. With her age she and her family are concerned about any major operative procedure. Will obtain PFTS, and discuss case at [place] with IR radiology and pulmonary about tissue DX. Not clearly malignancy but could be slow growing adeno with atypical spread vs inflammatory lung process. Will see back next week after PFT done     I  spent 40 minutes counseling the  patient face to face and 50% or more the  time was spent in counseling and coordination of care. The total time spent in the appointment was 60 minutes.  Grace Isaac MD      [address] [place] [address] Office [phone]   Beeper [phone]  10/02/2014 3:23 PM

## 2014-10-10 ENCOUNTER — Ambulatory Visit (HOSPITAL_COMMUNITY)
Admission: RE | Admit: 2014-10-10 | Discharge: 2014-10-10 | Disposition: A | Discharge status: Home / Self Care | Payer: Medicare Other | Source: Ambulatory Visit | Attending: Cardiothoracic Surgery | Admitting: Cardiothoracic Surgery

## 2014-10-10 DIAGNOSIS — J984 Other disorders of lung: Secondary | ICD-10-CM | POA: Insufficient documentation

## 2014-10-10 DIAGNOSIS — R911 Solitary pulmonary nodule: Secondary | ICD-10-CM

## 2014-10-10 LAB — PULMONARY FUNCTION TEST
DL/VA % pred: 72 %
DL/VA: 3.38 ml/min/mmHg/L
DLCO unc % pred: 37 %
DLCO unc: 8.67 ml/min/mmHg
FEF 25-75 Post: 3.17 L/sec
FEF 25-75 Pre: 2.15 L/sec
FEF2575-%Change-Post: 47 %
FEF2575-%Pred-Post: 372 %
FEF2575-%Pred-Pre: 252 %
FEV1-%Change-Post: 15 %
FEV1-%Pred-Post: 108 %
FEV1-%Pred-Pre: 93 %
FEV1-Post: 1.63 L
FEV1-Pre: 1.41 L
FEV1FVC-%Change-Post: -15 %
FEV1FVC-%Pred-Pre: 138 %
FEV6-%Change-Post: 36 %
FEV6-%Pred-Post: 100 %
FEV6-%Pred-Pre: 73 %
FEV6-Post: 1.92 L
FEV6-Pre: 1.41 L
FEV6FVC-%Pred-Post: 107 %
FEV6FVC-%Pred-Pre: 107 %
FVC-%Change-Post: 36 %
FVC-%Pred-Post: 93 %
FVC-%Pred-Pre: 68 %
FVC-Post: 1.92 L
FVC-Pre: 1.41 L
Post FEV1/FVC ratio: 85 %
Post FEV6/FVC ratio: 100 %
Pre FEV1/FVC ratio: 100 %
Pre FEV6/FVC Ratio: 100 %
RV % pred: 65 %
RV: 1.66 L
TLC % pred: 74 %
TLC: 3.66 L

## 2014-10-10 MED ORDER — ALBUTEROL SULFATE (2.5 MG/3ML) 0.083% IN NEBU
2.5000 mg | INHALATION_SOLUTION | Freq: Once | RESPIRATORY_TRACT | Status: AC
  Administered 2014-10-10: 2.5 mg via RESPIRATORY_TRACT

## 2014-10-11 ENCOUNTER — Encounter: Payer: Self-pay | Admitting: Cardiothoracic Surgery

## 2014-10-11 ENCOUNTER — Ambulatory Visit (INDEPENDENT_AMBULATORY_CARE_PROVIDER_SITE_OTHER): Payer: Medicare Other | Admitting: Cardiothoracic Surgery

## 2014-10-11 VITALS — Ht 62.0 in | Wt 150.0 lb

## 2014-10-11 DIAGNOSIS — R918 Other nonspecific abnormal finding of lung field: Secondary | ICD-10-CM | POA: Diagnosis not present

## 2014-10-11 NOTE — Progress Notes (Signed)
[address]       [place] [address]             [phone]                    Kyllie R Kham [facility] Medical Record #[id number] Date of Birth: [birth year]  Referring: Juanito Doom, MD Primary Care: Criselda Peaches, MD  Chief Complaint:    Chief Complaint  Patient presents with  . Lung Lesion    f/u to discuss PFT's from 10/10/14    History of Present Illness:    AVIGAYIL TON 81 y.o. female is seen in the office last week for infiltrative process first noted on CT of chest 06/2013 , progressive starting in right lower lobe and now beginning in left lung. Small pulmonary nodules in right lung have increased 4 mm to 6 mm. Bronchoscopy was done in Oct 2015 : Diagnosis BRONCHIAL LAVAGE RIGHT LOWER, (SPECIMEN 1 OF 2, COLLECTED ON 04/06/14): ATYPICAL CELLS PRESENT SEE COMMENT COMMENT: THERE ARE RARE ATYPICAL CELLS PRESENT THAT ARE NOT DIAGNOSTIC OF MALIGNANCY. [name] DO Pathologist, Electronic Signature   Since seen last week the patient had pulmonary function studies revealing an FEV1 of 98% of predicted with positive response with bronchodilators and a diffusing capacity of 38% of predicted. At rest she has O2 sats ration 97%.    Current Activity/ Functional Status:  Patient is independent with mobility/ambulation, transfers, ADL's, IADL's. lives alone, but over last 3-4 weeks increased sob has limited her   Zubrod Score: At the time of surgery this patient's most appropriate activity status/level should be described as: '[]'$     0    Normal activity, no symptoms '[]'$     1    Restricted in physical strenuous activity but ambulatory, able to do out light work '[x]'$     2    Ambulatory and capable of self care, unable to do work activities, up and about               >50 % of waking hours                              '[]'$     3    Only limited self care, in bed greater than 50% of waking hours '[]'$     4    Completely disabled, no self care, confined to  bed or chair '[]'$     5    Moribund   Past Medical History  Diagnosis Date  . Hypertension   . External carotid artery stenosis     L side dx'd with doppler 2012  . UTI (lower urinary tract infection)   . Pneumonia   . Thyroid disease     Past Surgical History  Procedure Laterality Date  . Video bronchoscopy Bilateral 04/06/2014    Procedure: VIDEO BRONCHOSCOPY WITH FLUORO;  Surgeon: Juanito Doom, MD;  Location: [facility];  Service: Cardiopulmonary;  Laterality: Bilateral;  . Eye surgery      Family History  Problem Relation Age of Onset  . Emphysema Father     History   Social History  . Marital Status: Widowed    Spouse Name: N/A  . Number of Children: N/A  . Years of Education: N/A   Occupational History  . Not on file.   Social History Main Topics  . Smoking status: Former Smoker -- 1.00 packs/day  for 12 years    Types: Cigarettes    Quit date: 06/16/1959  . Smokeless tobacco: Never Used  . Alcohol Use: No  . Drug Use: No  . Sexual Activity: Not on file     History  Smoking status  . Former Smoker -- 1.00 packs/day for 12 years  . Types: Cigarettes  . Quit date: 06/16/1959  Smokeless tobacco  . Never Used    History  Alcohol Use No     Allergies  Allergen Reactions  . Corticosteroids   . Minocycline     Pt states became disoriented and had a stiff neck.    Current Outpatient Prescriptions  Medication Sig Dispense Refill  . aspirin 81 MG tablet Take 81 mg by mouth daily.    [name] doxazosin (CARDURA) 4 MG tablet Take 4 mg by mouth daily.     [name] levothyroxine (SYNTHROID, LEVOTHROID) 50 MCG tablet Take 50 mcg by mouth daily before breakfast.    . losartan (COZAAR) 100 MG tablet Take 100 mg by mouth daily.    . verapamil (VERELAN PM) 240 MG 24 hr capsule Take 240 mg by mouth at bedtime.    [name] doxazosin (CARDURA) 2 MG tablet Take 4 mg by mouth daily.     No current facility-administered medications for this visit.      Review of Systems:       Cardiac Review of Systems: Y or N  Chest Pain [ n  ]  Resting SOB Blue.Reese   ] Exertional SOB  Blue.Reese  ]  Vertell Limber Florencio.Farrier  ]   Pedal Edema [ n  ]    Palpitations [ n ] Syncope  [n  ]   Presyncope [  n ]  General Review of Systems: [Y] = yes [  ]=no Constitional: recent weight change [  ];  Wt loss over the last 3 months [   ] anorexia [  ]; fatigue [  ]; nausea [  ]; night sweats [  ]; fever [  ]; or chills [  ];          Dental: poor dentition[ n ]; Last Dentist visit:   Eye : blurred vision [  ]; diplopia [   ]; vision changes [  ];  Amaurosis fugax[  ]; Resp: cough Blue.Reese  ];  wheezing[ y ];  hemoptysis[ n ]; shortness of breath[ y ]; paroxysmal nocturnal dyspnea[ y ]; dyspnea on exertion[y  ]; or orthopnea[  ];  GI:  gallstones[  ], vomiting[  ];  dysphagia[  ]; melena[  ];  hematochezia [  ]; heartburn[  ];   Hx of  Colonoscopy[  ]; GU: kidney stones [  ]; hematuria[  ];   dysuria [  ];  nocturia[  ];  history of     obstruction [  ]; urinary frequency [  ]             Skin: rash, swelling[  ];, hair loss[  ];  peripheral edema[  ];  or itching[  ]; Musculosketetal: myalgias[  ];  joint swelling[  ];  joint erythema[  ];  joint pain[  ];  back pain[  ];  Heme/Lymph: bruising[  ];  bleeding[  ];  anemia[  ];  Neuro: TIA[  ];  headaches[  ];  stroke[  ];  vertigo[  ];  seizures[  ];   paresthesias[  ];  difficulty walking[  ];  Psych:depression[  ]; anxiety[  ];  Endocrine: diabetes[  ];  thyroid dysfunction[  ];  Immunizations: Flu up to date [  ]; Pneumococcal up to date [  ];  Other:  Physical Exam: Ht '5\' 2"'$  (1.575 m)  Wt 150 lb (68.04 kg)  BMI 27.43 kg/m2  SpO2   PHYSICAL EXAMINATION: General appearance: alert, cooperative and appears stated age Head: Normocephalic, without obvious abnormality, atraumatic Neck: no adenopathy, no carotid bruit, no JVD, supple, symmetrical, trachea midline and thyroid not enlarged, symmetric, no tenderness/mass/nodules Lymph nodes: Cervical, supraclavicular,  and axillary nodes normal. Resp: diminished breath sounds RLL Back: symmetric, no curvature. ROM normal. No CVA tenderness. Cardio: regular rate and rhythm, S1, S2 normal, no murmur, click, rub or gallop GI: soft, non-tender; bowel sounds normal; no masses,  no organomegaly Extremities: extremities normal, atraumatic, no cyanosis or edema and Homans sign is negative, no sign of DVT Neurologic: Grossly normal  Diagnostic Studies & Laboratory data:     Recent Radiology Findings:  CLINICAL DATA: Subsequent encounter for right lower lobe consolidation. Previous bronchoscopy showed "Atypical cells suggestive of malignancy" .  EXAM: CT CHEST WITHOUT CONTRAST  TECHNIQUE: Multidetector CT imaging of the chest was performed following the standard protocol without IV contrast.  COMPARISON: Chest CT from 04/02/2014. Chest x-ray from 04/06/2014.  FINDINGS: Mediastinum / Lymph Nodes: There is no axillary lymphadenopathy. No mediastinal or hilar lymphadenopathy. The heart size is upper normal. Coronary artery calcification is noted. No pericardial effusion.  Lungs / Pleura: The posterior right lower lobe confluent airspace disease appears slightly progressive in the interval. Pulmonary nodules in the right lower lobe have progressed. 3 mm right lower lobe nodule on the prior study is now 5 mm (image 33 series 3).  MSK / Soft Tissues: Bone windows reveal no worrisome lytic or sclerotic osseous lesions.  Upper Abdomen: No adrenal nodule or mass.  IMPRESSION: Interval progression of confluent right lower lobe disease with new and progressing satellite nodules in the right lower lobe. Imaging features are concerning for neoplasm with worsening infection/inflammation considered a less likely possibility.   Electronically Signed  By: Misty Stanley M.D.  On: 08/31/2014 16:20 CLINICAL DATA: 81 year old female with history of persistent cough and right lower lobe  infiltrate since January 2015.  EXAM: CT CHEST WITHOUT CONTRAST  TECHNIQUE: Multidetector CT imaging of the chest was performed following the standard protocol without IV contrast.  COMPARISON: Chest CT 02/06/2014.  FINDINGS: Mediastinum: Heart size is mildly enlarged. There is no significant pericardial fluid, thickening or pericardial calcification. There is atherosclerosis of the thoracic aorta, the great vessels of the mediastinum and the coronary arteries, including calcified atherosclerotic plaque in the left anterior descending and left circumflex coronary arteries. Calcifications of the mitral annulus and mitral sub valvular apparatus. No pathologically enlarged mediastinal or hilar lymph nodes. Please note that accurate exclusion of hilar adenopathy is limited on noncontrast CT scans. Esophagus is unremarkable in appearance.  Lungs/Pleura: Again noted is extensive airspace consolidation, predominantly in the dependent portion of the basal segments of the right lower lobe. This is in a similar distribution to prior examinations, but appears slightly more extensive than study 02/06/2014, and has clearly increased compared to more remote prior CT examination 06/23/2013. On soft tissue windows there are some areas of relative low attenuation (-10 HU) interspersed with areas of higher attenuation (27 HU). This is of uncertain etiology and significance, but could suggest a lipoid component. At the margins of the airspace consolidation there is ground-glass attenuation and septal thickening. Patchy areas of mild subpleural  reticulation are also seen scattered throughout the periphery of the lungs bilaterally. No pleural effusions. Two small nodules are also noted in the right lower lobe, separate from the consolidative changes, measuring 3 and 4 mm on image 36 and 35 of series 3 respectively. These are similar to the most recent prior examination, but are new compared  to the more remote prior CT examination from 06/23/2013. Mild paraseptal emphysema. Mild diffuse bronchial wall thickening.  Upper Abdomen: Unremarkable.  Musculoskeletal: There are no aggressive appearing lytic or blastic lesions noted in the visualized portions of the skeleton.  IMPRESSION: 1. Persistent airspace consolidation in the right lower lobe is unusual in appearance. Differential considerations include both benign processes such as lipoid pneumonia from chronic aspiration, and malignant processes such as an invasive adenocarcinoma. Correlation with bronchoscopy is recommended. 2. There is a pattern of mild subpleural reticulation throughout the periphery of the lungs bilaterally, which is nonspecific, but could indicate early changes of interstitial lung disease. Attention on followup studies is recommended, with consideration for repeat high-resolution chest CT in 1 year to assess for temporal changes in the appearance of the lung parenchyma if clinically appropriate. 3. Mild diffuse bronchial wall thickening with mild paraseptal emphysema. 4. Mild cardiomegaly. 5. Atherosclerosis, including 2 vessel coronary artery disease. Please note that although the presence of coronary artery calcium documents the presence of coronary artery disease, the severity of this disease and any potential stenosis cannot be assessed on this non-gated CT examination. Assessment for potential risk factor modification, dietary therapy or pharmacologic therapy may be warranted, if clinically indicated. These results were called by telephone at the time of interpretation on 04/02/2014 at 5:26 pm to Dr. Simonne Maffucci, who verbally acknowledged these results.   Electronically Signed  By: Vinnie Langton M.D.  On: 04/02/2014 17:30  I have independently reviewed the above radiology studies  and reviewed the findings with the patient.     08/29/2014                                                                                                    03/2014  Recent Lab Findings: Lab Results  Component Value Date   WBC 7.6 09/10/2014   HGB 11.6* 09/10/2014   HCT 35.3* 09/10/2014   PLT 198.0 03/29/2014   GLUCOSE 139* 09/10/2014   ALT 11 09/04/2014   AST 17 09/04/2014   NA 133* 09/10/2014   K 3.9 09/10/2014   CL 99 09/10/2014   CREATININE 0.79 09/10/2014   BUN 13 09/10/2014   CO2 23 09/10/2014   TSH 1.96 09/21/2014   INR 1.1* 03/29/2014      Assessment / Plan:   Progressive Infiltrative/consodative process progressive over 14 months with now increased SOB. Not clear malignancy. Several small pulmonary nodules noted on right increased slightly in size. Patient come to office for opinion about biopsy. With her age she and her family are concerned about any major operative procedure.  The patient's information and radiographic findings was presented at the multi-disarray thoracic oncology conference, it was felt that a PET scan would not help in resolving  the potential diagnoses here. Interventional radiology reviewed the films and was agreeable if the patient agreed to proceed with CT-guided needle biopsy of the right lower lobe infiltrating process. This recommendation has been discussed with the patient and her son including the potential risks of needle biopsy. She will consider her options and call the office back in the next several days if she would like to proceed with needle biopsy. If she proceeds with needle biopsy follow-up appointment will be made several days after the biopsy, if she wishes to delay any diagnostic procedures will make her a follow-up appointment in the office.    I  spent 15 minutes counseling the patient face to face and 50% or more the  time was spent in counseling and coordination of care. The total time spent in the appointment was 20 minutes.  Grace Isaac MD      [address] [place] [address] Office  [phone]   [facility]eeper [phone]  10/11/2014 10:24 AM

## 2014-10-15 ENCOUNTER — Other Ambulatory Visit: Payer: Self-pay

## 2014-10-15 DIAGNOSIS — R911 Solitary pulmonary nodule: Secondary | ICD-10-CM

## 2014-10-19 ENCOUNTER — Other Ambulatory Visit: Payer: Self-pay | Admitting: Radiology

## 2014-10-22 ENCOUNTER — Other Ambulatory Visit: Payer: Self-pay | Admitting: Radiology

## 2014-10-23 ENCOUNTER — Ambulatory Visit (HOSPITAL_COMMUNITY)
Admission: RE | Admit: 2014-10-23 | Discharge: 2014-10-23 | Disposition: A | Discharge status: Home / Self Care | Payer: Medicare Other | Source: Ambulatory Visit | Attending: Cardiothoracic Surgery | Admitting: Cardiothoracic Surgery

## 2014-10-23 ENCOUNTER — Ambulatory Visit (HOSPITAL_COMMUNITY)
Admission: RE | Admit: 2014-10-23 | Discharge: 2014-10-23 | Disposition: A | Discharge status: Home / Self Care | Payer: Medicare Other | Source: Ambulatory Visit | Attending: Interventional Radiology | Admitting: Interventional Radiology

## 2014-10-23 ENCOUNTER — Encounter (HOSPITAL_COMMUNITY): Payer: Self-pay

## 2014-10-23 DIAGNOSIS — Z7982 Long term (current) use of aspirin: Secondary | ICD-10-CM | POA: Diagnosis not present

## 2014-10-23 DIAGNOSIS — J95811 Postprocedural pneumothorax: Secondary | ICD-10-CM | POA: Diagnosis not present

## 2014-10-23 DIAGNOSIS — Z825 Family history of asthma and other chronic lower respiratory diseases: Secondary | ICD-10-CM | POA: Diagnosis not present

## 2014-10-23 DIAGNOSIS — Z8701 Personal history of pneumonia (recurrent): Secondary | ICD-10-CM | POA: Insufficient documentation

## 2014-10-23 DIAGNOSIS — R911 Solitary pulmonary nodule: Secondary | ICD-10-CM

## 2014-10-23 DIAGNOSIS — I10 Essential (primary) hypertension: Secondary | ICD-10-CM | POA: Diagnosis not present

## 2014-10-23 DIAGNOSIS — Z87891 Personal history of nicotine dependence: Secondary | ICD-10-CM | POA: Insufficient documentation

## 2014-10-23 DIAGNOSIS — R918 Other nonspecific abnormal finding of lung field: Secondary | ICD-10-CM | POA: Diagnosis present

## 2014-10-23 DIAGNOSIS — C3431 Malignant neoplasm of lower lobe, right bronchus or lung: Secondary | ICD-10-CM | POA: Diagnosis not present

## 2014-10-23 DIAGNOSIS — E079 Disorder of thyroid, unspecified: Secondary | ICD-10-CM | POA: Insufficient documentation

## 2014-10-23 DIAGNOSIS — Z79899 Other long term (current) drug therapy: Secondary | ICD-10-CM | POA: Insufficient documentation

## 2014-10-23 DIAGNOSIS — Y848 Other medical procedures as the cause of abnormal reaction of the patient, or of later complication, without mention of misadventure at the time of the procedure: Secondary | ICD-10-CM | POA: Insufficient documentation

## 2014-10-23 DIAGNOSIS — I6522 Occlusion and stenosis of left carotid artery: Secondary | ICD-10-CM | POA: Diagnosis not present

## 2014-10-23 LAB — CBC
HEMATOCRIT: 33.3 % — AB (ref 36.0–46.0)
Hemoglobin: 11.2 g/dL — ABNORMAL LOW (ref 12.0–15.0)
MCH: 31.3 pg (ref 26.0–34.0)
MCHC: 33.6 g/dL (ref 30.0–36.0)
MCV: 93 fL (ref 78.0–100.0)
Platelets: 193 10*3/uL (ref 150–400)
RBC: 3.58 MIL/uL — ABNORMAL LOW (ref 3.87–5.11)
RDW: 13.4 % (ref 11.5–15.5)
WBC: 6.2 10*3/uL (ref 4.0–10.5)

## 2014-10-23 LAB — PROTIME-INR
INR: 1.08 (ref 0.00–1.49)
PROTHROMBIN TIME: 14.1 s (ref 11.6–15.2)

## 2014-10-23 LAB — APTT: APTT: 33 s (ref 24–37)

## 2014-10-23 MED ORDER — MIDAZOLAM HCL 2 MG/2ML IJ SOLN
INTRAMUSCULAR | Status: AC
  Filled 2014-10-23: qty 2

## 2014-10-23 MED ORDER — LIDOCAINE HCL (PF) 1 % IJ SOLN
INTRAMUSCULAR | Status: AC
  Filled 2014-10-23: qty 30

## 2014-10-23 MED ORDER — MIDAZOLAM HCL 2 MG/2ML IJ SOLN
INTRAMUSCULAR | Status: AC | PRN
  Administered 2014-10-23 (×2): 0.5 mg via INTRAVENOUS

## 2014-10-23 MED ORDER — FENTANYL CITRATE (PF) 100 MCG/2ML IJ SOLN
INTRAMUSCULAR | Status: AC
  Filled 2014-10-23: qty 2

## 2014-10-23 MED ORDER — SODIUM CHLORIDE 0.9 % IV SOLN: Freq: Once | INTRAVENOUS | Status: DC

## 2014-10-23 MED ORDER — FENTANYL CITRATE (PF) 100 MCG/2ML IJ SOLN
INTRAMUSCULAR | Status: AC | PRN
  Administered 2014-10-23 (×3): 25 ug via INTRAVENOUS

## 2014-10-23 NOTE — Sedation Documentation (Addendum)
Dr. Earleen Newport in to s/w pt and daughter and answer questions.

## 2014-10-23 NOTE — Procedures (Signed)
Interventional Radiology Procedure Note  Procedure: CT guided  core biopsy of right lung nodule, lower lobe Complications: Small pneumothorax, no treatment.  SIR category 1 Recommendations: - Bedrest supine x 3 hrs - CXR in 2 hours for assessment - Follow biopsy results  Signed,  Dulcy Fanny. Earleen Newport, DO

## 2014-10-23 NOTE — Discharge Instructions (Signed)
Needle Biopsy of Lung, Care After °Refer to this sheet in the next few weeks. These instructions provide you with information on caring for yourself after your procedure. Your health care provider may also give you more specific instructions. Your treatment has been planned according to current medical practices, but problems sometimes occur. Call your health care provider if you have any problems or questions after your procedure. °WHAT TO EXPECT AFTER THE PROCEDURE °· A bandage will be applied over the area where the needle was inserted. You may be asked to apply pressure to the bandage for several minutes to ensure there is minimal bleeding. °· In most cases, you can leave when your needle biopsy procedure is completed. Do not drive yourself home. Someone else should take you home. °· If you received an IV sedative or general anesthetic, you will be taken to a comfortable place to relax while the medicine wears off. °· If you have upcoming travel scheduled, talk to your health care provider about when it is safe to travel by air after the procedure. °HOME CARE INSTRUCTIONS °· Expect to take it easy for the rest of the day. °· Protect the area where you received the needle biopsy by keeping the bandage in place for as long as instructed. °· You may feel some mild pain or discomfort in the area, but this should stop in a day or two. °· Take medicines only as directed by your health care provider. °SEEK MEDICAL CARE IF:  °· You have pain at the biopsy site that worsens or is not helped by medicine. °· You have swelling or drainage at the needle biopsy site. °· You have a fever. °SEEK IMMEDIATE MEDICAL CARE IF:  °· You have new or worsening shortness of breath. °· You have chest pain. °· You are coughing up blood. °· You have bleeding that does not stop with pressure or a bandage. °· You develop light-headedness or fainting. °Document Released: 03/29/2007 Document Revised: 10/16/2013 Document Reviewed:  10/24/2012 °ExitCare® Patient Information ©2015 [employer]. This information is not intended to replace advice given to you by your health care provider. Make sure you discuss any questions you have with your health care provider. ° °

## 2014-10-23 NOTE — Sedation Documentation (Signed)
Pt with eyes open, Dr. Earleen Newport obtaining samples. Pt tolerating well, no c/o pain or moaning or grimacing noted.

## 2014-10-23 NOTE — H&P (Addendum)
Chief Complaint: Right lung Infiltrate/Mass  Referring Physician:Gerhardt HPI: [name] Carlson is an 81 y.o. female with Right lower lobe infiltrate/mass found on CT scan which is suspicious for Malignancy. It was felt PET scan was of no benefit. She currently denies any cough or SOB.  Past Medical History:  Past Medical History  Diagnosis Date  . Hypertension   . External carotid artery stenosis     L side dx'd with doppler 2012  . UTI (lower urinary tract infection)   . Pneumonia   . Thyroid disease     Past Surgical History:  Past Surgical History  Procedure Laterality Date  . Video bronchoscopy Bilateral 04/06/2014    Procedure: VIDEO BRONCHOSCOPY WITH FLUORO;  Surgeon: Juanito Doom, MD;  Location: [facility];  Service: Cardiopulmonary;  Laterality: Bilateral;  . Eye surgery      Family History:  Family History  Problem Relation Age of Onset  . Emphysema Father     Social History:  reports that she quit smoking about 55 years ago. Her smoking use included Cigarettes. She has a 12 pack-year smoking history. She has never used smokeless tobacco. She reports that she does not drink alcohol or use illicit drugs.  Allergies:  Allergies  Allergen Reactions  . Corticosteroids   . Minocycline     Pt states became disoriented and had a stiff neck.    Medications:   Medication List    ASK your doctor about these medications        aspirin 81 MG tablet  Take 81 mg by mouth daily.     doxazosin 4 MG tablet  Commonly known as:  CARDURA  Take 4 mg by mouth daily.     levothyroxine 50 MCG tablet  Commonly known as:  SYNTHROID, LEVOTHROID  Take 50 mcg by mouth daily before breakfast.     losartan 100 MG tablet  Commonly known as:  COZAAR  Take 100 mg by mouth daily.     verapamil 240 MG 24 hr capsule  Commonly known as:  VERELAN PM  Take 240 mg by mouth at bedtime.        Please HPI for pertinent positives, otherwise complete 10 system ROS  negative.  Physical Exam: BP 156/43 mmHg  Pulse 51  Temp(Src) 97.6 F (36.4 C) (Oral)  Resp 18  Ht '5\' 3"'$  (1.6 m)  Wt 150 lb (68.04 kg)  BMI 26.58 kg/m2  SpO2 100% Body mass index is 26.58 kg/(m^2).   General Appearance:  Alert, cooperative, no distress, appears stated age  Head:  Normocephalic, without obvious abnormality, atraumatic  ENT: Unremarkable  Neck: Supple, symmetrical, trachea midline  Lungs:   Clear to auscultation bilaterally, no w/r/r, respirations unlabored without use of accessory muscles.  Chest Wall:  No tenderness or deformity  Heart:  Regular rate and rhythm, S1, S2 normal, no murmur, rub or gallop.  Abdomen:   Soft, non-tender, non distended.  Extremities: Extremities normal, atraumatic, no cyanosis or edema  Neurologic: Normal affect, no gross deficits.  Labs: No results found for this or any previous visit (from the past 48 hour(s)).  Imaging: No results found.  Assessment/Plan Right lower lobe lung mass For CT guided biopsy of RLL mass. Risks and Benefits discussed with the patient including, but not limited to bleeding, infection, death, pneumothorax, damage to adjacent structures or low yield requiring additional tests. All of the patient's questions were answered, patient and family member agreeable to proceed. Consent signed and in chart.  Gareth Eagle R PA-C 10/23/2014, 10:26 AM

## 2014-10-23 NOTE — Sedation Documentation (Signed)
Patient is resting comfortably. 

## 2014-10-23 NOTE — Sedation Documentation (Addendum)
Pt following commands to hold breath w/out difficulty, having some pain w/ manipulation of needle at site

## 2014-10-23 NOTE — Sedation Documentation (Signed)
Patient denies pain and is resting comfortably.  

## 2014-10-30 ENCOUNTER — Ambulatory Visit (INDEPENDENT_AMBULATORY_CARE_PROVIDER_SITE_OTHER): Payer: Medicare Other | Admitting: Cardiothoracic Surgery

## 2014-10-30 ENCOUNTER — Encounter: Payer: Self-pay | Admitting: Cardiothoracic Surgery

## 2014-10-30 ENCOUNTER — Encounter (HOSPITAL_COMMUNITY): Payer: Self-pay

## 2014-10-30 VITALS — BP 143/67 | HR 71 | Resp 20 | Ht 63.0 in | Wt 150.0 lb

## 2014-10-30 DIAGNOSIS — R918 Other nonspecific abnormal finding of lung field: Secondary | ICD-10-CM | POA: Diagnosis not present

## 2014-10-30 NOTE — Progress Notes (Signed)
[address]       [place] [address]             [phone]                    Brandin R [facility] Medical Record #[id number] Date of Birth: [birth year]  Referring: Juanito Doom, MD Primary Care: Criselda Peaches, MD  Chief Complaint:    Chief Complaint  Patient presents with  . Lung Mass    f/u post CT biopsy done 10/23/14    History of Present Illness:    [name] Carlson 81 y.o. female is seen in the office  for infiltrative process first noted on CT of chest 06/2013 , progressive starting in right lower lobe and now beginning in left lung. Small pulmonary nodules in right lung have increased 4 mm to 6 mm. Bronchoscopy was done in Oct 2015 : ATYPICAL CELLS PRESENT SEE COMMENT RARE ATYPICAL CELLS PRESENT THAT ARE NOT DIAGNOSTIC OF MALIGNANCY.  Last week CT-guided needle biopsy of the right lower lobe infiltrative process confirmed adenocarcinoma,  Lung, biopsy, right lower lobe - ADENOCARCINOMA. - SEE COMMENT. Microscopic Comment The adenocarcinoma appears well differentiated. Dr. Willeen Niece has reviewed the case and concurs with this interpretation. Dr. Servando Snare was paged on 10/24/14. Per clinician request, a block will be sent to [facility] for molecular studies and the results reported separately. (JBK:gt, 10/24/14) JOSHUA KISH MD  [facility] did not have sufficient tissue to do further testing. Dr. Lyndon Code will review the block and see if there is sufficient tissue to just do ALK and EGFR testing   Since seen last week the patient had pulmonary function studies revealing an FEV1 of 98% of predicted with positive response with bronchodilators and a diffusing capacity of 38% of predicted. At rest she has O2 sats ration 97%.  In discussion with the patient she did not wish to consider surgery as a treatment option.  Current Activity/ Functional Status:  Patient is independent with mobility/ambulation, transfers, ADL's, IADL's. lives  alone, but over last 3-4 weeks increased sob has limited her   Zubrod Score: At the time of surgery this patient's most appropriate activity status/level should be described as: '[]'     0    Normal activity, no symptoms '[]'     1    Restricted in physical strenuous activity but ambulatory, able to do out light work '[x]'     2    Ambulatory and capable of self care, unable to do work activities, up and about               >50 % of waking hours                              '[]'     3    Only limited self care, in bed greater than 50% of waking hours '[]'     4    Completely disabled, no self care, confined to bed or chair '[]'     5    Moribund   Past Medical History  Diagnosis Date  . Hypertension   . External carotid artery stenosis     L side dx'd with doppler 2012  . UTI (lower urinary tract infection)   . Pneumonia   . Thyroid disease     Past Surgical History  Procedure Laterality Date  . Video bronchoscopy Bilateral 04/06/2014  Procedure: VIDEO BRONCHOSCOPY WITH FLUORO;  Surgeon: Juanito Doom, MD;  Location: [facility] ENDOSCOPY;  Service: Cardiopulmonary;  Laterality: Bilateral;  . Eye surgery      Family History  Problem Relation Age of Onset  . Emphysema Father     History   Social History  . Marital Status: Widowed    Spouse Name: N/A  . Number of Children: N/A  . Years of Education: N/A   Occupational History  . Not on file.   Social History Main Topics  . Smoking status: Former Smoker -- 1.00 packs/day for 12 years    Types: Cigarettes    Quit date: 06/16/1959  . Smokeless tobacco: Never Used  . Alcohol Use: No  . Drug Use: No  . Sexual Activity: Not on file     History  Smoking status  . Former Smoker -- 1.00 packs/day for 12 years  . Types: Cigarettes  . Quit date: 06/16/1959  Smokeless tobacco  . Never Used    History  Alcohol Use No     Allergies  Allergen Reactions  . Corticosteroids   . Minocycline     Pt states became disoriented and had a  stiff neck.    Current Outpatient Prescriptions  Medication Sig Dispense Refill  . aspirin 81 MG tablet Take 81 mg by mouth daily.    [name] doxazosin (CARDURA) 4 MG tablet Take 4 mg by mouth daily.     [name] levothyroxine (SYNTHROID, LEVOTHROID) 50 MCG tablet Take 50 mcg by mouth daily before breakfast.    . losartan (COZAAR) 100 MG tablet Take 100 mg by mouth daily.    . verapamil (VERELAN PM) 240 MG 24 hr capsule Take 240 mg by mouth at bedtime.     No current facility-administered medications for this visit.      Review of Systems:     Cardiac Review of Systems: Y or N  Chest Pain [ n  ]  Resting SOB Blue.Reese   ] Exertional SOB  Blue.Reese  ]  Vertell Limber Florencio.Farrier  ]   Pedal Edema [ n  ]    Palpitations [ n ] Syncope  [n  ]   Presyncope [  n ]  General Review of Systems: [Y] = yes [  ]=no Constitional: recent weight change [  ];  Wt loss over the last 3 months [   ] anorexia [  ]; fatigue [  ]; nausea [  ]; night sweats [  ]; fever [  ]; or chills [  ];          Dental: poor dentition[ n ]; Last Dentist visit:   Eye : blurred vision [  ]; diplopia [   ]; vision changes [  ];  Amaurosis fugax[  ]; Resp: cough Blue.Reese  ];  wheezing[ y ];  hemoptysis[ n ]; shortness of breath[ y ]; paroxysmal nocturnal dyspnea[ y ]; dyspnea on exertion[y  ]; or orthopnea[  ];  GI:  gallstones[  ], vomiting[  ];  dysphagia[  ]; melena[  ];  hematochezia [  ]; heartburn[  ];   Hx of  Colonoscopy[  ]; GU: kidney stones [  ]; hematuria[  ];   dysuria [  ];  nocturia[  ];  history of     obstruction [  ]; urinary frequency [  ]             Skin: rash, swelling[  ];, hair loss[  ];  peripheral edema[  ];  or itching[  ]; Musculosketetal: myalgias[  ];  joint swelling[  ];  joint erythema[  ];  joint pain[  ];  back pain[  ];  Heme/Lymph: bruising[  ];  bleeding[  ];  anemia[  ];  Neuro: TIA[  ];  headaches[  ];  stroke[  ];  vertigo[  ];  seizures[  ];   paresthesias[  ];  difficulty walking[  ];  Psych:depression[  ]; anxiety[   ];  Endocrine: diabetes[  ];  thyroid dysfunction[  ];  Immunizations: Flu up to date [  ]; Pneumococcal up to date [  ];  Other:  Physical Exam: BP 143/67 mmHg  Pulse 71  Resp 20  Ht '5\' 3"'  (1.6 m)  Wt 150 lb (68.04 kg)  BMI 26.58 kg/m2  SpO2 96%  PHYSICAL EXAMINATION: General appearance: alert, cooperative and appears stated age Head: Normocephalic, without obvious abnormality, atraumatic Neck: no adenopathy, no carotid bruit, no JVD, supple, symmetrical, trachea midline and thyroid not enlarged, symmetric, no tenderness/mass/nodules Lymph nodes: Cervical, supraclavicular, and axillary nodes normal. Resp: diminished breath sounds RLL Back: symmetric, no curvature. ROM normal. No CVA tenderness. Cardio: regular rate and rhythm, S1, S2 normal, no murmur, click, rub or gallop GI: soft, non-tender; bowel sounds normal; no masses,  no organomegaly Extremities: extremities normal, atraumatic, no cyanosis or edema and Homans sign is negative, no sign of DVT Neurologic: Grossly normal  Diagnostic Studies & Laboratory data:     Recent Radiology Findings:  CLINICAL DATA: Subsequent encounter for right lower lobe consolidation. Previous bronchoscopy showed "Atypical cells suggestive of malignancy" .  EXAM: CT CHEST WITHOUT CONTRAST  TECHNIQUE: Multidetector CT imaging of the chest was performed following the standard protocol without IV contrast.  COMPARISON: Chest CT from 04/02/2014. Chest x-ray from 04/06/2014.  FINDINGS: Mediastinum / Lymph Nodes: There is no axillary lymphadenopathy. No mediastinal or hilar lymphadenopathy. The heart size is upper normal. Coronary artery calcification is noted. No pericardial effusion.  Lungs / Pleura: The posterior right lower lobe confluent airspace disease appears slightly progressive in the interval. Pulmonary nodules in the right lower lobe have progressed. 3 mm right lower lobe nodule on the prior study is now 5 mm (image 33  series 3).  MSK / Soft Tissues: Bone windows reveal no worrisome lytic or sclerotic osseous lesions.  Upper Abdomen: No adrenal nodule or mass.  IMPRESSION: Interval progression of confluent right lower lobe disease with new and progressing satellite nodules in the right lower lobe. Imaging features are concerning for neoplasm with worsening infection/inflammation considered a less likely possibility.   Electronically Signed  By: Misty Stanley M.D.  On: 08/31/2014 16:20 CLINICAL DATA: 81 year old female with history of persistent cough and right lower lobe infiltrate since January 2015.  EXAM: CT CHEST WITHOUT CONTRAST  TECHNIQUE: Multidetector CT imaging of the chest was performed following the standard protocol without IV contrast.  COMPARISON: Chest CT 02/06/2014.  FINDINGS: Mediastinum: Heart size is mildly enlarged. There is no significant pericardial fluid, thickening or pericardial calcification. There is atherosclerosis of the thoracic aorta, the great vessels of the mediastinum and the coronary arteries, including calcified atherosclerotic plaque in the left anterior descending and left circumflex coronary arteries. Calcifications of the mitral annulus and mitral sub valvular apparatus. No pathologically enlarged mediastinal or hilar lymph nodes. Please note that accurate exclusion of hilar adenopathy is limited on noncontrast CT scans. Esophagus is unremarkable in appearance.  Lungs/Pleura: Again noted is extensive airspace consolidation, predominantly in the dependent portion of the  basal segments of the right lower lobe. This is in a similar distribution to prior examinations, but appears slightly more extensive than study 02/06/2014, and has clearly increased compared to more remote prior CT examination 06/23/2013. On soft tissue windows there are some areas of relative low attenuation (-10 HU) interspersed with areas of higher attenuation  (27 HU). This is of uncertain etiology and significance, but could suggest a lipoid component. At the margins of the airspace consolidation there is ground-glass attenuation and septal thickening. Patchy areas of mild subpleural reticulation are also seen scattered throughout the periphery of the lungs bilaterally. No pleural effusions. Two small nodules are also noted in the right lower lobe, separate from the consolidative changes, measuring 3 and 4 mm on image 36 and 35 of series 3 respectively. These are similar to the most recent prior examination, but are new compared to the more remote prior CT examination from 06/23/2013. Mild paraseptal emphysema. Mild diffuse bronchial wall thickening.  Upper Abdomen: Unremarkable.  Musculoskeletal: There are no aggressive appearing lytic or blastic lesions noted in the visualized portions of the skeleton.  IMPRESSION: 1. Persistent airspace consolidation in the right lower lobe is unusual in appearance. Differential considerations include both benign processes such as lipoid pneumonia from chronic aspiration, and malignant processes such as an invasive adenocarcinoma. Correlation with bronchoscopy is recommended. 2. There is a pattern of mild subpleural reticulation throughout the periphery of the lungs bilaterally, which is nonspecific, but could indicate early changes of interstitial lung disease. Attention on followup studies is recommended, with consideration for repeat high-resolution chest CT in 1 year to assess for temporal changes in the appearance of the lung parenchyma if clinically appropriate. 3. Mild diffuse bronchial wall thickening with mild paraseptal emphysema. 4. Mild cardiomegaly. 5. Atherosclerosis, including 2 vessel coronary artery disease. Please note that although the presence of coronary artery calcium documents the presence of coronary artery disease, the severity of this disease and any potential stenosis  cannot be assessed on this non-gated CT examination. Assessment for potential risk factor modification, dietary therapy or pharmacologic therapy may be warranted, if clinically indicated. These results were called by telephone at the time of interpretation on 04/02/2014 at 5:26 pm to Dr. Simonne Maffucci, who verbally acknowledged these results.   Electronically Signed  By: Vinnie Langton M.D.  On: 04/02/2014 17:30  I have independently reviewed the above radiology studies  and reviewed the findings with the patient.     08/29/2014                                                                                                   03/2014  Recent Lab Findings: Lab Results  Component Value Date   WBC 6.2 10/23/2014   HGB 11.2* 10/23/2014   HCT 33.3* 10/23/2014   PLT 193 10/23/2014   GLUCOSE 139* 09/10/2014   ALT 11 09/04/2014   AST 17 09/04/2014   NA 133* 09/10/2014   K 3.9 09/10/2014   CL 99 09/10/2014   CREATININE 0.79 09/10/2014   BUN 13 09/10/2014   CO2 23 09/10/2014   TSH 1.96  09/21/2014   INR 1.08 10/23/2014   PATH: Diagnosis Lung, biopsy, right lower lobe - ADENOCARCINOMA. - SEE COMMENT. Microscopic Comment The adenocarcinoma appears well differentiated. Dr. Willeen Niece has reviewed the case and concurs with this interpretation. Dr. Servando Snare was paged on 10/24/14. Per clinician request, a block will be sent to [facility] for molecular studies and the results reported separately. (JBK:gt, 10/24/14) [name] Cutter MD Pathologist, Electronic Signature   Assessment / Plan:   Progressive Infiltrative/consodative process progressive over 14 months with now increased SOB.  Several small pulmonary nodules noted on right increased slightly in size. Patient come to office for opinion about biopsy. With her age she and her family are concerned about any major operative procedure.  The patient's information and radiographic findings was presented at the  multi-disarray thoracic oncology conference, needle biopsy has confirmed adenocarcinoma but so far further genetic testing is met with limitation of tissue availability. I've reviewed with the patient and her family potential treatment options including targeted genetic therapy and/or radiation. The patient notes with her age she was not interested in surgical treatment. With her desire to receive with consideration treatment we will obtain a PET scan, will review with pathology the availability of limited testing on the current tissue. I've discussed with the patient the possible need for repeat biopsy. Follow-up appointment in the [facility] clinic will be arranged.        [address] [place] [address] Office [phone]   Beeper [phone]  10/30/2014 2:29 PM

## 2014-10-31 ENCOUNTER — Other Ambulatory Visit: Payer: Self-pay | Admitting: *Deleted

## 2014-10-31 DIAGNOSIS — C3491 Malignant neoplasm of unspecified part of right bronchus or lung: Secondary | ICD-10-CM

## 2014-11-01 ENCOUNTER — Telehealth: Payer: Self-pay | Admitting: *Deleted

## 2014-11-01 NOTE — Telephone Encounter (Signed)
Oncology Nurse Navigator Documentation  Oncology Nurse Navigator Flowsheets 11/01/2014  Referral date to RadOnc/MedOnc [phone]  Navigator Encounter Type Introductory phone call  Patient Visit Type Initial  Treatment Phase Other  Barriers/Navigation Needs No barriers at this time  Interventions Coordination of Care.  Set patient up for blood test this Friday and thoracic clinic on 11/15/14.  I spoke with son who was thankful for the coordination of care.    Coordination of Care MD Appointments  Time Spent with Patient 30

## 2014-11-02 ENCOUNTER — Other Ambulatory Visit: Payer: Medicare Other

## 2014-11-02 ENCOUNTER — Other Ambulatory Visit: Payer: Self-pay | Admitting: *Deleted

## 2014-11-02 DIAGNOSIS — R9389 Abnormal findings on diagnostic imaging of other specified body structures: Secondary | ICD-10-CM

## 2014-11-05 ENCOUNTER — Encounter: Payer: Self-pay | Admitting: *Deleted

## 2014-11-08 ENCOUNTER — Telehealth: Payer: Self-pay | Admitting: *Deleted

## 2014-11-08 NOTE — Telephone Encounter (Signed)
Carrissa called to "confirm Dr. Julien Nordmann is the doctor for this patient and request an ICD-10 code for Guardent 360 liquid biopsy test.  Test is under Dr. Servando Snare who is a surgeon."  Dr. Julien Nordmann notified of call.  Has not seen this patient as of this time. No ICD-10 code noted per EPIC.  Verbal order received and read back for patient to contact Oncologic surgeon.  Carrissa asked for the number to call.  [phone] provided.

## 2014-11-09 ENCOUNTER — Telehealth: Payer: Self-pay | Admitting: *Deleted

## 2014-11-09 ENCOUNTER — Other Ambulatory Visit: Payer: Self-pay | Admitting: *Deleted

## 2014-11-09 DIAGNOSIS — R9389 Abnormal findings on diagnostic imaging of other specified body structures: Secondary | ICD-10-CM

## 2014-11-09 NOTE — Telephone Encounter (Signed)
Oncology Nurse Navigator Documentation  Oncology Nurse Navigator Flowsheets 11/09/2014  Referral date to RadOnc/MedOnc   Navigator Encounter Type Clinic/MDC  Patient Visit Type   Treatment Phase   Barriers/Navigation Needs No barriers at this time  Interventions Coordination of Care.  Called son to change appt time. He verbalized understanding.    Coordination of Care MD Appointments  Time Spent with Patient 5

## 2014-11-13 ENCOUNTER — Ambulatory Visit (HOSPITAL_COMMUNITY)
Admission: RE | Admit: 2014-11-13 | Discharge: 2014-11-13 | Disposition: A | Discharge status: Home / Self Care | Payer: Medicare Other | Source: Ambulatory Visit | Attending: Cardiothoracic Surgery | Admitting: Cardiothoracic Surgery

## 2014-11-13 DIAGNOSIS — C3491 Malignant neoplasm of unspecified part of right bronchus or lung: Secondary | ICD-10-CM

## 2014-11-13 DIAGNOSIS — E279 Disorder of adrenal gland, unspecified: Secondary | ICD-10-CM | POA: Insufficient documentation

## 2014-11-13 DIAGNOSIS — I700 Atherosclerosis of aorta: Secondary | ICD-10-CM | POA: Insufficient documentation

## 2014-11-13 DIAGNOSIS — R918 Other nonspecific abnormal finding of lung field: Secondary | ICD-10-CM | POA: Diagnosis not present

## 2014-11-13 LAB — GLUCOSE, CAPILLARY: Glucose-Capillary: 92 mg/dL (ref 65–99)

## 2014-11-13 MED ORDER — FLUDEOXYGLUCOSE F - 18 (FDG) INJECTION
7.4300 | Freq: Once | INTRAVENOUS | Status: AC | PRN
  Administered 2014-11-13: 7.43 via INTRAVENOUS

## 2014-11-14 ENCOUNTER — Telehealth: Payer: Self-pay | Admitting: *Deleted

## 2014-11-14 NOTE — Telephone Encounter (Signed)
Called pt w/ a friendly reminder about clinic tomorrow 6/2 & confirmed appt time and gave directions and instructions.

## 2014-11-15 ENCOUNTER — Encounter: Payer: Self-pay | Admitting: Internal Medicine

## 2014-11-15 ENCOUNTER — Encounter: Payer: Self-pay | Admitting: *Deleted

## 2014-11-15 ENCOUNTER — Ambulatory Visit: Payer: Medicare Other | Admitting: Radiation Oncology

## 2014-11-15 ENCOUNTER — Other Ambulatory Visit (HOSPITAL_BASED_OUTPATIENT_CLINIC_OR_DEPARTMENT_OTHER): Payer: Medicare Other

## 2014-11-15 ENCOUNTER — Ambulatory Visit: Payer: Medicare Other | Attending: Internal Medicine | Admitting: Physical Therapy

## 2014-11-15 ENCOUNTER — Ambulatory Visit (HOSPITAL_BASED_OUTPATIENT_CLINIC_OR_DEPARTMENT_OTHER): Payer: Medicare Other | Admitting: Internal Medicine

## 2014-11-15 ENCOUNTER — Telehealth: Payer: Self-pay | Admitting: Internal Medicine

## 2014-11-15 VITALS — BP 156/45 | HR 46 | Temp 97.6°F | Resp 18 | Ht 63.0 in | Wt 152.7 lb

## 2014-11-15 DIAGNOSIS — C3431 Malignant neoplasm of lower lobe, right bronchus or lung: Secondary | ICD-10-CM

## 2014-11-15 DIAGNOSIS — R0602 Shortness of breath: Secondary | ICD-10-CM

## 2014-11-15 DIAGNOSIS — R9389 Abnormal findings on diagnostic imaging of other specified body structures: Secondary | ICD-10-CM

## 2014-11-15 DIAGNOSIS — R2689 Other abnormalities of gait and mobility: Secondary | ICD-10-CM

## 2014-11-15 DIAGNOSIS — R29818 Other symptoms and signs involving the nervous system: Secondary | ICD-10-CM | POA: Insufficient documentation

## 2014-11-15 DIAGNOSIS — C3491 Malignant neoplasm of unspecified part of right bronchus or lung: Secondary | ICD-10-CM

## 2014-11-15 LAB — CBC WITH DIFFERENTIAL/PLATELET
BASO%: 0.6 % (ref 0.0–2.0)
Basophils Absolute: 0 10*3/uL (ref 0.0–0.1)
EOS ABS: 0.3 10*3/uL (ref 0.0–0.5)
EOS%: 5.3 % (ref 0.0–7.0)
HEMATOCRIT: 30.8 % — AB (ref 34.8–46.6)
HEMOGLOBIN: 10.2 g/dL — AB (ref 11.6–15.9)
LYMPH%: 16.2 % (ref 14.0–49.7)
MCH: 31.2 pg (ref 25.1–34.0)
MCHC: 33.2 g/dL (ref 31.5–36.0)
MCV: 94.2 fL (ref 79.5–101.0)
MONO#: 0.9 10*3/uL (ref 0.1–0.9)
MONO%: 16.8 % — ABNORMAL HIGH (ref 0.0–14.0)
NEUT%: 61.1 % (ref 38.4–76.8)
NEUTROS ABS: 3.4 10*3/uL (ref 1.5–6.5)
Platelets: 186 10*3/uL (ref 145–400)
RBC: 3.27 10*6/uL — ABNORMAL LOW (ref 3.70–5.45)
RDW: 14.3 % (ref 11.2–14.5)
WBC: 5.6 10*3/uL (ref 3.9–10.3)
lymph#: 0.9 10*3/uL (ref 0.9–3.3)

## 2014-11-15 LAB — COMPREHENSIVE METABOLIC PANEL (CC13)
ALT: 7 U/L (ref 0–55)
ANION GAP: 7 meq/L (ref 3–11)
AST: 16 U/L (ref 5–34)
Albumin: 3.4 g/dL — ABNORMAL LOW (ref 3.5–5.0)
Alkaline Phosphatase: 78 U/L (ref 40–150)
BILIRUBIN TOTAL: 0.52 mg/dL (ref 0.20–1.20)
BUN: 22.1 mg/dL (ref 7.0–26.0)
CALCIUM: 8.9 mg/dL (ref 8.4–10.4)
CHLORIDE: 104 meq/L (ref 98–109)
CO2: 23 mEq/L (ref 22–29)
Creatinine: 0.9 mg/dL (ref 0.6–1.1)
EGFR: 57 mL/min/{1.73_m2} — ABNORMAL LOW (ref >90)
Glucose: 87 mg/dl (ref 70–140)
POTASSIUM: 4.5 meq/L (ref 3.5–5.1)
SODIUM: 134 meq/L — AB (ref 136–145)
Total Protein: 6.4 g/dL (ref 6.4–8.3)

## 2014-11-15 NOTE — Telephone Encounter (Signed)
per pof to sch pt appt-gave tp copy of sch

## 2014-11-15 NOTE — Progress Notes (Signed)
[facility] [phone]   [phone] Multidisciplinary thoracic oncology clinic  CONSULT NOTE  REFERRING PHYSICIAN: Dr. Martin Majestic  REASON FOR CONSULTATION:  81 years old white female recently diagnosed with lung cancer.   HPI [name] Carlson is a 81 y.o. female with past medical history significant for hypertension, carotid artery stenosis, hypothyroidism, pneumonia as well as remote history of smoking but quit in 1961. The patient mentioned that in December 2014 and she has shortness of breath and cough. She was treated for pneumonia at that time. CT scan of the chest on 06/23/2013 showed right lower lobe airspace consolidation and peribronchial vascular nodularity indicative of pneumonia. There was also a separate 5 mm nodular density in the right midlung zone. The patient was followed by observation and repeat CT scan of the chest on 02/06/2014 showed right lower lobe consolidation concerning for pneumonia but underlying malignancy process could not be excluded. The patient was referred to Dr. Lake Bells and repeat CT scan of the chest was performed on 04/02/2014. It showed persistent air space consolidation in the right lower lobe with unusual appearance and the differential consideration included breast benign process such as Lipiod Pneumonia from aspiration versus malignant process such as invasive adenocarcinoma and correlation with bronchoscopy was recommended. On 04/06/2014 the patient underwent bronchoscopy under the care of Dr. Lake Bells and the final cytology from the bronchial washing of the right lower lobe showed atypical cells suspicious for malignancy. Repeat CT scan of the chest on 08/31/2014 showed interval progression of the confluent right lower lobe disease with new and progressive satellite nodules in the right lower lobe with features concerning for neoplasm.  The patient was referred to Dr. Servando Snare and CT guided biopsy was performed by  interventional radiology on 10/23/2014. The final pathology (Accession: [id number]) was consistent with adenocarcinoma. The tissue block was sent to [facility] one for molecular study but there was insufficient material for the test. We requested the blood sample to be sent to [place] [facility] 4 molecular studies and the final report is still pending and expected to be available next week. The patient also had a PET scan performed on 11/13/2014 and it showed Right lower lobe confluent lung mass is intensely hypermetabolic compatible with primary bronchogenic carcinoma. Assuming non-small cell lung histology this would be consistent with at least T3N1Mx or stage IIIA. Numerous small nodules are identified within the right lower lobe consistent with satellite nodules. 3. Hypermetabolic right hilar lymph node worrisome for tip see lateral hilar lymph node metastasis. 4. Nonspecific FDG uptake is associated with the mildly enlarged left adrenal gland. This is equivocal for adrenal gland metastasis. Dr. Servando Snare kindly referred the patient to me today for evaluation and recommendation regarding treatment of her condition. When seen today the patient is feeling fine except for fatigue and dry cough. She denied having any significant hemoptysis. She has shortness breath with exertion. She denied having any significant chest pain. The patient denied having any significant weight loss or night sweats. She has no headache or visual changes. She denied having any significant nausea or vomiting. Family history significant for a father who died from COPD and mother had brain tumor and died at age 44. The patient is a widow and has 4 children. She was accompanied by her daughter Dola Argyle and her daughter-in-law Manus Gunning. 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 The patient has remote history of smoking 1 pack per day  for around 12 years but quit in 1961. She has no  history of alcohol or drug abuse.  HPI  Past Medical History  Diagnosis Date  . Hypertension   . External carotid artery stenosis     L side dx'd with doppler 2012  . UTI (lower urinary tract infection)   . Pneumonia   . Thyroid disease     Past Surgical History  Procedure Laterality Date  . Video bronchoscopy Bilateral 04/06/2014    Procedure: VIDEO BRONCHOSCOPY WITH FLUORO;  Surgeon: Juanito Doom, MD;  Location: [facility];  Service: Cardiopulmonary;  Laterality: Bilateral;  . Eye surgery      Family History  Problem Relation Age of Onset  . Emphysema Father     Social History History  Substance Use Topics  . Smoking status: Former Smoker -- 1.00 packs/day for 12 years    Types: Cigarettes    Quit date: 06/16/1959  . Smokeless tobacco: Never Used  . Alcohol Use: No    Allergies  Allergen Reactions  . Corticosteroids   . Minocycline     Pt states became disoriented and had a stiff neck.    Current Outpatient Prescriptions  Medication Sig Dispense Refill  . aspirin 81 MG tablet Take 81 mg by mouth daily.    [name] doxazosin (CARDURA) 4 MG tablet Take 4 mg by mouth daily.     [name] levothyroxine (SYNTHROID, LEVOTHROID) 50 MCG tablet Take 50 mcg by mouth daily before breakfast.    . losartan (COZAAR) 100 MG tablet Take 100 mg by mouth daily.    [name] OVER THE COUNTER MEDICATION     . verapamil (VERELAN PM) 240 MG 24 hr capsule Take 240 mg by mouth at bedtime.     No current facility-administered medications for this visit.    Review of Systems  Constitutional: positive for fatigue Eyes: negative Ears, nose, mouth, throat, and face: negative Respiratory: positive for cough and dyspnea on exertion Cardiovascular: negative Gastrointestinal: negative Genitourinary:negative Integument/breast: negative Hematologic/lymphatic: negative Musculoskeletal:negative Neurological: negative Behavioral/Psych: negative Endocrine: negative Allergic/Immunologic:  negative  Physical Exam  OMV:EHMCN, healthy, no distress, well nourished and well developed SKIN: skin color, texture, turgor are normal, no rashes or significant lesions HEAD: Normocephalic, No masses, lesions, tenderness or abnormalities EYES: normal, PERRLA EARS: External ears normal, Canals clear OROPHARYNX:no exudate, no erythema and lips, buccal mucosa, and tongue normal  NECK: supple, no adenopathy, no JVD LYMPH:  no palpable lymphadenopathy, no hepatosplenomegaly BREAST:not examined LUNGS: Few crackles in the right lower lobe of the lung. HEART: regular rate & rhythm, no murmurs and no gallops ABDOMEN:abdomen soft, non-tender, normal bowel sounds and no masses or organomegaly BACK: Back symmetric, no curvature., No CVA tenderness EXTREMITIES:no joint deformities, effusion, or inflammation, no edema, no skin discoloration  NEURO: alert & oriented x 3 with fluent speech, no focal motor/sensory deficits  PERFORMANCE STATUS: ECOG 1  LABORATORY DATA: Lab Results  Component Value Date   WBC 5.6 11/15/2014   HGB 10.2* 11/15/2014   HCT 30.8* 11/15/2014   MCV 94.2 11/15/2014   PLT 186 11/15/2014      Chemistry      Component Value Date/Time   NA 134* 11/15/2014 1349   NA 133* 09/10/2014 1439   K 4.5 11/15/2014 1349   K 3.9 09/10/2014 1439   CL 99 09/10/2014 1439   CO2 23 11/15/2014 1349   CO2 23 09/10/2014 1439   BUN 22.1 11/15/2014 1349   BUN 13 09/10/2014 1439   CREATININE 0.9  11/15/2014 1349   CREATININE 0.79 09/10/2014 1439      Component Value Date/Time   CALCIUM 8.9 11/15/2014 1349   CALCIUM 9.2 09/10/2014 1439   ALKPHOS 78 11/15/2014 1349   ALKPHOS 91 09/04/2014 1924   AST 16 11/15/2014 1349   AST 17 09/04/2014 1924   ALT 7 11/15/2014 1349   ALT 11 09/04/2014 1924   BILITOT 0.52 11/15/2014 1349   BILITOT 0.9 09/04/2014 1924       RADIOGRAPHIC STUDIES: Dg Chest 1 View  10/23/2014   CLINICAL DATA:  Pneumothorax, post biopsy, right.  EXAM: CHEST  1  VIEW  COMPARISON:  April 06, 2014.  FINDINGS: Stable cardiomediastinal silhouette is noted. Minimal right apical pneumothorax is noted. Right basilar opacity is noted.  IMPRESSION: Minimal right apical pneumothorax is noted.   Electronically Signed   By: Marijo Conception, M.D.   On: 10/23/2014 16:31   Nm Pet Image Initial (pi) Skull Base To Thigh  11/13/2014   CLINICAL DATA:  Initial treatment strategy for Lung cancer.  EXAM: NUCLEAR MEDICINE PET SKULL BASE TO THIGH  TECHNIQUE: 7.4 mCi F-18 FDG was injected intravenously. Full-ring PET imaging was performed from the skull base to thigh after the radiotracer. CT data was obtained and used for attenuation correction and anatomic localization.  FASTING BLOOD GLUCOSE:  Value: 92 mg/dl  COMPARISON:  CT 08/31/2014.  FINDINGS: NECK  No hypermetabolic lymph nodes in the neck.  CHEST  No hypermetabolic mediastinal or hilar nodes. Hypermetabolic tumor within the posterior right lung base measures 8.6 x 4.6 by 6.9 cm. The SUV max associated with this mass is equal to 8.47. Hypermetabolic right hilar lymph node has an SUV max equal to 4.0. Numerous small nodules are identified in the right lower lobe. In the right lung base there is a small subpleural nodule measuring 4 mm, image 41/series 4. This is too small to characterize by PET-CT. Also in the right lower lobe is a 5 mm nodule, image 40/series 6.  ABDOMEN/PELVIS  No abnormal hypermetabolic activity within the liver, pancreas, or spleen. Mild asymmetric uptake in the left adrenal gland has an SUV max equal to 3.0. There is mild nodular enlargement measuring 1.2 x 0.8 cm, image 97/series 4. Normal appearance of the right adrenal gland. No hypermetabolic lymph nodes in the abdomen or pelvis. Aortic atherosclerosis noted.  SKELETON  No focal hypermetabolic activity to suggest skeletal metastasis.  IMPRESSION: 1. Right lower lobe confluent lung mass is intensely hypermetabolic compatible with primary bronchogenic carcinoma.  Assuming non-small cell lung histology this would be consistent with at least T3N1Mx or stage IIIa. 2. Numerous small nodules are identified within the right lower lobe consistent with satellite nodules. 3. Hypermetabolic right hilar lymph node worrisome for tip see lateral hilar lymph node metastasis. 4. Nonspecific FDG uptake is associated with the mildly enlarged left adrenal gland. This is equivocal for adrenal gland metastasis. Further assessment with contrast enhanced adrenal gland protocol MRI may be helpful.   Electronically Signed   By: Kerby Moors M.D.   On: 11/13/2014 12:11   Ct Biopsy  10/23/2014   CLINICAL DATA:  81 year old female with a history of multiple right lower lobe lung nodules, consolidation, with a pattern concerning for primary lung carcinoma.  The patient has undergone prior navigational bronchoscopy with a negative result. She has been referred for image guided biopsy.  EXAM: CT GUIDED CORE BIOPSY OF RIGHT LOWER LOBE NODULE  ANESTHESIA/SEDATION: 1.0  Mg IV Versed; 75 mcg IV Fentanyl  Total Moderate Sedation Time: 30 minutes.  PROCEDURE: The procedure risks, benefits, and alternatives were explained to the patient. Questions regarding the procedure were encouraged and answered. Specific to lung biopsy such as this procedure, the chance of pneumothorax was discussed. An occurrence rate of approximately 30% was discussed with the patient, with an occurrence rate of chest tube treatment in approximately 10% of patients. In addition, the chance of cardiopulmonary collapse and death was also discussed as a rare occurrence. The patient understands and consents to the procedure.  The right posterior thorax was prepped with Betadinein a sterile fashion, and a sterile drape was applied covering the operative field. A sterile gown and sterile gloves were used for the procedure. Local anesthesia was provided with 1% Lidocaine.  Once the patient is prepped and draped in the usual sterile  fashion, the skin and subcutaneous tissues were generously infiltrated 1% lidocaine for local anesthesia. A small stab incision was made with 11 blade scalpel.  Using CT guidance, a guide needle was advanced into a peripheral nodule at the right lower lobe. The tip of the needle was confirmed within the nodule before the biopsies were retrieved.  Three separate 18 gauge core biopsy were then retrieved. Sample were placed into formalin solution.  A BioSentry device was deployed to decrease the chance of pneumothorax formation.  Patient tolerated the procedure well and remained hemodynamically stable throughout.  No complications requiring therapy were encountered. A small pneumothorax during the procedure was visualized, with normal blood pressure, heart rate, and oxygenation. This did not require a chest tube.  COMPLICATIONS: Pneumothorax not requiring treatment.  SIR category 1  FINDINGS: [facility] CT demonstrates consolidation of the right lower lobe. Multiple small nodules are present. Small nodule at the periphery of the right lower lobe was targeted.  Images during the case demonstrate placement of the needle tip into peripheral nodule at the right lung base.  Final CT image demonstrates trace pneumothorax with expected hemorrhage at the biopsy site.  IMPRESSION: Status post CT-guided biopsy of right lower lobe lung nodule. Tissue specimen sent to pathology for complete histopathologic analysis.  Signed,  Dulcy Fanny. Earleen Newport, DO  [facility]  [facility]   Electronically Signed   By: Corrie Mckusick D.O.   On: 10/23/2014 16:38    ASSESSMENT: This is a very pleasant 81 years old white female recently diagnosed with at least a stage IIIa (T3, N1, M0) non-small cell lung cancer, adenocarcinoma in a patient with remote history of smoking. She has multiple satellite nodules that would place her at a higher stage.   PLAN: I had a lengthy discussion with the patient and her  family today about her current disease status, prognosis and treatment options. I recommended for the patient to complete the staging workup by ordering a MRI of the brain to rule out brain metastasis. I also explained to the patient that with her remote smoking history there is a high probability that her tumor could harbor a genetic mutation that can be treated with targeted therapy. I will wait for the molecular studies to become available before making a decision about her treatment options. I briefly discussed with the patient her treatment options including palliative care and hospice referral versus consideration of targeted therapy if she has a genetic mutation versus systemic chemotherapy with carboplatin and Alimta or consideration of enrollment in the BMS checkmated 370 clinical trial. I will arrange for the patient to come back for follow-up visit in one week  for reevaluation and more detailed discussion of her treatment options based on the molecular study and MRI of the brain. I gave the patient and her family the time to ask questions and answered them completely to their satisfaction. The patient was advised to call immediately if she has any concerning symptoms in the interval. The patient was seen during the multidisciplinary thoracic oncology clinic today by medical oncology, thoracic navigator, physical therapist and social worker.  The patient voices understanding of current disease status and treatment options and is in agreement with the current care plan.  All questions were answered. The patient knows to call the clinic with any problems, questions or concerns. We can certainly see the patient much sooner if necessary.  Thank you so much for allowing me to participate in the care of [name] Carlson. I will continue to follow up the patient with you and assist in her care.  I spent 55 minutes counseling the patient face to face. The total time spent in the appointment was 80  minutes.  Disclaimer: This note was dictated with voice recognition software. Similar sounding words can inadvertently be transcribed and may not be corrected upon review.   Octavia Velador K. November 15, 2014, 2:44 PM

## 2014-11-15 NOTE — Therapy (Signed)
[place], [place], [address] Phone: [phone]   Fax:  [phone]  Physical Therapy Evaluation  Patient Details  Name: [name] Carlson MRN: [id number] Date of Birth: April 17, 1923 Referring Provider:  Curt Bears, MD  Encounter Date: 11/15/2014      PT End of Session - 11/15/14 1637    Visit Number 1   Number of Visits 9   Date for PT Re-Evaluation 01/14/15   PT Start Time 1455   PT Stop Time 1520   PT Time Calculation (min) 25 min   Activity Tolerance Patient tolerated treatment well   Behavior During Therapy [facility] for tasks assessed/performed      Past Medical History  Diagnosis Date  . Hypertension   . External carotid artery stenosis     L side dx'd with doppler 2012  . UTI (lower urinary tract infection)   . Pneumonia   . Thyroid disease     Past Surgical History  Procedure Laterality Date  . Video bronchoscopy Bilateral 04/06/2014    Procedure: VIDEO BRONCHOSCOPY WITH FLUORO;  Surgeon: Juanito Doom, MD;  Location: [place];  Service: Cardiopulmonary;  Laterality: Bilateral;  . Eye surgery      There were no vitals filed for this visit.  Visit Diagnosis:  Shortness of breath on exertion - Plan: PT plan of care cert/re-cert  Balance problem - Plan: PT plan of care cert/re-cert      Subjective Assessment - 11/15/14 1625    Subjective Complains of "not feeling ambitious anymore," or fatigue; also has a cough and shortness of breath sometimes   Patient is accompained by: Family member  daughter Daine Floras and dtr.-in-law Manus Gunning   Pertinent History Pt. presented with cough 02/2014; workup showed right lower lobe consolidation; followed 03/2014 and 08/2014 with chest CTs, then 11/13/14 with PET.  Tissue diagnosis of adenocarcinoma, probably stage III or IV; she will have a brain MRI and then either oral medicaiton for genetic mutation or chemotherapy.  Ex-smoker who quit in 1961; HTN,  external carotid artery stenosis; hypothyroid.                                                        Currently in Pain? No/denies            [facility] PT Assessment - 11/15/14 0001    Assessment   Medical Diagnosis right lower lobe adenocarcinoma with positive nodes   Precautions   Precautions Other (comment)   Precaution Comments cancer precautions; possible fall risk   Restrictions   Weight Bearing Restrictions No   Balance Screen   Has the patient fallen in the past 6 months No   Has the patient had a decrease in activity level because of a fear of falling?  No   Is the patient reluctant to leave their home because of a fear of falling?  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   Leisure no regular exercise; does volunteer, drives, play cards, does shopping with help   Observation/Other Assessments   Observations older woman looking younger than her stated age   Functional Tests   Functional tests Sit to Stand   Sit to Stand   Comments 6 times in 30 seconds (low)   Posture/Postural Control   Posture/Postural Control No significant limitations   ROM / Strength   AROM / PROM / Strength AROM   AROM   Overall AROM Comments trunk AROM in standing WFL but patient with mild losses of balance when performing this   Ambulation/Gait   Ambulation/Gait Yes   Ambulation/Gait Assistance 6: Modified independent (Device/Increase time)  difficulty with stairs or distance walking; "wobbly," per dt   Balance   Balance Assessed Yes   Dynamic Standing Balance   Dynamic Standing - Comments reaches forward 12  inches in standing without loss of balance                           PT Education - 11-18-14 1636    Education provided Yes   Education Details posture, breathing, walking, energy conservation, physical therapy info   Person(s) Educated Patient;Child(ren)   Methods Explanation;Handout   Comprehension Verbalized understanding               Lung Clinic Goals - Nov 18, 2014 1641    Patient will be able to verbalize understanding of the benefit of exercise to decrease fatigue.   Status Achieved   Patient will be able to verbalize the importance of posture.   Status Achieved   Patient will be able to demonstrate diaphragmatic breathing for improved lung function.   Status Achieved   Patient will be able to verbalize understanding of the role of physical therapy to prevent functional decline and who to contact if physical therapy is needed.   Status Achieved         Long Term Clinic Goals - 11-18-14 1643    CC Long Term Goal  #1   Title Pt. will be independent in a home exercise program for improved balance.   Time 4   Period Weeks   Status New   CC Long Term Goal  #2   Title Patient will be independent in a home exercise program for improved endurance and walking.   Time 4   Period Weeks   Status New            Plan - November 18, 2014 1637    Clinical Impression Statement Patient with newly diagnosed lung adenocarcinoma has limitation in endurance and appears to have impaired balance; her daughter expresses a desire for the patient to have some therapy to address these issues.   Pt will benefit from skilled therapeutic intervention in order to improve on the following deficits Cardiopulmonary status limiting activity;Decreased endurance;Decreased balance   Rehab Potential Good   PT Frequency 2x / week   PT Duration 4 weeks   PT Treatment/Interventions Patient/family education;Balance training;Therapeutic exercise   PT Next Visit Plan Patient would benefit  from further balance testing (Berg, dynamic gait index, or other); from endurance exercise and home exercise program instruction.   PT Home Exercise Plan see education section   Consulted and Agree with Plan of Care Patient;Family member/caregiver          G-Codes - November 18, 2014 1641    Functional Assessment Tool Used clinical judgement   Functional Limitation Mobility: Walking and  moving around   Mobility: Walking and Moving Around Current Status [phone]) At least 40 percent but less than 60 percent impaired, limited or restricted   Mobility: Walking and Moving Around Goal Status [phone]) At least 20 percent but less than 40 percent impaired, limited or restricted       Problem List Patient Active Problem List   Diagnosis Date Noted  . Non-small cell cancer of right lung 11/15/2014  . Fatigue 09/21/2014  . Abnormal chest CT 03/29/2014  . Cough 03/29/2014    SALISBURY,DONNA 11/15/2014, 4:47 PM  [place], [place], [address] Phone: [phone]   Fax:  [place], PT 11/15/2014 4:47 PM

## 2014-11-15 NOTE — Progress Notes (Signed)
[facility] Clinical Social Work  Clinical Social Work met with patient/family and Futures trader at [facility] appointment to offer support and assess for psychosocial needs.  Medical oncologist reviewed patient's diagnosis and recommended treatment plan with patient/family.  Ms. Wisman was accompanied by her daughter and daughter in law.  She lives independently and has support from her family.  Ms. Diekman was concerned with taking chemo treatment and main goal was to maintain her independence.    ONCBCN DISTRESS SCREENING 11/15/2014  Distress experienced in past week (1-10) 4  Physical Problem type Constipation/diarrhea  Physician notified of physical symptoms Yes  Referral to clinical social work No    Clinical Social Work briefly discussed [place] [facility] Work role and [employer] support programs/services.  Clinical Social Work encouraged patient to call with any additional questions or concerns.   Polo Riley, MSW, LCSW, OSW-C Clinical Social Worker [facility] [phone]

## 2014-11-16 ENCOUNTER — Encounter: Payer: Self-pay | Admitting: *Deleted

## 2014-11-16 NOTE — Progress Notes (Signed)
Oncology Nurse Navigator Documentation  Oncology Nurse Navigator Flowsheets 11/16/2014  Referral date to RadOnc/MedOnc   Navigator Encounter Type Clinic/MDC  Patient Visit Type Initial  Treatment Phase Other/Per tx  Barriers/Navigation Needs Education  Education Understanding Cancer/ Treatment Options  Interventions Education Method  Coordination of Care Other/Education   Education Method Written and Verbal  Support Groups/Services Friends and Family  Time Spent with Patient 15         Thoracic Treatment Summary Name:[name] Carlson Date:11/16/2014 DOB:[birth year] Your Medical Team Medical Oncologist:Dr. Mohamed Surgeon: Dr. Servando Snare  Type and Stage of Lung Cancer Non-Small Cell Carcinoma: Adenocarcinoma  Clinical Stage:  Non-small cell cancer of right lung   Staging form: Lung, AJCC 7th Edition     Clinical stage from 11/15/2014: Stage IIIA (T3, N1, M0) - Signed by Curt Bears, MD on 11/15/2014    Clinical stage is based on radiology exams.  Pathological stage will be determined after surgery.  Staging is based on the size of the tumor, involvement of lymph nodes or not, and whether or not the cancer center has spread. Recommendations Recommendations: Either biologic or chemotherapy   These recommendations are based on information available as of today's consult.  This is subject to change depending further testing or exams. Next Steps Next Step: Medical Oncology will set up follow up appointments  11/23/14 Follow up appt with Dr. Julien Nordmann  Barriers to Care What do you perceive as a potential barrier that may prevent you from receiving your treatment plan? Education-information on lung cancer,tx options, and side effects of this treatment given and explained  Support-information on resources at [employer] given Nutrition-information given   Resources Given: NCI Booklet on [employer] at [employer].Radonna Ricker  [phone] Fall Risk information Healthy Eating form [employer]    Questions Norton Blizzard, RN BSN Thoracic Oncology Nurse Navigator at [place] is a nurse navigator that is available to assist you through your cancer journey.  She can answer your questions and/or provide resources regarding your treatment plan, emotional support, or financial concerns.

## 2014-11-20 ENCOUNTER — Ambulatory Visit (HOSPITAL_COMMUNITY)
Admission: RE | Admit: 2014-11-20 | Discharge: 2014-11-20 | Disposition: A | Discharge status: Home / Self Care | Payer: Medicare Other | Source: Ambulatory Visit | Attending: Internal Medicine | Admitting: Internal Medicine

## 2014-11-20 DIAGNOSIS — I739 Peripheral vascular disease, unspecified: Secondary | ICD-10-CM | POA: Insufficient documentation

## 2014-11-20 DIAGNOSIS — I10 Essential (primary) hypertension: Secondary | ICD-10-CM | POA: Diagnosis not present

## 2014-11-20 DIAGNOSIS — Z961 Presence of intraocular lens: Secondary | ICD-10-CM | POA: Diagnosis not present

## 2014-11-20 DIAGNOSIS — D164 Benign neoplasm of bones of skull and face: Secondary | ICD-10-CM | POA: Insufficient documentation

## 2014-11-20 DIAGNOSIS — C3491 Malignant neoplasm of unspecified part of right bronchus or lung: Secondary | ICD-10-CM | POA: Insufficient documentation

## 2014-11-20 MED ORDER — GADOBENATE DIMEGLUMINE 529 MG/ML IV SOLN: 15.0000 mL | Freq: Once | INTRAVENOUS | Status: AC | PRN

## 2014-11-21 LAB — GUARDANT 360

## 2014-11-23 ENCOUNTER — Ambulatory Visit (HOSPITAL_BASED_OUTPATIENT_CLINIC_OR_DEPARTMENT_OTHER): Payer: Medicare Other | Admitting: Internal Medicine

## 2014-11-23 ENCOUNTER — Encounter: Payer: Self-pay | Admitting: Internal Medicine

## 2014-11-23 VITALS — BP 166/65 | HR 65 | Temp 97.8°F | Resp 18 | Ht 63.0 in | Wt 150.8 lb

## 2014-11-23 DIAGNOSIS — C3431 Malignant neoplasm of lower lobe, right bronchus or lung: Secondary | ICD-10-CM

## 2014-11-23 DIAGNOSIS — C3491 Malignant neoplasm of unspecified part of right bronchus or lung: Secondary | ICD-10-CM

## 2014-11-23 NOTE — Progress Notes (Signed)
[facility] Telephone:([phone]) [phone]   Fax:([phone]) [phone]  OFFICE PROGRESS NOTE  Carlson, [name] Bachelor, MD [address], [address] [place] [address]  DIAGNOSIS: Stage IIIA (T3, N1, M0) non-small cell lung cancer, adenocarcinoma with negative EGFR mutation and negative for gene translocation diagnosed in May 2016 in a patient with remote history of smoking. She has multiple satellite nodules that would place her at a higher stage.  [facility] Molecular study: positive for BRCA2, NOTCH1, RAF1 and NF![name]  PRIOR THERAPY: None  CURRENT THERAPY: None  INTERVAL HISTORY: [name] Carlson 81 y.o. female returns to the clinic today for follow-up visit accompanied by her daughter and son. The patient is feeling fine today with no specific complaints except for mild fatigue. She also has mild shortness breath with exertion but no significant chest pain, cough or hemoptysis. The patient denied having any significant weight loss or night sweats. She has no fever or chills. She has no significant nausea or vomiting. She had recent molecular studies performed by [facility] and it showed no positive EGFR mutation or ALK gene translocation. The patient is here today for evaluation and discussion of her treatment options based on the recent molecular studies.  MEDICAL HISTORY: Past Medical History  Diagnosis Date  . Hypertension   . External carotid artery stenosis     L side dx'd with doppler 2012  . UTI (lower urinary tract infection)   . Pneumonia   . Thyroid disease     ALLERGIES:  is allergic to corticosteroids and minocycline.  MEDICATIONS:  Current Outpatient Prescriptions  Medication Sig Dispense Refill  . aspirin 81 MG tablet Take 81 mg by mouth daily.    [name] doxazosin (CARDURA) 4 MG tablet Take 4 mg by mouth daily.     [name] levothyroxine (SYNTHROID, LEVOTHROID) 50 MCG tablet Take 50 mcg by mouth daily before breakfast.    . losartan (COZAAR) 100 MG tablet Take 100 mg  by mouth daily.    [name] OVER THE COUNTER MEDICATION     . verapamil (VERELAN PM) 240 MG 24 hr capsule Take 240 mg by mouth at bedtime.     No current facility-administered medications for this visit.    SURGICAL HISTORY:  Past Surgical History  Procedure Laterality Date  . Video bronchoscopy Bilateral 04/06/2014    Procedure: VIDEO BRONCHOSCOPY WITH FLUORO;  Surgeon: Juanito Doom, MD;  Location: [facility];  Service: Cardiopulmonary;  Laterality: Bilateral;  . Eye surgery      REVIEW OF SYSTEMS:  Constitutional: positive for fatigue Eyes: negative Ears, nose, mouth, throat, and face: negative Respiratory: positive for dyspnea on exertion Cardiovascular: negative Gastrointestinal: negative Genitourinary:negative Integument/breast: negative Hematologic/lymphatic: negative Musculoskeletal:negative Neurological: negative Behavioral/Psych: negative Endocrine: negative Allergic/Immunologic: negative   PHYSICAL EXAMINATION: General appearance: alert, cooperative, fatigued and no distress Head: Normocephalic, without obvious abnormality, atraumatic Neck: no adenopathy, no JVD, supple, symmetrical, trachea midline and thyroid not enlarged, symmetric, no tenderness/mass/nodules Lymph nodes: Cervical, supraclavicular, and axillary nodes normal. Resp: rales RLL Back: symmetric, no curvature. ROM normal. No CVA tenderness. Cardio: regular rate and rhythm, S1, S2 normal, no murmur, click, rub or gallop GI: soft, non-tender; bowel sounds normal; no masses,  no organomegaly Extremities: extremities normal, atraumatic, no cyanosis or edema Neurologic: Alert and oriented X 3, normal strength and tone. Normal symmetric reflexes. Normal coordination and gait  ECOG PERFORMANCE STATUS: 1 - Symptomatic but completely ambulatory  Blood pressure 166/65, pulse 65, temperature 97.8 F (36.6  C), temperature source Oral, resp. rate 18, height '5\' 3"'  (1.6 m), weight 150 lb 12.8 oz (68.402 kg), SpO2  97 %.  LABORATORY DATA: Lab Results  Component Value Date   WBC 5.6 11/15/2014   HGB 10.2* 11/15/2014   HCT 30.8* 11/15/2014   MCV 94.2 11/15/2014   PLT 186 11/15/2014      Chemistry      Component Value Date/Time   NA 134* 11/15/2014 1349   NA 133* 09/10/2014 1439   K 4.5 11/15/2014 1349   K 3.9 09/10/2014 1439   CL 99 09/10/2014 1439   CO2 23 11/15/2014 1349   CO2 23 09/10/2014 1439   BUN 22.1 11/15/2014 1349   BUN 13 09/10/2014 1439   CREATININE 0.9 11/15/2014 1349   CREATININE 0.79 09/10/2014 1439      Component Value Date/Time   CALCIUM 8.9 11/15/2014 1349   CALCIUM 9.2 09/10/2014 1439   ALKPHOS 78 11/15/2014 1349   ALKPHOS 91 09/04/2014 1924   AST 16 11/15/2014 1349   AST 17 09/04/2014 1924   ALT 7 11/15/2014 1349   ALT 11 09/04/2014 1924   BILITOT 0.52 11/15/2014 1349   BILITOT 0.9 09/04/2014 1924       RADIOGRAPHIC STUDIES: Mr Kizzie Fantasia Contrast  Nov 21, 2014   CLINICAL DATA:  81 year old hypertensive female with history of lung cancer. Staging exam. Initial encounter.  EXAM: MRI HEAD WITHOUT AND WITH CONTRAST  TECHNIQUE: Multiplanar, multiecho pulse sequences of the brain and surrounding structures were obtained without and with intravenous contrast.  CONTRAST:  15 cc MultiHance.  COMPARISON:  01/08/2006 head CT.  No comparison brain MR.  FINDINGS: No intracranial enhancing lesion or bony destructive lesion to suggest presence of intracranial metastatic disease.  No acute infarct.  No intracranial hemorrhage.  Osteoma left temporal-frontal calvarium unchanged and felt to be an incidental finding.  Global mild atrophy.  No hydrocephalus.  Mild small vessel disease type changes.  Atherosclerotic type changes right vertebral artery. Major intracranial vascular structures are patent.  Post lens replacement.  Cervical medullary junction unremarkable.  IMPRESSION: No evidence of intracranial metastatic disease.  No acute infarct.  Mild small vessel disease type changes.   Atherosclerotic type changes right vertebral artery suspected.   Electronically Signed   By: Genia Del M.D.   On: 2014/11/21 12:54   Nm Pet Image Initial (pi) Skull Base To Thigh  11/13/2014   CLINICAL DATA:  Initial treatment strategy for Lung cancer.  EXAM: NUCLEAR MEDICINE PET SKULL BASE TO THIGH  TECHNIQUE: 7.4 mCi F-18 FDG was injected intravenously. Full-ring PET imaging was performed from the skull base to thigh after the radiotracer. CT data was obtained and used for attenuation correction and anatomic localization.  FASTING BLOOD GLUCOSE:  Value: 92 mg/dl  COMPARISON:  CT 08/31/2014.  FINDINGS: NECK  No hypermetabolic lymph nodes in the neck.  CHEST  No hypermetabolic mediastinal or hilar nodes. Hypermetabolic tumor within the posterior right lung base measures 8.6 x 4.6 by 6.9 cm. The SUV max associated with this mass is equal to 8.47. Hypermetabolic right hilar lymph node has an SUV max equal to 4.0. Numerous small nodules are identified in the right lower lobe. In the right lung base there is a small subpleural nodule measuring 4 mm, image 41/series 4. This is too small to characterize by PET-CT. Also in the right lower lobe is a 5 mm nodule, image 40/series 6.  ABDOMEN/PELVIS  No abnormal hypermetabolic activity within the liver, pancreas, or spleen.  Mild asymmetric uptake in the left adrenal gland has an SUV max equal to 3.0. There is mild nodular enlargement measuring 1.2 x 0.8 cm, image 97/series 4. Normal appearance of the right adrenal gland. No hypermetabolic lymph nodes in the abdomen or pelvis. Aortic atherosclerosis noted.  SKELETON  No focal hypermetabolic activity to suggest skeletal metastasis.  IMPRESSION: 1. Right lower lobe confluent lung mass is intensely hypermetabolic compatible with primary bronchogenic carcinoma. Assuming non-small cell lung histology this would be consistent with at least T3N1Mx or stage IIIa. 2. Numerous small nodules are identified within the right lower  lobe consistent with satellite nodules. 3. Hypermetabolic right hilar lymph node worrisome for tip see lateral hilar lymph node metastasis. 4. Nonspecific FDG uptake is associated with the mildly enlarged left adrenal gland. This is equivocal for adrenal gland metastasis. Further assessment with contrast enhanced adrenal gland protocol MRI may be helpful.   Electronically Signed   By: Kerby Moors M.D.   On: 11/13/2014 12:11    ASSESSMENT AND PLAN: This is a very pleasant 81 years old white female with stage IIIA/B non-small cell lung cancer, adenocarcinoma involving the right lower lobe with hilar lymphadenopathy as well as several satellite lesions. The molecular study showed no evidence for target mutations. I had a lengthy discussion with the patient and her family today about her current disease status and treatment options. The volume of her disease is currently large for the patient to receive a course of concurrent chemoradiation. I will the patient the option of palliative care and close monitoring versus proceeding with systemic chemotherapy with carboplatin for AUC of 5 and Alimta 500 MG/M2 every 3 weeks. I discussed with the patient adverse effect of the chemotherapy including but not limited to alopecia, myelosuppression, nausea and vomiting, peripheral neuropathy, liver or renal dysfunction. The patient and her family would like to have some time to think about her options before making a final decision. She is expected to call within the next few days with her final decision regarding proceeding with treatment or palliative care. I will arrange her follow-up appointment and further visit based on her final decision. She was advised to call immediately if she has any concerning symptoms in the interval. The patient voices understanding of current disease status and treatment options and is in agreement with the current care plan.  All questions were answered. The patient knows to call the  clinic with any problems, questions or concerns. We can certainly see the patient much sooner if necessary.  I spent 15 minutes counseling the patient face to face. The total time spent in the appointment was 25 minutes.  Disclaimer: This note was dictated with voice recognition software. Similar sounding words can inadvertently be transcribed and may not be corrected upon review.

## 2014-11-26 ENCOUNTER — Other Ambulatory Visit: Payer: Self-pay | Admitting: Internal Medicine

## 2014-11-26 ENCOUNTER — Telehealth: Payer: Self-pay | Admitting: *Deleted

## 2014-11-26 DIAGNOSIS — C3491 Malignant neoplasm of unspecified part of right bronchus or lung: Secondary | ICD-10-CM

## 2014-11-26 NOTE — Telephone Encounter (Signed)
Ok. I will create a care plan and follow up.

## 2014-11-26 NOTE — Telephone Encounter (Signed)
VM message received @ 9:50 am requesting call back from Dr. Julien Nordmann regarding chemotherapy treatment.  # [phone]

## 2014-11-26 NOTE — Telephone Encounter (Signed)
She has decided to go ahead and have chemotherapy .

## 2014-11-27 ENCOUNTER — Telehealth: Payer: Self-pay | Admitting: *Deleted

## 2014-11-27 ENCOUNTER — Other Ambulatory Visit: Payer: Self-pay | Admitting: Internal Medicine

## 2014-11-27 ENCOUNTER — Telehealth: Payer: Self-pay | Admitting: Internal Medicine

## 2014-11-27 DIAGNOSIS — C3491 Malignant neoplasm of unspecified part of right bronchus or lung: Secondary | ICD-10-CM

## 2014-11-27 MED ORDER — CYANOCOBALAMIN 1000 MCG/ML IJ SOLN: 1000.0000 ug | Freq: Once | INTRAMUSCULAR | Status: DC

## 2014-11-27 MED ORDER — DEXAMETHASONE 4 MG PO TABS: ORAL_TABLET | ORAL | Status: DC

## 2014-11-27 MED ORDER — PROCHLORPERAZINE MALEATE 10 MG PO TABS: 10.0000 mg | ORAL_TABLET | Freq: Four times a day (QID) | ORAL | Status: DC | PRN

## 2014-11-27 MED ORDER — FOLIC ACID 1 MG PO TABS: 1.0000 mg | ORAL_TABLET | Freq: Every day | ORAL | Status: DC

## 2014-11-27 NOTE — Telephone Encounter (Signed)
Please arrange for B12 injection at the time of the chemo class.

## 2014-11-27 NOTE — Telephone Encounter (Signed)
Voicemail received from patient requesting a call.  "I would like to receive a call.  I have questions about the treatment I'm to receive next week." Called patient who asked "1. when she will receive the Vitamin B12 injection.  2. My daughter in [place] mother had cancer and she is familiar with her routine before she passed almost twenty years ago and we're not sure if this is the same routine.  3. What is the button that can be placed in my chest or will you all use my arm?" Informed her the injection will be given in the infusion room.  Do not compare your routine with anyone else's as new drugs and treatment regimens are approved often.  Treatments vary per diagnosis and stage so no comparison can be made.  Advised to drink lots of water and squeeze arms 24 hrs in advance to make sure the veins are plump best vein selection.  If veins are problematic, she can tell the nurse to ask Dr. Julien Nordmann for an order.  No further questions.

## 2014-11-27 NOTE — Telephone Encounter (Signed)
s.w. pt and advised on JUNE and JULY appt....pt ok and aware .Marland KitchenMarland KitchenMarland Kitchenpt will get full sched at 6.21 visit

## 2014-11-28 ENCOUNTER — Other Ambulatory Visit: Payer: Self-pay | Admitting: Medical Oncology

## 2014-11-28 NOTE — Telephone Encounter (Signed)
Called patient to notify her Dr. Julien Nordmann has ordered for her to receive the B12 injection before education class.  Will notify schedulers and they will call with appointment time for injection.  Thanked me for letting her know.

## 2014-11-28 NOTE — Telephone Encounter (Signed)
P.O.F. entered requesting injection appointment before education class.  An 8:45 or 9:15 is currently available but asked scheduler to call patient

## 2014-12-04 ENCOUNTER — Other Ambulatory Visit: Payer: Medicare Other

## 2014-12-04 ENCOUNTER — Ambulatory Visit (HOSPITAL_BASED_OUTPATIENT_CLINIC_OR_DEPARTMENT_OTHER): Payer: Medicare Other

## 2014-12-04 ENCOUNTER — Ambulatory Visit: Payer: Medicare Other

## 2014-12-04 ENCOUNTER — Encounter: Payer: Self-pay | Admitting: *Deleted

## 2014-12-04 ENCOUNTER — Telehealth: Payer: Self-pay | Admitting: *Deleted

## 2014-12-04 ENCOUNTER — Other Ambulatory Visit (HOSPITAL_BASED_OUTPATIENT_CLINIC_OR_DEPARTMENT_OTHER): Payer: Medicare Other

## 2014-12-04 VITALS — BP 165/59 | HR 83 | Temp 98.5°F | Resp 20

## 2014-12-04 VITALS — BP 162/51 | HR 47 | Temp 97.5°F | Resp 18

## 2014-12-04 DIAGNOSIS — C3431 Malignant neoplasm of lower lobe, right bronchus or lung: Secondary | ICD-10-CM

## 2014-12-04 DIAGNOSIS — C3491 Malignant neoplasm of unspecified part of right bronchus or lung: Secondary | ICD-10-CM

## 2014-12-04 DIAGNOSIS — Z5111 Encounter for antineoplastic chemotherapy: Secondary | ICD-10-CM | POA: Diagnosis not present

## 2014-12-04 LAB — CBC WITH DIFFERENTIAL/PLATELET
BASO%: 0.1 % (ref 0.0–2.0)
Basophils Absolute: 0 10*3/uL (ref 0.0–0.1)
EOS%: 0 % (ref 0.0–7.0)
Eosinophils Absolute: 0 10*3/uL (ref 0.0–0.5)
HCT: 34.8 % (ref 34.8–46.6)
HGB: 11.7 g/dL (ref 11.6–15.9)
LYMPH%: 11.5 % — ABNORMAL LOW (ref 14.0–49.7)
MCH: 31.4 pg (ref 25.1–34.0)
MCHC: 33.5 g/dL (ref 31.5–36.0)
MCV: 93.9 fL (ref 79.5–101.0)
MONO#: 0.4 10*3/uL (ref 0.1–0.9)
MONO%: 6.2 % (ref 0.0–14.0)
NEUT#: 5.3 10*3/uL (ref 1.5–6.5)
NEUT%: 82.2 % — ABNORMAL HIGH (ref 38.4–76.8)
PLATELETS: 167 10*3/uL (ref 145–400)
RBC: 3.71 10*6/uL (ref 3.70–5.45)
RDW: 14.4 % (ref 11.2–14.5)
WBC: 6.5 10*3/uL (ref 3.9–10.3)
lymph#: 0.7 10*3/uL — ABNORMAL LOW (ref 0.9–3.3)

## 2014-12-04 LAB — COMPREHENSIVE METABOLIC PANEL (CC13)
ALT: 209 U/L — AB (ref 0–55)
ANION GAP: 9 meq/L (ref 3–11)
AST: 201 U/L (ref 5–34)
Albumin: 4 g/dL (ref 3.5–5.0)
Alkaline Phosphatase: 92 U/L (ref 40–150)
BILIRUBIN TOTAL: 0.46 mg/dL (ref 0.20–1.20)
BUN: 22.3 mg/dL (ref 7.0–26.0)
CALCIUM: 9.7 mg/dL (ref 8.4–10.4)
CHLORIDE: 104 meq/L (ref 98–109)
CO2: 23 mEq/L (ref 22–29)
Creatinine: 0.8 mg/dL (ref 0.6–1.1)
EGFR: 66 mL/min/{1.73_m2} — ABNORMAL LOW (ref >90)
GLUCOSE: 83 mg/dL (ref 70–140)
Potassium: 3.9 mEq/L (ref 3.5–5.1)
Sodium: 136 mEq/L (ref 136–145)
Total Protein: 7.3 g/dL (ref 6.4–8.3)

## 2014-12-04 MED ORDER — CYANOCOBALAMIN 1000 MCG/ML IJ SOLN
1000.0000 ug | Freq: Once | INTRAMUSCULAR | Status: AC
  Administered 2014-12-04: 1000 ug via INTRAMUSCULAR

## 2014-12-04 MED ORDER — CLONIDINE HCL 0.1 MG PO TABS
0.2000 mg | ORAL_TABLET | Freq: Once | ORAL | Status: AC
  Administered 2014-12-04: 0.2 mg via ORAL

## 2014-12-04 MED ORDER — SODIUM CHLORIDE 0.9 % IV SOLN
Freq: Once | INTRAVENOUS | Status: AC
  Administered 2014-12-04: 12:00:00 via INTRAVENOUS
  Filled 2014-12-04: qty 8

## 2014-12-04 MED ORDER — SODIUM CHLORIDE 0.9 % IV SOLN
500.0000 mg/m2 | Freq: Once | INTRAVENOUS | Status: AC
  Administered 2014-12-04: 875 mg via INTRAVENOUS
  Filled 2014-12-04: qty 35

## 2014-12-04 MED ORDER — SODIUM CHLORIDE 0.9 % IV SOLN
250.0000 mL | Freq: Once | INTRAVENOUS | Status: AC
  Administered 2014-12-04: 250 mL via INTRAVENOUS

## 2014-12-04 MED ORDER — CARBOPLATIN CHEMO INJECTION 450 MG/45ML
330.0000 mg | Freq: Once | INTRAVENOUS | Status: AC
  Administered 2014-12-04: 330 mg via INTRAVENOUS
  Filled 2014-12-04: qty 33

## 2014-12-04 MED ORDER — CLONIDINE HCL 0.1 MG PO TABS
ORAL_TABLET | ORAL | Status: AC
  Filled 2014-12-04: qty 2

## 2014-12-04 NOTE — Telephone Encounter (Signed)
Pt here for treatment C1/D1 Carbo/Alimta, Labs reviewed with MD- AST 201, ALT 209 " ok to treat" notified pharmacy.

## 2014-12-04 NOTE — Patient Instructions (Signed)
[facility] Discharge Instructions for Patients Receiving Chemotherapy  Today you received the following chemotherapy agents Alimta and Carboplatin.  To help prevent nausea and vomiting after your treatment, we encourage you to take your nausea medication Compazine 10 mg every 6 hours as needed.   If you develop nausea and vomiting that is not controlled by your nausea medication, call the clinic.   BELOW ARE SYMPTOMS THAT SHOULD BE REPORTED IMMEDIATELY:  *FEVER GREATER THAN 100.5 F  *CHILLS WITH OR WITHOUT FEVER  NAUSEA AND VOMITING THAT IS NOT CONTROLLED WITH YOUR NAUSEA MEDICATION  *UNUSUAL SHORTNESS OF BREATH  *UNUSUAL BRUISING OR BLEEDING  TENDERNESS IN MOUTH AND THROAT WITH OR WITHOUT PRESENCE OF ULCERS  *URINARY PROBLEMS  *BOWEL PROBLEMS  UNUSUAL RASH Items with * indicate a potential emergency and should be followed up as soon as possible.  Feel free to call the clinic you have any questions or concerns. The clinic phone number is [phone].  Please show the [facility] at check-in to the Emergency Department and triage nurse.

## 2014-12-04 NOTE — Progress Notes (Signed)
BP 202/55 and 188/53 on recheck. Patient has not taken her BP meds yet today. Verbal order obtained by Dr. Julien Nordmann to give 0.2 mg clonidine now and recheck BP in 30 minutes. Patient informed and notified of potential side effects of this medication, she verbalizes understanding. MD also reviewed [facility], [place] to treat with AST 201 and ALT 209.

## 2014-12-05 ENCOUNTER — Telehealth: Payer: Self-pay | Admitting: *Deleted

## 2014-12-05 NOTE — Telephone Encounter (Signed)
Follow up call placed to pt. Pt states " I feel 100%, no nausea, i'm doing great." Encouraged pt to call office with any concerns. Pt verbalized understanding.

## 2014-12-05 NOTE — Telephone Encounter (Signed)
-----   Message from Domenic Schwab, RN sent at 12/04/2014  1:34 PM EDT ----- Regarding: Julien Nordmann; Chemo follow up call 1st Alimta/Carbo

## 2014-12-11 ENCOUNTER — Other Ambulatory Visit (HOSPITAL_BASED_OUTPATIENT_CLINIC_OR_DEPARTMENT_OTHER): Payer: Medicare Other

## 2014-12-11 ENCOUNTER — Encounter: Payer: Self-pay | Admitting: Physician Assistant

## 2014-12-11 ENCOUNTER — Ambulatory Visit (HOSPITAL_BASED_OUTPATIENT_CLINIC_OR_DEPARTMENT_OTHER): Payer: Medicare Other | Admitting: Physician Assistant

## 2014-12-11 VITALS — BP 128/53 | HR 60 | Temp 97.9°F | Resp 18 | Ht 63.0 in | Wt 150.5 lb

## 2014-12-11 DIAGNOSIS — C3491 Malignant neoplasm of unspecified part of right bronchus or lung: Secondary | ICD-10-CM

## 2014-12-11 DIAGNOSIS — R5383 Other fatigue: Secondary | ICD-10-CM

## 2014-12-11 DIAGNOSIS — C3431 Malignant neoplasm of lower lobe, right bronchus or lung: Secondary | ICD-10-CM

## 2014-12-11 DIAGNOSIS — K5900 Constipation, unspecified: Secondary | ICD-10-CM

## 2014-12-11 LAB — CBC WITH DIFFERENTIAL/PLATELET
BASO%: 0.3 % (ref 0.0–2.0)
BASOS ABS: 0 10*3/uL (ref 0.0–0.1)
EOS ABS: 0.3 10*3/uL (ref 0.0–0.5)
EOS%: 8.8 % — AB (ref 0.0–7.0)
HEMATOCRIT: 32.5 % — AB (ref 34.8–46.6)
HEMOGLOBIN: 10.8 g/dL — AB (ref 11.6–15.9)
LYMPH#: 0.8 10*3/uL — AB (ref 0.9–3.3)
LYMPH%: 20.3 % (ref 14.0–49.7)
MCH: 31.2 pg (ref 25.1–34.0)
MCHC: 33.3 g/dL (ref 31.5–36.0)
MCV: 93.6 fL (ref 79.5–101.0)
MONO#: 0.3 10*3/uL (ref 0.1–0.9)
MONO%: 8.6 % (ref 0.0–14.0)
NEUT%: 62 % (ref 38.4–76.8)
NEUTROS ABS: 2.3 10*3/uL (ref 1.5–6.5)
Platelets: 146 10*3/uL (ref 145–400)
RBC: 3.47 10*6/uL — ABNORMAL LOW (ref 3.70–5.45)
RDW: 14.1 % (ref 11.2–14.5)
WBC: 3.8 10*3/uL — AB (ref 3.9–10.3)

## 2014-12-11 LAB — COMPREHENSIVE METABOLIC PANEL (CC13)
ALT: 35 U/L (ref 0–55)
ANION GAP: 7 meq/L (ref 3–11)
AST: 17 U/L (ref 5–34)
Albumin: 3.2 g/dL — ABNORMAL LOW (ref 3.5–5.0)
Alkaline Phosphatase: 81 U/L (ref 40–150)
BUN: 18.9 mg/dL (ref 7.0–26.0)
CHLORIDE: 100 meq/L (ref 98–109)
CO2: 24 meq/L (ref 22–29)
Calcium: 8.8 mg/dL (ref 8.4–10.4)
Creatinine: 0.8 mg/dL (ref 0.6–1.1)
EGFR: 61 mL/min/{1.73_m2} — ABNORMAL LOW (ref >90)
Glucose: 97 mg/dl (ref 70–140)
Potassium: 4.5 mEq/L (ref 3.5–5.1)
Sodium: 131 mEq/L — ABNORMAL LOW (ref 136–145)
TOTAL PROTEIN: 6.1 g/dL — AB (ref 6.4–8.3)
Total Bilirubin: 0.98 mg/dL (ref 0.20–1.20)

## 2014-12-11 NOTE — Progress Notes (Signed)
[facility] [phone]   [phone]  OFFICE PROGRESS NOTE  GREEN, [name] Bachelor, MD [address], [address] [place] [address]  DIAGNOSIS: Stage IIIA (T3, N1, M0) non-small cell lung cancer, adenocarcinoma with negative EGFR mutation and negative for gene translocation diagnosed in May 2016 in a patient with remote history of smoking. She has multiple satellite nodules that would place her at a higher stage.  [facility] Molecular study: positive for BRCA2, NOTCH1, RAF1 and NF![name]  PRIOR THERAPY: None  CURRENT THERAPY: Systemic chemotherapy with carboplatin for an AUC of 5 and Alimta at 500 mg/m given every 3 weeks. Status post 1 cycle  INTERVAL HISTORY: [name] Carlson 81 y.o. female returns to the clinic today for follow-up visit accompanied by her daughter. He completed her first cycle of systemic chemotherapy with carboplatin and Alimta. She presents today for a symptom management visit. Overall she tolerated first cycle of chemotherapy relatively well with the exception of some fatigue and constipation that developed about 3 days after her chemotherapy. She reports some dizziness today but has a long-standing history of vertigo. She denied any issues with nausea vomiting or diarrhea. Her appetite remains good she is sleeping well. She continues to have her baseline shortness of breath with exertion. She denied cough, chest pain or hemoptysis The patient denied having any significant weight loss or night sweats. She has no fever or chills. She has no significant nausea or vomiting. She had recent molecular studies performed by [facility] and it showed no positive EGFR mutation or ALK gene translocation.   MEDICAL HISTORY: Past Medical History  Diagnosis Date  . Hypertension   . External carotid artery stenosis     L side dx'd with doppler 2012  . UTI (lower urinary tract infection)   . Pneumonia   . Thyroid disease     ALLERGIES:  is  allergic to corticosteroids and minocycline.  MEDICATIONS:  Current Outpatient Prescriptions  Medication Sig Dispense Refill  . aspirin 81 MG tablet Take 81 mg by mouth daily.    [name] dexamethasone (DECADRON) 4 MG tablet 4 mg po bid the day before, day of and day after chemotherapy 40 tablet 1  . doxazosin (CARDURA) 4 MG tablet Take 4 mg by mouth daily.     . folic acid (FOLVITE) 1 MG tablet Take 1 tablet (1 mg total) by mouth daily. 30 tablet 4  . levothyroxine (SYNTHROID, LEVOTHROID) 50 MCG tablet Take 50 mcg by mouth daily before breakfast.    . losartan (COZAAR) 100 MG tablet Take 100 mg by mouth daily.    [name] OVER THE COUNTER MEDICATION     . prochlorperazine (COMPAZINE) 10 MG tablet Take 1 tablet (10 mg total) by mouth every 6 (six) hours as needed for nausea or vomiting. 30 tablet 0  . verapamil (VERELAN PM) 240 MG 24 hr capsule Take 240 mg by mouth at bedtime.     No current facility-administered medications for this visit.    SURGICAL HISTORY:  Past Surgical History  Procedure Laterality Date  . Video bronchoscopy Bilateral 04/06/2014    Procedure: VIDEO BRONCHOSCOPY WITH FLUORO;  Surgeon: Juanito Doom, MD;  Location: [facility];  Service: Cardiopulmonary;  Laterality: Bilateral;  . Eye surgery      REVIEW OF SYSTEMS:  Constitutional: positive for fatigue Eyes: negative Ears, nose, mouth, throat, and face: negative Respiratory: positive for dyspnea on exertion Cardiovascular: negative Gastrointestinal: positive for constipation Genitourinary:negative Integument/breast:  negative Hematologic/lymphatic: negative Musculoskeletal:negative Neurological: negative Behavioral/Psych: negative Endocrine: negative Allergic/Immunologic: negative   PHYSICAL EXAMINATION: General appearance: alert, cooperative, fatigued and no distress Head: Normocephalic, without obvious abnormality, atraumatic Neck: no adenopathy, no JVD, supple, symmetrical, trachea midline and thyroid not  enlarged, symmetric, no tenderness/mass/nodules Lymph nodes: Cervical, supraclavicular, and axillary nodes normal. Resp: rales RLL Back: symmetric, no curvature. ROM normal. No CVA tenderness. Cardio: regular rate and rhythm, S1, S2 normal, no murmur, click, rub or gallop GI: soft, non-tender; bowel sounds normal; no masses,  no organomegaly Extremities: extremities normal, atraumatic, no cyanosis or edema Neurologic: Alert and oriented X 3, normal strength and tone. Normal symmetric reflexes. Normal coordination and gait  ECOG PERFORMANCE STATUS: 1 - Symptomatic but completely ambulatory  Blood pressure 128/53, pulse 60, temperature 97.9 F (36.6 C), temperature source Oral, resp. rate 18, height '5\' 3"'  (1.6 m), weight 150 lb 8 oz (68.266 kg), SpO2 96 %.  LABORATORY DATA: Lab Results  Component Value Date   WBC 3.8* 12/11/2014   HGB 10.8* 12/11/2014   HCT 32.5* 12/11/2014   MCV 93.6 12/11/2014   PLT 146 12/11/2014      Chemistry      Component Value Date/Time   NA 131* 12/11/2014 1141   NA 133* 09/10/2014 1439   K 4.5 12/11/2014 1141   K 3.9 09/10/2014 1439   CL 99 09/10/2014 1439   CO2 24 12/11/2014 1141   CO2 23 09/10/2014 1439   BUN 18.9 12/11/2014 1141   BUN 13 09/10/2014 1439   CREATININE 0.8 12/11/2014 1141   CREATININE 0.79 09/10/2014 1439      Component Value Date/Time   CALCIUM 8.8 12/11/2014 1141   CALCIUM 9.2 09/10/2014 1439   ALKPHOS 81 12/11/2014 1141   ALKPHOS 91 09/04/2014 1924   AST 17 12/11/2014 1141   AST 17 09/04/2014 1924   ALT 35 12/11/2014 1141   ALT 11 09/04/2014 1924   BILITOT 0.98 12/11/2014 1141   BILITOT 0.9 09/04/2014 1924       RADIOGRAPHIC STUDIES: Mr Kizzie Fantasia Contrast  2014-12-05   CLINICAL DATA:  81 year old hypertensive female with history of lung cancer. Staging exam. Initial encounter.  EXAM: MRI HEAD WITHOUT AND WITH CONTRAST  TECHNIQUE: Multiplanar, multiecho pulse sequences of the brain and surrounding structures were  obtained without and with intravenous contrast.  CONTRAST:  15 cc MultiHance.  COMPARISON:  01/08/2006 head CT.  No comparison brain MR.  FINDINGS: No intracranial enhancing lesion or bony destructive lesion to suggest presence of intracranial metastatic disease.  No acute infarct.  No intracranial hemorrhage.  Osteoma left temporal-frontal calvarium unchanged and felt to be an incidental finding.  Global mild atrophy.  No hydrocephalus.  Mild small vessel disease type changes.  Atherosclerotic type changes right vertebral artery. Major intracranial vascular structures are patent.  Post lens replacement.  Cervical medullary junction unremarkable.  IMPRESSION: No evidence of intracranial metastatic disease.  No acute infarct.  Mild small vessel disease type changes.  Atherosclerotic type changes right vertebral artery suspected.   Electronically Signed   By: Genia Del M.D.   On: Dec 05, 2014 12:54   Nm Pet Image Initial (pi) Skull Base To Thigh  11/13/2014   CLINICAL DATA:  Initial treatment strategy for Lung cancer.  EXAM: NUCLEAR MEDICINE PET SKULL BASE TO THIGH  TECHNIQUE: 7.4 mCi F-18 FDG was injected intravenously. Full-ring PET imaging was performed from the skull base to thigh after the radiotracer. CT data was obtained and used for attenuation correction  and anatomic localization.  FASTING BLOOD GLUCOSE:  Value: 92 mg/dl  COMPARISON:  CT 08/31/2014.  FINDINGS: NECK  No hypermetabolic lymph nodes in the neck.  CHEST  No hypermetabolic mediastinal or hilar nodes. Hypermetabolic tumor within the posterior right lung base measures 8.6 x 4.6 by 6.9 cm. The SUV max associated with this mass is equal to 8.47. Hypermetabolic right hilar lymph node has an SUV max equal to 4.0. Numerous small nodules are identified in the right lower lobe. In the right lung base there is a small subpleural nodule measuring 4 mm, image 41/series 4. This is too small to characterize by PET-CT. Also in the right lower lobe is a 5 mm  nodule, image 40/series 6.  ABDOMEN/PELVIS  No abnormal hypermetabolic activity within the liver, pancreas, or spleen. Mild asymmetric uptake in the left adrenal gland has an SUV max equal to 3.0. There is mild nodular enlargement measuring 1.2 x 0.8 cm, image 97/series 4. Normal appearance of the right adrenal gland. No hypermetabolic lymph nodes in the abdomen or pelvis. Aortic atherosclerosis noted.  SKELETON  No focal hypermetabolic activity to suggest skeletal metastasis.  IMPRESSION: 1. Right lower lobe confluent lung mass is intensely hypermetabolic compatible with primary bronchogenic carcinoma. Assuming non-small cell lung histology this would be consistent with at least T3N1Mx or stage IIIa. 2. Numerous small nodules are identified within the right lower lobe consistent with satellite nodules. 3. Hypermetabolic right hilar lymph node worrisome for tip see lateral hilar lymph node metastasis. 4. Nonspecific FDG uptake is associated with the mildly enlarged left adrenal gland. This is equivocal for adrenal gland metastasis. Further assessment with contrast enhanced adrenal gland protocol MRI may be helpful.   Electronically Signed   By: Kerby Moors M.D.   On: 11/13/2014 12:11    ASSESSMENT AND PLAN: This is a very pleasant 81 years old white female with stage IIIA/B non-small cell lung cancer, adenocarcinoma involving the right lower lobe with hilar lymphadenopathy as well as several satellite lesions. The molecular study showed no evidence for target mutations. She is currently being treated with systemic chemotherapy with carboplatin for AUC of 5 and Alimta 500 MG/M2 every 3 weeks. Status post 1 cycle. Overall she tolerated her first cycle systemic chemotherapy relatively well with the exception of some fatigue and constipation. The symptoms are all resolving. She has some dizziness but this is related to long-standing issue with vertigo and tends to be self-limiting. She will continue with weekly  labs as scheduled and follow-up in 2 weeks prior to the start of cycle #2.   She was advised to call immediately if she has any concerning symptoms in the interval. The patient voices understanding of current disease status and treatment options and is in agreement with the current care plan.  All questions were answered. The patient knows to call the clinic with any problems, questions or concerns. We can certainly see the patient much sooner if necessary.  Carlton Adam, PA-C 12/11/2014  Disclaimer: This note was dictated with voice recognition software. Similar sounding words can inadvertently be transcribed and may not be corrected upon review.

## 2014-12-13 NOTE — Patient Instructions (Signed)
Continue weekly labs as scheduled Followup in 2 weeks prior to the start of your next scheduled cycle of chemotherapy 

## 2014-12-14 ENCOUNTER — Telehealth: Payer: Self-pay | Admitting: Internal Medicine

## 2014-12-14 NOTE — Telephone Encounter (Signed)
pt called to confirm appt....pt ok and aware °

## 2014-12-18 ENCOUNTER — Other Ambulatory Visit (HOSPITAL_BASED_OUTPATIENT_CLINIC_OR_DEPARTMENT_OTHER): Payer: Medicare Other

## 2014-12-18 ENCOUNTER — Ambulatory Visit: Payer: Medicare Other | Admitting: Oncology

## 2014-12-18 ENCOUNTER — Telehealth: Payer: Self-pay

## 2014-12-18 DIAGNOSIS — C3491 Malignant neoplasm of unspecified part of right bronchus or lung: Secondary | ICD-10-CM

## 2014-12-18 LAB — CBC WITH DIFFERENTIAL/PLATELET
BASO%: 0.4 % (ref 0.0–2.0)
Basophils Absolute: 0 10*3/uL (ref 0.0–0.1)
EOS%: 7.6 % — ABNORMAL HIGH (ref 0.0–7.0)
Eosinophils Absolute: 0.3 10*3/uL (ref 0.0–0.5)
HCT: 29.8 % — ABNORMAL LOW (ref 34.8–46.6)
HGB: 10 g/dL — ABNORMAL LOW (ref 11.6–15.9)
LYMPH#: 0.7 10*3/uL — AB (ref 0.9–3.3)
LYMPH%: 19.8 % (ref 14.0–49.7)
MCH: 31.6 pg (ref 25.1–34.0)
MCHC: 33.6 g/dL (ref 31.5–36.0)
MCV: 94.1 fL (ref 79.5–101.0)
MONO#: 0.8 10*3/uL (ref 0.1–0.9)
MONO%: 25.7 % — ABNORMAL HIGH (ref 0.0–14.0)
NEUT#: 1.5 10*3/uL (ref 1.5–6.5)
NEUT%: 46.5 % (ref 38.4–76.8)
Platelets: 142 10*3/uL — ABNORMAL LOW (ref 145–400)
RBC: 3.17 10*6/uL — AB (ref 3.70–5.45)
RDW: 14.2 % (ref 11.2–14.5)
WBC: 3.3 10*3/uL — AB (ref 3.9–10.3)

## 2014-12-18 LAB — COMPREHENSIVE METABOLIC PANEL (CC13)
ALBUMIN: 3.3 g/dL — AB (ref 3.5–5.0)
ALT: 27 U/L (ref 0–55)
AST: 20 U/L (ref 5–34)
Alkaline Phosphatase: 100 U/L (ref 40–150)
Anion Gap: 9 mEq/L (ref 3–11)
BUN: 15.1 mg/dL (ref 7.0–26.0)
CALCIUM: 8.9 mg/dL (ref 8.4–10.4)
CHLORIDE: 104 meq/L (ref 98–109)
CO2: 22 meq/L (ref 22–29)
Creatinine: 0.8 mg/dL (ref 0.6–1.1)
EGFR: 62 mL/min/{1.73_m2} — ABNORMAL LOW (ref >90)
Glucose: 114 mg/dl (ref 70–140)
POTASSIUM: 4 meq/L (ref 3.5–5.1)
Sodium: 135 mEq/L — ABNORMAL LOW (ref 136–145)
TOTAL PROTEIN: 6.2 g/dL — AB (ref 6.4–8.3)
Total Bilirubin: 0.53 mg/dL (ref 0.20–1.20)

## 2014-12-18 NOTE — Telephone Encounter (Signed)
Pt filled out walk in form inquiring if she was okay to have a spot removed from her dermatologist next Monday before her chemo Tuesday. Spoke with Dr.Mohamed who states it is okay for her to have it removed. Informed pt and clarified concerns regarding this issue. Pt verbalizes understanding and denies any further questions or concerns at this time.

## 2014-12-25 ENCOUNTER — Ambulatory Visit (HOSPITAL_BASED_OUTPATIENT_CLINIC_OR_DEPARTMENT_OTHER): Payer: Medicare Other

## 2014-12-25 ENCOUNTER — Ambulatory Visit (HOSPITAL_BASED_OUTPATIENT_CLINIC_OR_DEPARTMENT_OTHER): Payer: Medicare Other | Admitting: Oncology

## 2014-12-25 ENCOUNTER — Encounter: Payer: Self-pay | Admitting: Oncology

## 2014-12-25 ENCOUNTER — Other Ambulatory Visit (HOSPITAL_BASED_OUTPATIENT_CLINIC_OR_DEPARTMENT_OTHER): Payer: Medicare Other

## 2014-12-25 VITALS — BP 140/49 | HR 68 | Temp 98.4°F | Resp 18 | Ht 63.0 in | Wt 150.4 lb

## 2014-12-25 DIAGNOSIS — Z5111 Encounter for antineoplastic chemotherapy: Secondary | ICD-10-CM

## 2014-12-25 DIAGNOSIS — C3431 Malignant neoplasm of lower lobe, right bronchus or lung: Secondary | ICD-10-CM

## 2014-12-25 DIAGNOSIS — C3491 Malignant neoplasm of unspecified part of right bronchus or lung: Secondary | ICD-10-CM

## 2014-12-25 LAB — COMPREHENSIVE METABOLIC PANEL (CC13)
ALBUMIN: 3.8 g/dL (ref 3.5–5.0)
ALT: 32 U/L (ref 0–55)
AST: 29 U/L (ref 5–34)
Alkaline Phosphatase: 105 U/L (ref 40–150)
Anion Gap: 12 mEq/L — ABNORMAL HIGH (ref 3–11)
BUN: 21.2 mg/dL (ref 7.0–26.0)
CO2: 19 mEq/L — ABNORMAL LOW (ref 22–29)
CREATININE: 0.8 mg/dL (ref 0.6–1.1)
Calcium: 9.8 mg/dL (ref 8.4–10.4)
Chloride: 101 mEq/L (ref 98–109)
EGFR: 62 mL/min/{1.73_m2} — ABNORMAL LOW (ref >90)
Glucose: 111 mg/dl (ref 70–140)
POTASSIUM: 4.3 meq/L (ref 3.5–5.1)
SODIUM: 132 meq/L — AB (ref 136–145)
TOTAL PROTEIN: 7.1 g/dL (ref 6.4–8.3)
Total Bilirubin: 0.39 mg/dL (ref 0.20–1.20)

## 2014-12-25 LAB — CBC WITH DIFFERENTIAL/PLATELET
BASO%: 0.3 % (ref 0.0–2.0)
Basophils Absolute: 0 10*3/uL (ref 0.0–0.1)
EOS%: 0 % (ref 0.0–7.0)
Eosinophils Absolute: 0 10*3/uL (ref 0.0–0.5)
HCT: 31.3 % — ABNORMAL LOW (ref 34.8–46.6)
HEMOGLOBIN: 10.7 g/dL — AB (ref 11.6–15.9)
LYMPH%: 13.9 % — AB (ref 14.0–49.7)
MCH: 31.9 pg (ref 25.1–34.0)
MCHC: 34 g/dL (ref 31.5–36.0)
MCV: 93.6 fL (ref 79.5–101.0)
MONO#: 0.5 10*3/uL (ref 0.1–0.9)
MONO%: 9 % (ref 0.0–14.0)
NEUT%: 76.8 % (ref 38.4–76.8)
NEUTROS ABS: 4.4 10*3/uL (ref 1.5–6.5)
Platelets: 337 10*3/uL (ref 145–400)
RBC: 3.34 10*6/uL — ABNORMAL LOW (ref 3.70–5.45)
RDW: 14.9 % — AB (ref 11.2–14.5)
WBC: 5.8 10*3/uL (ref 3.9–10.3)
lymph#: 0.8 10*3/uL — ABNORMAL LOW (ref 0.9–3.3)

## 2014-12-25 MED ORDER — SODIUM CHLORIDE 0.9 % IV SOLN
Freq: Once | INTRAVENOUS | Status: AC
  Administered 2014-12-25: 13:00:00 via INTRAVENOUS

## 2014-12-25 MED ORDER — SODIUM CHLORIDE 0.9 % IV SOLN
Freq: Once | INTRAVENOUS | Status: AC
  Administered 2014-12-25: 13:00:00 via INTRAVENOUS
  Filled 2014-12-25: qty 8

## 2014-12-25 MED ORDER — SODIUM CHLORIDE 0.9 % IV SOLN
331.0000 mg | Freq: Once | INTRAVENOUS | Status: AC
  Administered 2014-12-25: 330 mg via INTRAVENOUS
  Filled 2014-12-25: qty 33

## 2014-12-25 MED ORDER — SODIUM CHLORIDE 0.9 % IV SOLN
500.0000 mg/m2 | Freq: Once | INTRAVENOUS | Status: AC
  Administered 2014-12-25: 875 mg via INTRAVENOUS
  Filled 2014-12-25: qty 35

## 2014-12-25 NOTE — Patient Instructions (Signed)
Pemetrexed injection What is this medicine? PEMETREXED (PEM e TREX ed) is a chemotherapy drug. This medicine affects cells that are rapidly growing, such as cancer cells and cells in your mouth and stomach. It is usually used to treat lung cancers like non-small cell lung cancer and mesothelioma. It may also be used to treat other cancers. This medicine may be used for other purposes; ask your health care provider or pharmacist if you have questions. COMMON BRAND NAME(S): Alimta What should I tell my health care provider before I take this medicine? They need to know if you have any of these conditions: -if you frequently drink alcohol containing beverages -infection (especially a virus infection such as chickenpox, cold sores, or herpes) -kidney disease -liver disease -low blood counts, like low platelets, red bloods, or white blood cells -an unusual or allergic reaction to pemetrexed, mannitol, other medicines, foods, dyes, or preservatives -pregnant or trying to get pregnant -breast-feeding How should I use this medicine? This drug is given as an infusion into a vein. It is administered in a hospital or clinic by a specially trained health care professional. Talk to your pediatrician regarding the use of this medicine in children. Special care may be needed. Overdosage: If you think you have taken too much of this medicine contact a poison control center or emergency room at once. NOTE: This medicine is only for you. Do not share this medicine with others. What if I miss a dose? It is important not to miss your dose. Call your doctor or health care professional if you are unable to keep an appointment. What may interact with this medicine? -aspirin and aspirin-like medicines -medicines to increase blood counts like filgrastim, pegfilgrastim, sargramostim -methotrexate -NSAIDS, medicines for pain and inflammation, like ibuprofen or naproxen -probenecid -pyrimethamine -vaccines Talk to  your doctor or health care professional before taking any of these medicines: -acetaminophen -aspirin -ibuprofen -ketoprofen -naproxen This list may not describe all possible interactions. Give your health care provider a list of all the medicines, herbs, non-prescription drugs, or dietary supplements you use. Also tell them if you smoke, drink alcohol, or use illegal drugs. Some items may interact with your medicine. What should I watch for while using this medicine? Visit your doctor for checks on your progress. This drug may make you feel generally unwell. This is not uncommon, as chemotherapy can affect healthy cells as well as cancer cells. Report any side effects. Continue your course of treatment even though you feel ill unless your doctor tells you to stop. In some cases, you may be given additional medicines to help with side effects. Follow all directions for their use. Call your doctor or health care professional for advice if you get a fever, chills or sore throat, or other symptoms of a cold or flu. Do not treat yourself. This drug decreases your body's ability to fight infections. Try to avoid being around people who are sick. This medicine may increase your risk to bruise or bleed. Call your doctor or health care professional if you notice any unusual bleeding. Be careful brushing and flossing your teeth or using a toothpick because you may get an infection or bleed more easily. If you have any dental work done, tell your dentist you are receiving this medicine. Avoid taking products that contain aspirin, acetaminophen, ibuprofen, naproxen, or ketoprofen unless instructed by your doctor. These medicines may hide a fever. Call your doctor or health care professional if you get diarrhea or mouth sores. Do not treat   yourself. To protect your kidneys, drink water or other fluids as directed while you are taking this medicine. Men and women must use effective birth control while taking this  medicine. You may also need to continue using effective birth control for a time after stopping this medicine. Do not become pregnant while taking this medicine. Tell your doctor right away if you think that you or your partner might be pregnant. There is a potential for serious side effects to an unborn child. Talk to your health care professional or pharmacist for more information. Do not breast-feed an infant while taking this medicine. This medicine may lower sperm counts. What side effects may I notice from receiving this medicine? Side effects that you should report to your doctor or health care professional as soon as possible: -allergic reactions like skin rash, itching or hives, swelling of the face, lips, or tongue -low blood counts - this medicine may decrease the number of white blood cells, red blood cells and platelets. You may be at increased risk for infections and bleeding. -signs of infection - fever or chills, cough, sore throat, pain or difficulty passing urine -signs of decreased platelets or bleeding - bruising, pinpoint red spots on the skin, black, tarry stools, blood in the urine -signs of decreased red blood cells - unusually weak or tired, fainting spells, lightheadedness -breathing problems, like a dry cough -changes in emotions or moods -chest pain -confusion -diarrhea -high blood pressure -mouth or throat sores or ulcers -pain, swelling, warmth in the leg -pain on swallowing -swelling of the ankles, feet, hands -trouble passing urine or change in the amount of urine -vomiting -yellowing of the eyes or skin Side effects that usually do not require medical attention (report to your doctor or health care professional if they continue or are bothersome): -hair loss -loss of appetite -nausea -stomach upset This list may not describe all possible side effects. Call your doctor for medical advice about side effects. You may report side effects to FDA at  1-800-FDA-1088. Where should I keep my medicine? This drug is given in a hospital or clinic and will not be stored at home. NOTE: This sheet is a summary. It may not cover all possible information. If you have questions about this medicine, talk to your doctor, pharmacist, or health care provider.  2015, [employer] Standard. (2008-01-03 13:24:03) Carboplatin injection What is this medicine? CARBOPLATIN (KAR boe pla tin) is a chemotherapy drug. It targets fast dividing cells, like cancer cells, and causes these cells to die. This medicine is used to treat ovarian cancer and many other cancers. This medicine may be used for other purposes; ask your health care provider or pharmacist if you have questions. COMMON BRAND NAME(S): Paraplatin What should I tell my health care provider before I take this medicine? They need to know if you have any of these conditions: -blood disorders -hearing problems -kidney disease -recent or ongoing radiation therapy -an unusual or allergic reaction to carboplatin, cisplatin, other chemotherapy, other medicines, foods, dyes, or preservatives -pregnant or trying to get pregnant -breast-feeding How should I use this medicine? This drug is usually given as an infusion into a vein. It is administered in a hospital or clinic by a specially trained health care professional. Talk to your pediatrician regarding the use of this medicine in children. Special care may be needed. Overdosage: If you think you have taken too much of this medicine contact a poison control center or emergency room at once. NOTE: This medicine is   only for you. Do not share this medicine with others. What if I miss a dose? It is important not to miss a dose. Call your doctor or health care professional if you are unable to keep an appointment. What may interact with this medicine? -medicines for seizures -medicines to increase blood counts like filgrastim, pegfilgrastim,  sargramostim -some antibiotics like amikacin, gentamicin, neomycin, streptomycin, tobramycin -vaccines Talk to your doctor or health care professional before taking any of these medicines: -acetaminophen -aspirin -ibuprofen -ketoprofen -naproxen This list may not describe all possible interactions. Give your health care provider a list of all the medicines, herbs, non-prescription drugs, or dietary supplements you use. Also tell them if you smoke, drink alcohol, or use illegal drugs. Some items may interact with your medicine. What should I watch for while using this medicine? Your condition will be monitored carefully while you are receiving this medicine. You will need important blood work done while you are taking this medicine. This drug may make you feel generally unwell. This is not uncommon, as chemotherapy can affect healthy cells as well as cancer cells. Report any side effects. Continue your course of treatment even though you feel ill unless your doctor tells you to stop. In some cases, you may be given additional medicines to help with side effects. Follow all directions for their use. Call your doctor or health care professional for advice if you get a fever, chills or sore throat, or other symptoms of a cold or flu. Do not treat yourself. This drug decreases your body's ability to fight infections. Try to avoid being around people who are sick. This medicine may increase your risk to bruise or bleed. Call your doctor or health care professional if you notice any unusual bleeding. Be careful brushing and flossing your teeth or using a toothpick because you may get an infection or bleed more easily. If you have any dental work done, tell your dentist you are receiving this medicine. Avoid taking products that contain aspirin, acetaminophen, ibuprofen, naproxen, or ketoprofen unless instructed by your doctor. These medicines may hide a fever. Do not become pregnant while taking this  medicine. Women should inform their doctor if they wish to become pregnant or think they might be pregnant. There is a potential for serious side effects to an unborn child. Talk to your health care professional or pharmacist for more information. Do not breast-feed an infant while taking this medicine. What side effects may I notice from receiving this medicine? Side effects that you should report to your doctor or health care professional as soon as possible: -allergic reactions like skin rash, itching or hives, swelling of the face, lips, or tongue -signs of infection - fever or chills, cough, sore throat, pain or difficulty passing urine -signs of decreased platelets or bleeding - bruising, pinpoint red spots on the skin, black, tarry stools, nosebleeds -signs of decreased red blood cells - unusually weak or tired, fainting spells, lightheadedness -breathing problems -changes in hearing -changes in vision -chest pain -high blood pressure -low blood counts - This drug may decrease the number of white blood cells, red blood cells and platelets. You may be at increased risk for infections and bleeding. -nausea and vomiting -pain, swelling, redness or irritation at the injection site -pain, tingling, numbness in the hands or feet -problems with balance, talking, walking -trouble passing urine or change in the amount of urine Side effects that usually do not require medical attention (report to your doctor or health care professional   if they continue or are bothersome): -hair loss -loss of appetite -metallic taste in the mouth or changes in taste This list may not describe all possible side effects. Call your doctor for medical advice about side effects. You may report side effects to FDA at 1-800-FDA-1088. Where should I keep my medicine? This drug is given in a hospital or clinic and will not be stored at home. NOTE: This sheet is a summary. It may not cover all possible information. If you  have questions about this medicine, talk to your doctor, pharmacist, or health care provider.  2015, [employer] Standard. (2007-09-06 14:38:05)  

## 2014-12-25 NOTE — Progress Notes (Signed)
[name] [phone]   [phone]  OFFICE PROGRESS NOTE  GREEN, [name], MD [address] [place] [address]  DIAGNOSIS: Stage IIIA (T3, N1, M0) non-small cell lung cancer, adenocarcinoma with negative EGFR mutation and negative for gene translocation diagnosed in May 2016 in a patient with remote history of smoking. She has multiple satellite nodules that would place her at a higher stage.  [facility] Molecular study: positive for BRCA2, NOTCH1, RAF1 and NF![name]  PRIOR THERAPY: None  CURRENT THERAPY: Systemic chemotherapy with carboplatin for an AUC of 5 and Alimta at 500 mg/m given every 3 weeks. Status post 1 cycle  INTERVAL HISTORY: [name] Carlson 81 y.o. female returns to the clinic today for follow-up visit accompanied by her daughter. She completed her first cycle of systemic chemotherapy with carboplatin and Alimta. She presents today prior to cycle 2. Overall she tolerated first cycle of chemotherapy relatively well with the exception of some fatigue and constipation that developed about 3 days after her chemotherapy. After taking stool softeners and MiraLAX she developed some loose stools which were controlled without the use of any Imodium. She reports some dizziness today but has a long-standing history of vertigo. She denied any issues with nausea vomiting or diarrhea. Her appetite remains good she is sleeping well. She continues to have her baseline shortness of breath with exertion. She denied cough, chest pain or hemoptysis The patient denied having any significant weight loss or night sweats. She has no fever or chills. She has no significant nausea or vomiting. She had recent molecular studies performed by [facility] and it showed no positive EGFR mutation or ALK gene translocation.   MEDICAL HISTORY: Past Medical History  Diagnosis Date  . Hypertension   . External carotid artery stenosis     L side dx'd  with doppler 2012  . UTI (lower urinary tract infection)   . Pneumonia   . Thyroid disease     ALLERGIES:  is allergic to corticosteroids and minocycline.  MEDICATIONS:  Current Outpatient Prescriptions  Medication Sig Dispense Refill  . aspirin 81 MG tablet Take 81 mg by mouth daily.    [name] dexamethasone (DECADRON) 4 MG tablet 4 mg po bid the day before, day of and day after chemotherapy 40 tablet 1  . doxazosin (CARDURA) 4 MG tablet Take 4 mg by mouth daily.     . folic acid (FOLVITE) 1 MG tablet Take 1 tablet (1 mg total) by mouth daily. 30 tablet 4  . levothyroxine (SYNTHROID, LEVOTHROID) 50 MCG tablet Take 50 mcg by mouth daily before breakfast.    . losartan (COZAAR) 100 MG tablet Take 100 mg by mouth daily.    [name] OVER THE COUNTER MEDICATION     . prochlorperazine (COMPAZINE) 10 MG tablet Take 1 tablet (10 mg total) by mouth every 6 (six) hours as needed for nausea or vomiting. 30 tablet 0  . verapamil (VERELAN PM) 240 MG 24 hr capsule Take 240 mg by mouth at bedtime.     No current facility-administered medications for this visit.   Facility-Administered Medications Ordered in Other Visits  Medication Dose Route Frequency Provider Last Rate Last Dose  . CARBOplatin (PARAPLATIN) 330 mg in sodium chloride 0.9 % 100 mL chemo infusion  330 mg Intravenous Once Curt Bears, MD      . ondansetron (ZOFRAN) 16 mg, dexamethasone (DECADRON) 20 mg in sodium chloride 0.9 % 50 mL IVPB  Intravenous Once Curt Bears, MD      . PEMEtrexed (ALIMTA) 875 mg in sodium chloride 0.9 % 100 mL chemo infusion  500 mg/m2 (Treatment Plan Actual) Intravenous Once Curt Bears, MD        SURGICAL HISTORY:  Past Surgical History  Procedure Laterality Date  . Video bronchoscopy Bilateral 04/06/2014    Procedure: VIDEO BRONCHOSCOPY WITH FLUORO;  Surgeon: Juanito Doom, MD;  Location: [facility];  Service: Cardiopulmonary;  Laterality: Bilateral;  . Eye surgery      REVIEW OF SYSTEMS:   Constitutional: positive for fatigue Eyes: negative Ears, nose, mouth, throat, and face: negative Respiratory: positive for dyspnea on exertion Cardiovascular: negative Gastrointestinal: positive for constipation Genitourinary:negative Integument/breast: negative Hematologic/lymphatic: negative Musculoskeletal:negative Neurological: negative Behavioral/Psych: negative Endocrine: negative Allergic/Immunologic: negative   PHYSICAL EXAMINATION: General appearance: alert, cooperative, fatigued and no distress Head: Normocephalic, without obvious abnormality, atraumatic Neck: no adenopathy, no JVD, supple, symmetrical, trachea midline and thyroid not enlarged, symmetric, no tenderness/mass/nodules Lymph nodes: Cervical, supraclavicular, and axillary nodes normal. Resp: rales RLL Back: symmetric, no curvature. ROM normal. No CVA tenderness. Cardio: regular rate and rhythm, S1, S2 normal, no murmur, click, rub or gallop GI: soft, non-tender; bowel sounds normal; no masses,  no organomegaly Extremities: extremities normal, atraumatic, no cyanosis or edema Neurologic: Alert and oriented X 3, normal strength and tone. Normal symmetric reflexes. Normal coordination and gait  ECOG PERFORMANCE STATUS: 1 - Symptomatic but completely ambulatory  Blood pressure 140/49, pulse 68, temperature 98.4 F (36.9 C), temperature source Oral, resp. rate 18, height '5\' 3"'  (1.6 m), weight 150 lb 6.4 oz (68.221 kg), SpO2 98 %.  LABORATORY DATA: Lab Results  Component Value Date   WBC 5.8 12/25/2014   HGB 10.7* 12/25/2014   HCT 31.3* 12/25/2014   MCV 93.6 12/25/2014   PLT 337 12/25/2014      Chemistry      Component Value Date/Time   NA 132* 12/25/2014 1135   NA 133* 09/10/2014 1439   K 4.3 12/25/2014 1135   K 3.9 09/10/2014 1439   CL 99 09/10/2014 1439   CO2 19* 12/25/2014 1135   CO2 23 09/10/2014 1439   BUN 21.2 12/25/2014 1135   BUN 13 09/10/2014 1439   CREATININE 0.8 12/25/2014 1135    CREATININE 0.79 09/10/2014 1439      Component Value Date/Time   CALCIUM 9.8 12/25/2014 1135   CALCIUM 9.2 09/10/2014 1439   ALKPHOS 105 12/25/2014 1135   ALKPHOS 91 09/04/2014 1924   AST 29 12/25/2014 1135   AST 17 09/04/2014 1924   ALT 32 12/25/2014 1135   ALT 11 09/04/2014 1924   BILITOT 0.39 12/25/2014 1135   BILITOT 0.9 09/04/2014 1924       RADIOGRAPHIC STUDIES: No results found.  ASSESSMENT AND PLAN: This is a very pleasant 81 year old white female with stage IIIA/B non-small cell lung cancer, adenocarcinoma involving the right lower lobe with hilar lymphadenopathy as well as several satellite lesions. The molecular study showed no evidence for target mutations. She is currently being treated with systemic chemotherapy with carboplatin for AUC of 5 and Alimta 500 MG/M2 every 3 weeks. Status post 1 cycle. Overall she tolerated her first cycle systemic chemotherapy relatively well with the exception of some fatigue and constipation which switched to loose stools after the use of stool softeners and MiraLAX.   The patient was seen and discussed with Dr. Julien Nordmann. Recommend that she proceed with cycle 2 of her chemotherapy today as scheduled.  She will continue with weekly labs and will have her return visit in 3 weeks prior to cycle #3.   She was advised to call immediately if she has any concerning symptoms in the interval. The patient voices understanding of current disease status and treatment options and is in agreement with the current care plan.  All questions were answered. The patient knows to call the clinic with any problems, questions or concerns. We can certainly see the patient much sooner if necessary.  Mikey Bussing, DNP, AGPNP-BC, AOCNP 12/25/2014  ADDENDUM: Hematology/Oncology Attending: I had a face to face encounter with the patient. I recommended her care plan. This is a very pleasant 81 years old white female with a stage IIIa non-small cell lung cancer  currently undergoing systemic chemotherapy with carboplatin and Alimta is status post 1 cycle. She tolerated the first cycle of her treatment fairly well with no significant adverse effects except for mild fatigue. The patient is here to start cycle #2. We will proceed with the treatment as scheduled. She will come back for follow-up visit in 3 weeks for reevaluation before starting cycle #3.  The patient was advised to call immediately if she has any concerning symptoms in the interval.  Disclaimer: This note was dictated with voice recognition software. Similar sounding words can inadvertently be transcribed and may be missed upon review. Eilleen Kempf., MD 12/27/2014

## 2015-01-01 ENCOUNTER — Encounter: Payer: Self-pay | Admitting: Nurse Practitioner

## 2015-01-01 ENCOUNTER — Ambulatory Visit (HOSPITAL_BASED_OUTPATIENT_CLINIC_OR_DEPARTMENT_OTHER): Payer: Medicare Other | Admitting: Nurse Practitioner

## 2015-01-01 ENCOUNTER — Other Ambulatory Visit (HOSPITAL_BASED_OUTPATIENT_CLINIC_OR_DEPARTMENT_OTHER): Payer: Medicare Other

## 2015-01-01 ENCOUNTER — Other Ambulatory Visit: Payer: Self-pay

## 2015-01-01 VITALS — BP 159/50 | HR 61 | Temp 97.9°F | Resp 18 | Wt 151.6 lb

## 2015-01-01 DIAGNOSIS — M545 Low back pain, unspecified: Secondary | ICD-10-CM | POA: Insufficient documentation

## 2015-01-01 DIAGNOSIS — C3491 Malignant neoplasm of unspecified part of right bronchus or lung: Secondary | ICD-10-CM

## 2015-01-01 DIAGNOSIS — R300 Dysuria: Secondary | ICD-10-CM

## 2015-01-01 DIAGNOSIS — N390 Urinary tract infection, site not specified: Secondary | ICD-10-CM | POA: Diagnosis not present

## 2015-01-01 DIAGNOSIS — R319 Hematuria, unspecified: Principal | ICD-10-CM

## 2015-01-01 DIAGNOSIS — R1084 Generalized abdominal pain: Secondary | ICD-10-CM | POA: Diagnosis not present

## 2015-01-01 DIAGNOSIS — E860 Dehydration: Secondary | ICD-10-CM | POA: Diagnosis not present

## 2015-01-01 DIAGNOSIS — C3431 Malignant neoplasm of lower lobe, right bronchus or lung: Secondary | ICD-10-CM | POA: Diagnosis not present

## 2015-01-01 LAB — URINALYSIS, MICROSCOPIC - CHCC
Bilirubin (Urine): NEGATIVE
Glucose: NEGATIVE mg/dL
Ketones: NEGATIVE mg/dL
Nitrite: POSITIVE
Protein: NEGATIVE mg/dL
Specific Gravity, Urine: 1.01 (ref 1.003–1.035)
Urobilinogen, UR: 0.2 mg/dL (ref 0.2–1)
pH: 6 (ref 4.6–8.0)

## 2015-01-01 LAB — COMPREHENSIVE METABOLIC PANEL (CC13)
ALBUMIN: 3.2 g/dL — AB (ref 3.5–5.0)
ALT: 28 U/L (ref 0–55)
ANION GAP: 10 meq/L (ref 3–11)
AST: 18 U/L (ref 5–34)
Alkaline Phosphatase: 80 U/L (ref 40–150)
BILIRUBIN TOTAL: 0.87 mg/dL (ref 0.20–1.20)
BUN: 20.6 mg/dL (ref 7.0–26.0)
CALCIUM: 9.2 mg/dL (ref 8.4–10.4)
CO2: 21 meq/L — AB (ref 22–29)
Chloride: 102 mEq/L (ref 98–109)
Creatinine: 0.8 mg/dL (ref 0.6–1.1)
EGFR: 67 mL/min/{1.73_m2} — ABNORMAL LOW (ref >90)
Glucose: 112 mg/dl (ref 70–140)
POTASSIUM: 4.1 meq/L (ref 3.5–5.1)
Sodium: 133 mEq/L — ABNORMAL LOW (ref 136–145)
Total Protein: 6.1 g/dL — ABNORMAL LOW (ref 6.4–8.3)

## 2015-01-01 LAB — CBC WITH DIFFERENTIAL/PLATELET
BASO%: 0.2 % (ref 0.0–2.0)
BASOS ABS: 0 10*3/uL (ref 0.0–0.1)
EOS%: 4.7 % (ref 0.0–7.0)
Eosinophils Absolute: 0.2 10*3/uL (ref 0.0–0.5)
HEMATOCRIT: 31.2 % — AB (ref 34.8–46.6)
HGB: 10.6 g/dL — ABNORMAL LOW (ref 11.6–15.9)
LYMPH%: 21 % (ref 14.0–49.7)
MCH: 32 pg (ref 25.1–34.0)
MCHC: 33.9 g/dL (ref 31.5–36.0)
MCV: 94.5 fL (ref 79.5–101.0)
MONO#: 0.2 10*3/uL (ref 0.1–0.9)
MONO%: 5 % (ref 0.0–14.0)
NEUT%: 69.1 % (ref 38.4–76.8)
NEUTROS ABS: 2.3 10*3/uL (ref 1.5–6.5)
Platelets: 196 10*3/uL (ref 145–400)
RBC: 3.3 10*6/uL — AB (ref 3.70–5.45)
RDW: 14.9 % — ABNORMAL HIGH (ref 11.2–14.5)
WBC: 3.4 10*3/uL — AB (ref 3.9–10.3)
lymph#: 0.7 10*3/uL — ABNORMAL LOW (ref 0.9–3.3)

## 2015-01-01 MED ORDER — CIPROFLOXACIN HCL 500 MG PO TABS: 500.0000 mg | ORAL_TABLET | Freq: Two times a day (BID) | ORAL | Status: DC

## 2015-01-01 MED ORDER — MORPHINE SULFATE 4 MG/ML IJ SOLN
2.0000 mg | Freq: Once | INTRAMUSCULAR | Status: AC
  Administered 2015-01-01: 2 mg via INTRAVENOUS

## 2015-01-01 MED ORDER — HYDROCODONE-ACETAMINOPHEN 5-325 MG PO TABS: 1.0000 | ORAL_TABLET | Freq: Four times a day (QID) | ORAL | Status: DC | PRN

## 2015-01-01 MED ORDER — MORPHINE SULFATE 4 MG/ML IJ SOLN
INTRAMUSCULAR | Status: AC
  Filled 2015-01-01: qty 1

## 2015-01-01 MED ORDER — SODIUM CHLORIDE 0.9 % IV SOLN
1000.0000 mL | Freq: Once | INTRAVENOUS | Status: AC
  Administered 2015-01-01: 1000 mL via INTRAVENOUS

## 2015-01-01 NOTE — Progress Notes (Signed)
[facility]NA[facility]EMENT [facility]   HPI: [name] Carlson 81 y.o. female diagnosed with lung cancer.  Currently undergoing carboplatin/Alimta chemotherapy regimen.  Patient is complaining of new onset first thing this morning of severe/sharp pain to her right flank area.  She denies any dysuria, frequency, hematuria, or fever/chills.  She denies any known injury or trauma to that region.  HPI  ROS  Past Medical History  Diagnosis Date  . Hypertension   . External carotid artery stenosis     L side dx'd with doppler 2012  . UTI (lower urinary tract infection)   . Pneumonia   . Thyroid disease     Past Surgical History  Procedure Laterality Date  . Video bronchoscopy Bilateral 04/06/2014    Procedure: VIDEO BRONCHOSCOPY WITH FLUORO;  Surgeon: Juanito Doom, MD;  Location: [facility];  Service: Cardiopulmonary;  Laterality: Bilateral;  . Eye surgery      has Abnormal chest CT; Cough; Fatigue; Non-small cell cancer of right lung; Lower back pain; Dysuria; UTI (urinary tract infection); and Dehydration on her problem list.    is allergic to corticosteroids and minocycline.    Medication List       This list is accurate as of: 01/01/15  5:45 PM.  Always use your most recent med list.               aspirin 81 MG tablet  Take 81 mg by mouth daily.     ciprofloxacin 500 MG tablet  Commonly known as:  CIPRO  Take 1 tablet (500 mg total) by mouth 2 (two) times daily.     dexamethasone 4 MG tablet  Commonly known as:  DECADRON  4 mg po bid the day before, day of and day after chemotherapy     doxazosin 4 MG tablet  Commonly known as:  CARDURA  Take 4 mg by mouth daily.     folic acid 1 MG tablet  Commonly known as:  FOLVITE  Take 1 tablet (1 mg total) by mouth daily.     HYDROcodone-acetaminophen 5-325 MG per tablet  Commonly known as:  NORCO/VICODIN  Take 1 tablet by mouth every 6 (six) hours as needed for moderate pain.     levothyroxine 50 MCG tablet  Commonly  known as:  SYNTHROID, LEVOTHROID  Take 50 mcg by mouth daily before breakfast.     losartan 100 MG tablet  Commonly known as:  COZAAR  Take 100 mg by mouth daily.     OVER THE COUNTER MEDICATION     prochlorperazine 10 MG tablet  Commonly known as:  COMPAZINE  Take 1 tablet (10 mg total) by mouth every 6 (six) hours as needed for nausea or vomiting.     verapamil 240 MG 24 hr capsule  Commonly known as:  VERELAN PM  Take 240 mg by mouth at bedtime.         PHYSICAL EXAMINATION  Oncology Vitals 01/01/2015 12/25/2014 12/11/2014 12/04/2014 12/04/2014 12/04/2014 12/04/2014  Height - 160 cm 160 cm - - - -  Weight 68.765 kg 68.221 kg 68.266 kg - - - -  Weight (lbs) 151 lbs 10 oz 150 lbs 6 oz 150 lbs 8 oz - - - -  BMI (kg/m2) - 26.64 kg/m2 26.66 kg/m2 - - - -  Temp 97.9 98.4 97.9 97.5 - - 96.8  Pulse 61 68 60 47 53 51 -  Resp _0 SpO2 95 98 96 - - - -  BSA (m2) - 1.74 m2 1.74 m2 - - - -   BP Readings from Last 3 Encounters:  01/01/15 159/50  12/25/14 140/49  12/11/14 128/53    Physical Exam  Constitutional: She is oriented to person, place, and time and well-developed, well-nourished, and in no distress.  HENT:  Head: Normocephalic and atraumatic.  Mouth/Throat: Oropharynx is clear and moist.  Eyes: EOM are normal. Pupils are equal, round, and reactive to light. Right eye exhibits no discharge. Left eye exhibits no discharge. No scleral icterus.  Neck: Normal range of motion. Neck supple. No JVD present. No tracheal deviation present. No thyromegaly present.  Cardiovascular: Normal rate, regular rhythm, normal heart sounds and intact distal pulses.   Pulmonary/Chest: Effort normal and breath sounds normal. No respiratory distress. She has no wheezes. She has no rales. She exhibits no tenderness.  Abdominal: Soft. Bowel sounds are normal. She exhibits no distension and no mass. There is no tenderness. There is no rebound and no guarding.  Right flank tender with  palpation.  Musculoskeletal: Normal range of motion. She exhibits no edema or tenderness.  Lymphadenopathy:    She has no cervical adenopathy.  Neurological: She is alert and oriented to person, place, and time.  Skin: Skin is warm and dry. No rash noted. No erythema. No pallor.  Psychiatric: Affect normal.  Nursing note and vitals reviewed.   LABORATORY DATA:. Appointment on 01/01/2015  Component Date Value Ref Range Status  . WBC 01/01/2015 3.4* 3.9 - 10.3 10e3/uL Final  . NEUT# 01/01/2015 2.3  1.5 - 6.5 10e3/uL Final  . HGB 01/01/2015 10.6* 11.6 - 15.9 g/dL Final  . HCT 01/01/2015 31.2* 34.8 - 46.6 % Final  . Platelets 01/01/2015 196  145 - 400 10e3/uL Final  . MCV 01/01/2015 94.5  79.5 - 101.0 fL Final  . MCH 01/01/2015 32.0  25.1 - 34.0 pg Final  . MCHC 01/01/2015 33.9  31.5 - 36.0 g/dL Final  . RBC 01/01/2015 3.30* 3.70 - 5.45 10e6/uL Final  . RDW 01/01/2015 14.9* 11.2 - 14.5 % Final  . lymph# 01/01/2015 0.7* 0.9 - 3.3 10e3/uL Final  . MONO# 01/01/2015 0.2  0.1 - 0.9 10e3/uL Final  . Eosinophils Absolute 01/01/2015 0.2  0.0 - 0.5 10e3/uL Final  . Basophils Absolute 01/01/2015 0.0  0.0 - 0.1 10e3/uL Final  . NEUT% 01/01/2015 69.1  38.4 - 76.8 % Final  . LYMPH% 01/01/2015 21.0  14.0 - 49.7 % Final  . MONO% 01/01/2015 5.0  0.0 - 14.0 % Final  . EOS% 01/01/2015 4.7  0.0 - 7.0 % Final  . BASO% 01/01/2015 0.2  0.0 - 2.0 % Final  . Sodium 01/01/2015 133* 136 - 145 mEq/L Final  . Potassium 01/01/2015 4.1  3.5 - 5.1 mEq/L Final  . Chloride 01/01/2015 102  98 - 109 mEq/L Final  . CO2 01/01/2015 21* 22 - 29 mEq/L Final  . Glucose 01/01/2015 112  70 - 140 mg/dl Final  . BUN 01/01/2015 20.6  7.0 - 26.0 mg/dL Final  . Creatinine 01/01/2015 0.8  0.6 - 1.1 mg/dL Final  . Total Bilirubin 01/01/2015 0.87  0.20 - 1.20 mg/dL Final  . Alkaline Phosphatase 01/01/2015 80  40 - 150 U/L Final  . AST 01/01/2015 18  5 - 34 U/L Final  . ALT 01/01/2015 28  0 - 55 U/L Final  . Total Protein  01/01/2015 6.1* 6.4 - 8.3 g/dL Final  . Albumin 01/01/2015 3.2* 3.5 - 5.0 g/dL Final  . Calcium  01/01/2015 9.2  8.4 - 10.4 mg/dL Final  . Anion Gap 01/01/2015 10  3 - 11 mEq/L Final  . EGFR 01/01/2015 67* >90 ml/min/1.73 m2 Final   eGFR is calculated using the CKD-EPI Creatinine Equation (2009)  . Glucose 01/01/2015 Negative  Negative mg/dL Final  . Bilirubin (Urine) 01/01/2015 Negative  Negative Final  . Ketones 01/01/2015 Negative  Negative mg/dL Final  . Specific Gravity, Urine 01/01/2015 1.010  1.003 - 1.035 Final  . Blood 01/01/2015 Small  Negative Final  . pH 01/01/2015 6.0  4.6 - 8.0 Final  . Protein 01/01/2015 Negative  Negative- <30 mg/dL Final  . Urobilinogen, UR 01/01/2015 0.2  0.2 - 1 mg/dL Final  . Nitrite 01/01/2015 Positive  Negative Final  . Leukocyte Esterase 01/01/2015 Large  Negative Final  . RBC / HPF 01/01/2015 3-6  0 - 2 Final  . WBC, UA 01/01/2015 TNTC  0 - 2 Final  . Bacteria, UA 01/01/2015 Moderate  Negative- Trace Final  . Epithelial Cells 01/01/2015 Few  Negative- Few Final     RADIOGRAPHIC STUDIES: No results found.  ASSESSMENT/PLAN:    Non-small cell cancer of right lung Patient received her last cycle of carboplatin/Alimta chemotherapy on 12/25/2014.  Patient is scheduled to return for labs only on 01/08/2015.  She is scheduled for labs, follow up visit, and her next chemotherapy on 01/15/2015.  UTI (urinary tract infection) Patient is complaining of new onset first thing this morning of severe/sharp pain to her right flank area.  She denies any dysuria, frequency, hematuria, or fever/chills.  She denies any known injury or trauma to that region.  On exam.-Patient with no abdominal tenderness whatsoever.  She does however, have significant tenderness to the right flank region.  Labs obtained today did reveal a urinary tract infection with small amount hematuria, nitrite positive, large amount leukocytes, WBCs too numerous to count, and moderate  bacteria.  Urine culture pending results.  Vital signs were stable today; and patient was afebrile.  Patient was given IV fluid rehydration while at the [facility] today.  She was also given morphine 2 mg IV; which greatly improved patient's right flank pain.  Patient appears nontoxic today.  Will prescribe Cipro anabiotic for patient's urinary tract infection symptoms.  Patient also requested and was given a prescription for Vicodin to take on an as-needed basis.  Advised patient that pain medication mainly patient drowsy.  Dehydration Patient appears mildly dehydrated today; was also noted to be both hyponatremic and hypochloremic.  Patient received 1 L normal saline IV fluid rehydration while at the cancer Center today.   Patient stated understanding of all instructions; and was in agreement with this plan of care. The patient knows to call the clinic with any problems, questions or concerns.   This was a shared visit with Dr. Julien Nordmann today.  Total time spent with patient was 40 minutes;  with greater than 75 percent of that time spent in face to face counseling regarding patient's symptoms,  and coordination of care and follow up.  Disclaimer: This note was dictated with voice recognition software. Similar sounding words can inadvertently be transcribed and may not be corrected upon review.   Drue Second, NP 01/01/2015   ADDENDUM: Hematology/Oncology Attending: I had a face to face encounter with the patient. I recommended her care plan. This is a very pleasant 81 years old white female with metastatic non-small cell lung cancer who is currently undergoing treatment with systemic chemotherapy was carboplatin and Alimta. She is  tolerating her treatment well with no specific complaints. She presented today for evaluation of right flank pain but no significant dysuria or hematuria. Urinalysis performed in the clinic today showed urinary tract infection. We will start the patient on  treatment with Cipro 500 mg by mouth twice a day for 7 days. The patient was also given IV hydration as well as morphine 2 mg IV for pain management. She felt better before leaving the office today. She would come back for follow-up visit as previously scheduled. The patient was advised to call immediately if she has any concerning symptoms in the interval.  Disclaimer: This note was dictated with voice recognition software. Similar sounding words can inadvertently be transcribed and may be missed upon review. Eilleen Kempf., MD 01/06/2015

## 2015-01-01 NOTE — Assessment & Plan Note (Addendum)
Patient received her last cycle of carboplatin/Alimta chemotherapy on 12/25/2014.  Patient is scheduled to return for labs only on 01/08/2015.  She is scheduled for labs, follow up visit, and her next chemotherapy on 01/15/2015.

## 2015-01-01 NOTE — Assessment & Plan Note (Signed)
Patient is complaining of new onset first thing this morning of severe/sharp pain to her right flank area.  She denies any dysuria, frequency, hematuria, or fever/chills.  She denies any known injury or trauma to that region.  On exam.-Patient with no abdominal tenderness whatsoever.  She does however, have significant tenderness to the right flank region.  Labs obtained today did reveal a urinary tract infection with small amount hematuria, nitrite positive, large amount leukocytes, WBCs too numerous to count, and moderate bacteria.  Urine culture pending results.  Vital signs were stable today; and patient was afebrile.  Patient was given IV fluid rehydration while at the [facility] today.  She was also given morphine 2 mg IV; which greatly improved patient's right flank pain.  Patient appears nontoxic today.  Will prescribe Cipro anabiotic for patient's urinary tract infection symptoms.  Patient also requested and was given a prescription for Vicodin to take on an as-needed basis.  Advised patient that pain medication mainly patient drowsy.

## 2015-01-01 NOTE — Patient Instructions (Signed)
Dehydration, Adult Dehydration is when you lose more fluids from the body than you take in. Vital organs like the kidneys, brain, and heart cannot function without a proper amount of fluids and salt. Any loss of fluids from the body can cause dehydration.  CAUSES   Vomiting.  Diarrhea.  Excessive sweating.  Excessive urine output.  Fever. SYMPTOMS  Mild dehydration  Thirst.  Dry lips.  Slightly dry mouth. Moderate dehydration  Very dry mouth.  Sunken eyes.  Skin does not bounce back quickly when lightly pinched and released.  Dark urine and decreased urine production.  Decreased tear production.  Headache. Severe dehydration  Very dry mouth.  Extreme thirst.  Rapid, weak pulse (more than 100 beats per minute at rest).  Cold hands and feet.  Not able to sweat in spite of heat and temperature.  Rapid breathing.  Blue lips.  Confusion and lethargy.  Difficulty being awakened.  Minimal urine production.  No tears. DIAGNOSIS  Your caregiver will diagnose dehydration based on your symptoms and your exam. Blood and urine tests will help confirm the diagnosis. The diagnostic evaluation should also identify the cause of dehydration. TREATMENT  Treatment of mild or moderate dehydration can often be done at home by increasing the amount of fluids that you drink. It is best to drink small amounts of fluid more often. Drinking too much at one time can make vomiting worse. Refer to the home care instructions below. Severe dehydration needs to be treated at the hospital where you will probably be given intravenous (IV) fluids that contain water and electrolytes. HOME CARE INSTRUCTIONS   Ask your caregiver about specific rehydration instructions.  Drink enough fluids to keep your urine clear or pale yellow.  Drink small amounts frequently if you have nausea and vomiting.  Eat as you normally do.  Avoid:  Foods or drinks high in sugar.  Carbonated  drinks.  Juice.  Extremely hot or cold fluids.  Drinks with caffeine.  Fatty, greasy foods.  Alcohol.  Tobacco.  Overeating.  Gelatin desserts.  Wash your hands well to avoid spreading bacteria and viruses.  Only take over-the-counter or prescription medicines for pain, discomfort, or fever as directed by your caregiver.  Ask your caregiver if you should continue all prescribed and over-the-counter medicines.  Keep all follow-up appointments with your caregiver. SEEK MEDICAL CARE IF:  You have abdominal pain and it increases or stays in one area (localizes).  You have a rash, stiff neck, or severe headache.  You are irritable, sleepy, or difficult to awaken.  You are weak, dizzy, or extremely thirsty. SEEK IMMEDIATE MEDICAL CARE IF:   You are unable to keep fluids down or you get worse despite treatment.  You have frequent episodes of vomiting or diarrhea.  You have blood or green matter (bile) in your vomit.  You have blood in your stool or your stool looks black and tarry.  You have not urinated in 6 to 8 hours, or you have only urinated a small amount of very dark urine.  You have a fever.  You faint. MAKE SURE YOU:   Understand these instructions.  Will watch your condition.  Will get help right away if you are not doing well or get worse. Document Released: 06/01/2005 Document Revised: 08/24/2011 Document Reviewed: 01/19/2011 [employer] Patient Information 2015 [employer]. This information is not intended to replace advice given to you by your health care provider. Make sure you discuss any questions you have with your health care   provider.  

## 2015-01-01 NOTE — Assessment & Plan Note (Signed)
Patient appears mildly dehydrated today; was also noted to be both hyponatremic and hypochloremic.  Patient received 1 L normal saline IV fluid rehydration while at the cancer Center today.

## 2015-01-02 ENCOUNTER — Telehealth: Payer: Self-pay

## 2015-01-02 LAB — URINE CULTURE

## 2015-01-02 NOTE — Telephone Encounter (Signed)
Called to follow up with pt after [facility] visit 01/01/15. Pt states she doesn't have any pain today and has not had to use her pain medication that was prescribed yesterday. States she started her abx last night and also took one this morning. Pt also states she is trying to drink plenty of fluids today. Informed pt to call if she has any problems, questions or concerns. Pt verbalized understanding and is aware of next appt.

## 2015-01-08 ENCOUNTER — Telehealth: Payer: Self-pay | Admitting: Medical Oncology

## 2015-01-08 ENCOUNTER — Other Ambulatory Visit: Payer: Self-pay | Admitting: *Deleted

## 2015-01-08 ENCOUNTER — Other Ambulatory Visit (HOSPITAL_BASED_OUTPATIENT_CLINIC_OR_DEPARTMENT_OTHER): Payer: Medicare Other

## 2015-01-08 ENCOUNTER — Emergency Department (HOSPITAL_COMMUNITY)
Admission: EM | Admit: 2015-01-08 | Discharge: 2015-01-09 | Disposition: A | Discharge status: Home / Self Care | Payer: Medicare Other | Attending: Physician Assistant | Admitting: Physician Assistant

## 2015-01-08 ENCOUNTER — Ambulatory Visit (HOSPITAL_BASED_OUTPATIENT_CLINIC_OR_DEPARTMENT_OTHER): Payer: Medicare Other

## 2015-01-08 ENCOUNTER — Other Ambulatory Visit: Payer: Self-pay | Admitting: Medical Oncology

## 2015-01-08 ENCOUNTER — Encounter (HOSPITAL_COMMUNITY): Payer: Self-pay | Admitting: Emergency Medicine

## 2015-01-08 DIAGNOSIS — E079 Disorder of thyroid, unspecified: Secondary | ICD-10-CM | POA: Diagnosis not present

## 2015-01-08 DIAGNOSIS — R7989 Other specified abnormal findings of blood chemistry: Secondary | ICD-10-CM | POA: Diagnosis not present

## 2015-01-08 DIAGNOSIS — Z859 Personal history of malignant neoplasm, unspecified: Secondary | ICD-10-CM | POA: Diagnosis not present

## 2015-01-08 DIAGNOSIS — Z79899 Other long term (current) drug therapy: Secondary | ICD-10-CM | POA: Diagnosis not present

## 2015-01-08 DIAGNOSIS — R748 Abnormal levels of other serum enzymes: Secondary | ICD-10-CM

## 2015-01-08 DIAGNOSIS — Z87891 Personal history of nicotine dependence: Secondary | ICD-10-CM | POA: Insufficient documentation

## 2015-01-08 DIAGNOSIS — R339 Retention of urine, unspecified: Secondary | ICD-10-CM | POA: Insufficient documentation

## 2015-01-08 DIAGNOSIS — I10 Essential (primary) hypertension: Secondary | ICD-10-CM | POA: Diagnosis not present

## 2015-01-08 DIAGNOSIS — C3431 Malignant neoplasm of lower lobe, right bronchus or lung: Secondary | ICD-10-CM

## 2015-01-08 DIAGNOSIS — Z7982 Long term (current) use of aspirin: Secondary | ICD-10-CM | POA: Insufficient documentation

## 2015-01-08 DIAGNOSIS — Z87440 Personal history of urinary (tract) infections: Secondary | ICD-10-CM | POA: Insufficient documentation

## 2015-01-08 DIAGNOSIS — E860 Dehydration: Secondary | ICD-10-CM

## 2015-01-08 DIAGNOSIS — Z8701 Personal history of pneumonia (recurrent): Secondary | ICD-10-CM | POA: Insufficient documentation

## 2015-01-08 DIAGNOSIS — C3491 Malignant neoplasm of unspecified part of right bronchus or lung: Secondary | ICD-10-CM

## 2015-01-08 HISTORY — DX: Malignant (primary) neoplasm, unspecified: C80.1

## 2015-01-08 LAB — URINALYSIS, ROUTINE W REFLEX MICROSCOPIC
BILIRUBIN URINE: NEGATIVE
Glucose, UA: NEGATIVE mg/dL
Hgb urine dipstick: NEGATIVE
Ketones, ur: NEGATIVE mg/dL
Leukocytes, UA: NEGATIVE
NITRITE: NEGATIVE
Protein, ur: NEGATIVE mg/dL
Specific Gravity, Urine: 1.007 (ref 1.005–1.030)
UROBILINOGEN UA: 0.2 mg/dL (ref 0.0–1.0)
pH: 6 (ref 5.0–8.0)

## 2015-01-08 LAB — CBC WITH DIFFERENTIAL/PLATELET
BASO%: 0 % (ref 0.0–2.0)
Basophils Absolute: 0 10*3/uL (ref 0.0–0.1)
Basophils Absolute: 0 10*3/uL (ref 0.0–0.1)
Basophils Relative: 0 % (ref 0–1)
EOS%: 3.9 % (ref 0.0–7.0)
Eosinophils Absolute: 0.1 10*3/uL (ref 0.0–0.5)
Eosinophils Absolute: 0.1 10*3/uL (ref 0.0–0.7)
Eosinophils Relative: 3 % (ref 0–5)
HCT: 28.3 % — ABNORMAL LOW (ref 34.8–46.6)
HCT: 26 % — ABNORMAL LOW (ref 36.0–46.0)
HEMOGLOBIN: 9.8 g/dL — AB (ref 11.6–15.9)
HEMOGLOBIN: 9 g/dL — AB (ref 12.0–15.0)
LYMPH%: 21.5 % (ref 14.0–49.7)
Lymphocytes Relative: 23 % (ref 12–46)
Lymphs Abs: 0.8 10*3/uL (ref 0.7–4.0)
MCH: 32 pg (ref 25.1–34.0)
MCH: 32.3 pg (ref 26.0–34.0)
MCHC: 34.6 g/dL (ref 31.5–36.0)
MCHC: 34.6 g/dL (ref 30.0–36.0)
MCV: 92.5 fL (ref 79.5–101.0)
MCV: 93.2 fL (ref 78.0–100.0)
MONO#: 1 10*3/uL — AB (ref 0.1–0.9)
MONO%: 31.5 % — AB (ref 0.0–14.0)
Monocytes Absolute: 1 10*3/uL (ref 0.1–1.0)
Monocytes Relative: 27 % — ABNORMAL HIGH (ref 3–12)
NEUT#: 1.3 10*3/uL — ABNORMAL LOW (ref 1.5–6.5)
NEUT%: 43.1 % (ref 38.4–76.8)
NEUTROS ABS: 1.7 10*3/uL (ref 1.7–7.7)
Neutrophils Relative %: 48 % (ref 43–77)
Platelets: 60 10*3/uL — ABNORMAL LOW (ref 145–400)
Platelets: 57 10*3/uL — ABNORMAL LOW (ref 150–400)
RBC: 3.06 10*6/uL — ABNORMAL LOW (ref 3.70–5.45)
RBC: 2.79 MIL/uL — AB (ref 3.87–5.11)
RDW: 14.1 % (ref 11.2–14.5)
RDW: 14.1 % (ref 11.5–15.5)
WBC: 3.1 10*3/uL — ABNORMAL LOW (ref 3.9–10.3)
WBC: 3.6 10*3/uL — ABNORMAL LOW (ref 4.0–10.5)
lymph#: 0.7 10*3/uL — ABNORMAL LOW (ref 0.9–3.3)

## 2015-01-08 LAB — BASIC METABOLIC PANEL
Anion gap: 11 (ref 5–15)
BUN: 26 mg/dL — ABNORMAL HIGH (ref 6–20)
CHLORIDE: 102 mmol/L (ref 101–111)
CO2: 21 mmol/L — ABNORMAL LOW (ref 22–32)
Calcium: 8.5 mg/dL — ABNORMAL LOW (ref 8.9–10.3)
Creatinine, Ser: 1.99 mg/dL — ABNORMAL HIGH (ref 0.44–1.00)
GFR calc non Af Amer: 21 mL/min — ABNORMAL LOW (ref >60)
GFR, EST AFRICAN AMERICAN: 24 mL/min — AB (ref >60)
Glucose, Bld: 112 mg/dL — ABNORMAL HIGH (ref 65–99)
Potassium: 3.6 mmol/L (ref 3.5–5.1)
SODIUM: 134 mmol/L — AB (ref 135–145)

## 2015-01-08 LAB — COMPREHENSIVE METABOLIC PANEL (CC13)
ALBUMIN: 3.4 g/dL — AB (ref 3.5–5.0)
ALT: 21 U/L (ref 0–55)
AST: 19 U/L (ref 5–34)
Alkaline Phosphatase: 94 U/L (ref 40–150)
Anion Gap: 12 mEq/L — ABNORMAL HIGH (ref 3–11)
BUN: 25.2 mg/dL (ref 7.0–26.0)
CALCIUM: 8.8 mg/dL (ref 8.4–10.4)
CHLORIDE: 105 meq/L (ref 98–109)
CO2: 21 mEq/L — ABNORMAL LOW (ref 22–29)
CREATININE: 2.2 mg/dL — AB (ref 0.6–1.1)
EGFR: 19 mL/min/{1.73_m2} — AB (ref >90)
GLUCOSE: 118 mg/dL (ref 70–140)
Potassium: 3.6 mEq/L (ref 3.5–5.1)
Sodium: 138 mEq/L (ref 136–145)
TOTAL PROTEIN: 6.4 g/dL (ref 6.4–8.3)
Total Bilirubin: 0.65 mg/dL (ref 0.20–1.20)

## 2015-01-08 MED ORDER — SODIUM CHLORIDE 0.9 % IV SOLN
Freq: Once | INTRAVENOUS | Status: DC
  Administered 2015-01-08: 13:00:00 via INTRAVENOUS

## 2015-01-08 NOTE — ED Notes (Signed)
Pt was seen by Dr last Tuesday and diagnosed with a UTI and started on Cipro  Pt took her last dose today and had a follow up appt with Dr Inda Merlin earlier today  Pt received an IV in the office  According to family pt was not forth coming with information at the dr office and once at home they found out that the pt has not been able to void effectively for the past 14 days  Pt states she does not feel the urge to void

## 2015-01-08 NOTE — Telephone Encounter (Signed)
Notified patient of appointment for IVF at 1230, pt verbalized understanding

## 2015-01-08 NOTE — Telephone Encounter (Signed)
Pt notified . Orders sent and POF.

## 2015-01-08 NOTE — ED Notes (Signed)
Pt urinated a "full amount" when she got back to her room, pt states it being the first time she has been able to do that since everything has started.

## 2015-01-08 NOTE — ED Provider Notes (Signed)
CSN: [id number]     Arrival date & time 01/08/15  2104 History   First MD Initiated Contact with Patient 01/08/15 2146     Chief Complaint  Patient presents with  . Urinary Tract Infection     (Consider location/radiation/quality/duration/timing/severity/associated sxs/prior Treatment) The history is provided by the patient and medical records.    This is an 81 year old female with history of hypertension,  Pneumonia, thyroid disease, lung cancer currently on chemotherapy , presenting to the ED for urinary retention.   Patient was seen  At [facility] last week and was started on ciprofloxacin for a UTI. She had her last dose  Of antibiotics earlier today. She went for follow-up appointment earlier today received IV fluids in office due to elevated serum creatinine. She states she continues to have problems urinating. She states she feels the urge to urinate and is only able to produce a few drops at a time. She states because of this she feels she is going frequently. She denies any dysuria or hematuria. No fever or chills.   She states she does have a pressure sensation in her lower abdomen over her bladder.  No hx of urinary retention in the past.  VSS.  Past Medical History  Diagnosis Date  . Hypertension   . External carotid artery stenosis     L side dx'd with doppler 2012  . UTI (lower urinary tract infection)   . Pneumonia   . Thyroid disease   . Cancer    Past Surgical History  Procedure Laterality Date  . Video bronchoscopy Bilateral 04/06/2014    Procedure: VIDEO BRONCHOSCOPY WITH FLUORO;  Surgeon: Juanito Doom, MD;  Location: [facility];  Service: Cardiopulmonary;  Laterality: Bilateral;  . Eye surgery     Family History  Problem Relation Age of Onset  . Emphysema Father    History  Substance Use Topics  . Smoking status: Former Smoker -- 1.00 packs/day for 12 years    Types: Cigarettes    Quit date: 06/16/1959  . Smokeless tobacco: Never Used  . Alcohol  Use: No   OB History    No data available     Review of Systems  Genitourinary: Positive for difficulty urinating.  All other systems reviewed and are negative.     Allergies  Corticosteroids and Minocycline  Home Medications   Prior to Admission medications   Medication Sig Start Date End Date Taking? Authorizing Provider  aspirin 81 MG tablet Take 81 mg by mouth daily.   Yes Historical Provider, MD  dexamethasone (DECADRON) 4 MG tablet 4 mg po bid the day before, day of and day after chemotherapy 11/27/14  Yes Curt Bears, MD  doxazosin (CARDURA) 4 MG tablet Take 4 mg by mouth daily.  10/06/14  Yes Historical Provider, MD  folic acid (FOLVITE) 1 MG tablet Take 1 tablet (1 mg total) by mouth daily. 11/27/14  Yes Curt Bears, MD  HYDROcodone-acetaminophen (NORCO/VICODIN) 5-325 MG per tablet Take 1 tablet by mouth every 6 (six) hours as needed for moderate pain. 01/01/15  Yes Susanne Borders, NP  levothyroxine (SYNTHROID, LEVOTHROID) 50 MCG tablet Take 50 mcg by mouth daily before breakfast.   Yes Historical Provider, MD  losartan (COZAAR) 100 MG tablet Take 100 mg by mouth daily.   Yes Historical Provider, MD  prochlorperazine (COMPAZINE) 10 MG tablet Take 1 tablet (10 mg total) by mouth every 6 (six) hours as needed for nausea or vomiting. 11/27/14  Yes Curt Bears, MD  verapamil (CALAN-SR) 240 MG CR tablet Take 240 mg by mouth daily.  11/28/14  Yes Historical Provider, MD  ciprofloxacin (CIPRO) 500 MG tablet Take 1 tablet (500 mg total) by mouth 2 (two) times daily. Patient not taking: Reported on 01/08/2015 01/01/15   Susanne Borders, NP   BP 149/50 mmHg  Pulse 64  Temp(Src) 97.7 F (36.5 C) (Oral)  Resp 24  SpO2 96%   Physical Exam  Constitutional: She is oriented to person, place, and time. She appears well-developed and well-nourished. No distress.  HENT:  Head: Normocephalic and atraumatic.  Mouth/Throat: Oropharynx is clear and moist.  Eyes: Conjunctivae and  EOM are normal. Pupils are equal, round, and reactive to light.  Neck: Normal range of motion. Neck supple.  Cardiovascular: Normal rate, regular rhythm and normal heart sounds.   Pulmonary/Chest: Effort normal and breath sounds normal. No respiratory distress. She has no wheezes.  Abdominal: Soft. Bowel sounds are normal. There is no tenderness. There is no guarding.  Musculoskeletal: Normal range of motion. She exhibits no edema.  Neurological: She is alert and oriented to person, place, and time.  Skin: Skin is warm and dry. She is not diaphoretic.  Psychiatric: She has a normal mood and affect.  Nursing note and vitals reviewed.   ED Course  Procedures (including critical care time) Labs Review Labs Reviewed  CBC WITH DIFFERENTIAL/PLATELET - Abnormal; Notable for the following:    WBC 3.6 (*)    RBC 2.79 (*)    Hemoglobin 9.0 (*)    HCT 26.0 (*)    Platelets 57 (*)    Monocytes Relative 27 (*)    All other components within normal limits  BASIC METABOLIC PANEL - Abnormal; Notable for the following:    Sodium 134 (*)    CO2 21 (*)    Glucose, Bld 112 (*)    BUN 26 (*)    Creatinine, Ser 1.99 (*)    Calcium 8.5 (*)    GFR calc non Af Amer 21 (*)    GFR calc Af Amer 24 (*)    All other components within normal limits  URINALYSIS, ROUTINE W REFLEX MICROSCOPIC (NOT AT [facility]) - Abnormal; Notable for the following:    APPearance CLOUDY (*)    All other components within normal limits    Imaging Review No results found.   EKG Interpretation None      MDM   Final diagnoses:  Urinary retention  Elevated serum creatinine   81 year old female here with 2 weeks of urinary retention. She was presented treated for UTI, finished abx earlier today.  Seen by oncologist earlier, had elevated creatinine and was treated with IVF.  Here  Patient is afebrile and nontoxic in appearance. Her abdominal exam is benign. She does endorse a "pressure" sensation over her bladder but she has  no focal tenderness. Lab work as above, serum creatinine has improved from earlier but has not yet returned to baseline. Patient was able to urinate a small amount here.  U/a without signs of infection. Bladder scan performed, >600 mL retained in bladder after urination.  Foley catheter was placed.  Suspect her urinary retention as well as dehydration playing role in her elevated SrCr.  Patient will FU with urology.  Discussed plan with patient, he/she acknowledged understanding and agreed with plan of care.  Return precautions given for new or worsening symptoms.  Case discussed with attending physician, Dr. Thomasene Lot, who evaluated patient and agrees with assessment and plan of care.  Larene Pickett, PA-C 01/09/15 0112  Courteney Julio Alm, MD 01/12/15 [phone]

## 2015-01-08 NOTE — Progress Notes (Signed)
Quick Note:  Call patient with the result and we need to bring her back for IV hydration because of the elevated serum creatinine. ______

## 2015-01-08 NOTE — Patient Instructions (Signed)
Dehydration, Adult Dehydration is when you lose more fluids from the body than you take in. Vital organs like the kidneys, brain, and heart cannot function without a proper amount of fluids and salt. Any loss of fluids from the body can cause dehydration.  CAUSES   Vomiting.  Diarrhea.  Excessive sweating.  Excessive urine output.  Fever. SYMPTOMS  Mild dehydration  Thirst.  Dry lips.  Slightly dry mouth. Moderate dehydration  Very dry mouth.  Sunken eyes.  Skin does not bounce back quickly when lightly pinched and released.  Dark urine and decreased urine production.  Decreased tear production.  Headache. Severe dehydration  Very dry mouth.  Extreme thirst.  Rapid, weak pulse (more than 100 beats per minute at rest).  Cold hands and feet.  Not able to sweat in spite of heat and temperature.  Rapid breathing.  Blue lips.  Confusion and lethargy.  Difficulty being awakened.  Minimal urine production.  No tears. DIAGNOSIS  Your caregiver will diagnose dehydration based on your symptoms and your exam. Blood and urine tests will help confirm the diagnosis. The diagnostic evaluation should also identify the cause of dehydration. TREATMENT  Treatment of mild or moderate dehydration can often be done at home by increasing the amount of fluids that you drink. It is best to drink small amounts of fluid more often. Drinking too much at one time can make vomiting worse. Refer to the home care instructions below. Severe dehydration needs to be treated at the hospital where you will probably be given intravenous (IV) fluids that contain water and electrolytes. HOME CARE INSTRUCTIONS   Ask your caregiver about specific rehydration instructions.  Drink enough fluids to keep your urine clear or pale yellow.  Drink small amounts frequently if you have nausea and vomiting.  Eat as you normally do.  Avoid:  Foods or drinks high in sugar.  Carbonated  drinks.  Juice.  Extremely hot or cold fluids.  Drinks with caffeine.  Fatty, greasy foods.  Alcohol.  Tobacco.  Overeating.  Gelatin desserts.  Wash your hands well to avoid spreading bacteria and viruses.  Only take over-the-counter or prescription medicines for pain, discomfort, or fever as directed by your caregiver.  Ask your caregiver if you should continue all prescribed and over-the-counter medicines.  Keep all follow-up appointments with your caregiver. SEEK MEDICAL CARE IF:  You have abdominal pain and it increases or stays in one area (localizes).  You have a rash, stiff neck, or severe headache.  You are irritable, sleepy, or difficult to awaken.  You are weak, dizzy, or extremely thirsty. SEEK IMMEDIATE MEDICAL CARE IF:   You are unable to keep fluids down or you get worse despite treatment.  You have frequent episodes of vomiting or diarrhea.  You have blood or green matter (bile) in your vomit.  You have blood in your stool or your stool looks black and tarry.  You have not urinated in 6 to 8 hours, or you have only urinated a small amount of very dark urine.  You have a fever.  You faint. MAKE SURE YOU:   Understand these instructions.  Will watch your condition.  Will get help right away if you are not doing well or get worse. Document Released: 06/01/2005 Document Revised: 08/24/2011 Document Reviewed: 01/19/2011 [employer] Patient Information 2015 [employer]. This information is not intended to replace advice given to you by your health care provider. Make sure you discuss any questions you have with your health care   provider.  

## 2015-01-08 NOTE — Telephone Encounter (Signed)
-----   Message from Curt Bears, MD sent at 01/08/2015 10:06 AM EDT ----- Call patient with the result and we need to bring her back for IV hydration because of the elevated serum creatinine.

## 2015-01-09 ENCOUNTER — Telehealth: Payer: Self-pay | Admitting: Medical Oncology

## 2015-01-09 NOTE — Discharge Instructions (Signed)
Leave Foley catheter in place.  Follow with urology--  Call in the morning to schedule follow-up appointment as soon as possible.  Return here for any new or worsening symptoms.

## 2015-01-09 NOTE — Telephone Encounter (Signed)
Pt daughter stated pt left ED with foley . And she sees urologist , McDermott next wed. She finished cipro last week. Asking if pt should keep appt next Tuesday . I told her yes.

## 2015-01-10 ENCOUNTER — Telehealth: Payer: Self-pay | Admitting: *Deleted

## 2015-01-10 NOTE — Telephone Encounter (Addendum)
AT FIRST PT. STATED SHE COULD NOT SEE "THE "HARD INSERTION PART" OF HER FOLEY CATHETER. LATER PT. STATED ONLY THE CLEAR PLASTIC TUBING WAS STICKING OUT OF HER BODY AND SHE COULD NOT SEE THE CATHETER. VERBAL ORDER AND READ BACK TO CYNDEE BACON,NP WHO SPOKE TO DR.MOHAMED- PT. NEEDS TO BE SEEN AT THE UROLOGY OFFICE OR GO TO THE [place] EMERGENCY DEPARTMENT TO BE EVALUATED. CALLED PT.'S HOME AND GAVE THE ABOVE INSTRUCTIONS TO PT.'S DAUGHTER IN LAW, FRAN.

## 2015-01-10 NOTE — Telephone Encounter (Signed)
PT.'S DAUGHTER IN LAW, FRAN CALLED. SHE SPOKE TO A NURSE AT THE UROLOGY OFFICE. THE FOLEY CATHETER IS IN PLACE AND FUNCTIONING PROPERLY.

## 2015-01-15 ENCOUNTER — Other Ambulatory Visit (HOSPITAL_BASED_OUTPATIENT_CLINIC_OR_DEPARTMENT_OTHER): Payer: Medicare Other

## 2015-01-15 ENCOUNTER — Encounter: Payer: Self-pay | Admitting: Internal Medicine

## 2015-01-15 ENCOUNTER — Telehealth: Payer: Self-pay | Admitting: Internal Medicine

## 2015-01-15 ENCOUNTER — Telehealth: Payer: Self-pay | Admitting: *Deleted

## 2015-01-15 ENCOUNTER — Ambulatory Visit: Payer: Medicare Other

## 2015-01-15 ENCOUNTER — Ambulatory Visit (HOSPITAL_BASED_OUTPATIENT_CLINIC_OR_DEPARTMENT_OTHER): Payer: Medicare Other | Admitting: Internal Medicine

## 2015-01-15 VITALS — BP 142/61 | HR 70 | Temp 98.4°F | Resp 18 | Ht 63.0 in | Wt 145.5 lb

## 2015-01-15 DIAGNOSIS — C3491 Malignant neoplasm of unspecified part of right bronchus or lung: Secondary | ICD-10-CM

## 2015-01-15 DIAGNOSIS — N390 Urinary tract infection, site not specified: Secondary | ICD-10-CM

## 2015-01-15 DIAGNOSIS — C3431 Malignant neoplasm of lower lobe, right bronchus or lung: Secondary | ICD-10-CM

## 2015-01-15 DIAGNOSIS — R319 Hematuria, unspecified: Secondary | ICD-10-CM

## 2015-01-15 LAB — CBC WITH DIFFERENTIAL/PLATELET
BASO%: 0.2 % (ref 0.0–2.0)
Basophils Absolute: 0 10*3/uL (ref 0.0–0.1)
EOS ABS: 0 10*3/uL (ref 0.0–0.5)
EOS%: 0.1 % (ref 0.0–7.0)
HEMATOCRIT: 30.4 % — AB (ref 34.8–46.6)
HEMOGLOBIN: 10.3 g/dL — AB (ref 11.6–15.9)
LYMPH%: 15.8 % (ref 14.0–49.7)
MCH: 32.3 pg (ref 25.1–34.0)
MCHC: 34 g/dL (ref 31.5–36.0)
MCV: 95 fL (ref 79.5–101.0)
MONO#: 0.7 10*3/uL (ref 0.1–0.9)
MONO%: 17.3 % — ABNORMAL HIGH (ref 0.0–14.0)
NEUT%: 66.6 % (ref 38.4–76.8)
NEUTROS ABS: 2.9 10*3/uL (ref 1.5–6.5)
Platelets: 314 10*3/uL (ref 145–400)
RBC: 3.2 10*6/uL — ABNORMAL LOW (ref 3.70–5.45)
RDW: 14.9 % — ABNORMAL HIGH (ref 11.2–14.5)
WBC: 4.3 10*3/uL (ref 3.9–10.3)
lymph#: 0.7 10*3/uL — ABNORMAL LOW (ref 0.9–3.3)

## 2015-01-15 LAB — COMPREHENSIVE METABOLIC PANEL (CC13)
ALBUMIN: 3.7 g/dL (ref 3.5–5.0)
ALT: 19 U/L (ref 0–55)
ANION GAP: 11 meq/L (ref 3–11)
AST: 21 U/L (ref 5–34)
Alkaline Phosphatase: 98 U/L (ref 40–150)
BUN: 30 mg/dL — ABNORMAL HIGH (ref 7.0–26.0)
CO2: 25 mEq/L (ref 22–29)
Calcium: 9.5 mg/dL (ref 8.4–10.4)
Chloride: 100 mEq/L (ref 98–109)
Creatinine: 1.6 mg/dL — ABNORMAL HIGH (ref 0.6–1.1)
EGFR: 28 mL/min/{1.73_m2} — AB (ref >90)
Glucose: 127 mg/dl (ref 70–140)
Potassium: 3.9 mEq/L (ref 3.5–5.1)
SODIUM: 136 meq/L (ref 136–145)
Total Bilirubin: 0.45 mg/dL (ref 0.20–1.20)
Total Protein: 6.9 g/dL (ref 6.4–8.3)

## 2015-01-15 NOTE — Telephone Encounter (Signed)
Pt confirmed labs/ov per 08/02 POF, gave pt avs and calendar.... KJ, sent msg to add chemo

## 2015-01-15 NOTE — Telephone Encounter (Signed)
Per staff message and POF I have scheduled appts. Advised scheduler of appts. JMW  

## 2015-01-15 NOTE — Progress Notes (Signed)
[facility] [phone]   [phone]  OFFICE PROGRESS NOTE  GREEN, [name], MD [address], [address] [place] [address]  DIAGNOSIS: Stage IIIA (T3, N1, M0) non-small cell lung cancer, adenocarcinoma with negative EGFR mutation and negative for gene translocation diagnosed in May 2016 in a patient with remote history of smoking. She has multiple satellite nodules that would place her at a higher stage.  GUARDANT 360 Molecular study: positive for BRCA2, NOTCH1, RAF1 and NF![name]  PRIOR THERAPY: None  CURRENT THERAPY: Systemic chemotherapy with carboplatin for an AUC of 5 and Alimta at 500 mg/m given every 3 weeks. Status post 2 cycles.  INTERVAL HISTORY: [name] Carlson 81 y.o. female returns to the clinic today for follow-up visit accompanied by her daughter in law. The patient is feeling fine today with no specific complaints except for mild fatigue. She is tolerating her systemic chemotherapy with carboplatin and Alimta fairly well. She was recently treated for urinary tract infection and she is expected to see Dr. McDermoid tomorrow for reevaluation. She also has mild shortness breath with exertion but no significant chest pain, cough or hemoptysis. The patient denied having any significant weight loss or night sweats. She has no fever or chills. She has no significant nausea or vomiting. She was supposed to start cycle #3 of her treatment today.  MEDICAL HISTORY: Past Medical History  Diagnosis Date  . Hypertension   . External carotid artery stenosis     L side dx'd with doppler 2012  . UTI (lower urinary tract infection)   . Pneumonia   . Thyroid disease   . Cancer     ALLERGIES:  is allergic to corticosteroids and minocycline.  MEDICATIONS:  Current Outpatient Prescriptions  Medication Sig Dispense Refill  . aspirin 81 MG tablet Take 81 mg by mouth daily.    [name] dexamethasone (DECADRON) 4 MG tablet 4 mg po bid the day before,  day of and day after chemotherapy 40 tablet 1  . doxazosin (CARDURA) 4 MG tablet Take 4 mg by mouth daily.     . folic acid (FOLVITE) 1 MG tablet Take 1 tablet (1 mg total) by mouth daily. 30 tablet 4  . levothyroxine (SYNTHROID, LEVOTHROID) 50 MCG tablet Take 50 mcg by mouth daily before breakfast.    . losartan (COZAAR) 100 MG tablet Take 100 mg by mouth daily.    . verapamil (CALAN-SR) 240 MG CR tablet Take 240 mg by mouth daily.     [name] HYDROcodone-acetaminophen (NORCO/VICODIN) 5-325 MG per tablet Take 1 tablet by mouth every 6 (six) hours as needed for moderate pain. (Patient not taking: Reported on 01/15/2015) 20 tablet 0  . prochlorperazine (COMPAZINE) 10 MG tablet Take 1 tablet (10 mg total) by mouth every 6 (six) hours as needed for nausea or vomiting. (Patient not taking: Reported on 01/15/2015) 30 tablet 0   No current facility-administered medications for this visit.    SURGICAL HISTORY:  Past Surgical History  Procedure Laterality Date  . Video bronchoscopy Bilateral 04/06/2014    Procedure: VIDEO BRONCHOSCOPY WITH FLUORO;  Surgeon: Juanito Doom, MD;  Location: [facility];  Service: Cardiopulmonary;  Laterality: Bilateral;  . Eye surgery      REVIEW OF SYSTEMS:  Constitutional: positive for fatigue Eyes: negative Ears, nose, mouth, throat, and face: negative Respiratory: positive for dyspnea on exertion Cardiovascular: negative Gastrointestinal: negative Genitourinary:negative Integument/breast: negative Hematologic/lymphatic: negative Musculoskeletal:negative Neurological: negative Behavioral/Psych: negative Endocrine: negative Allergic/Immunologic:  negative   PHYSICAL EXAMINATION: General appearance: alert, cooperative, fatigued and no distress Head: Normocephalic, without obvious abnormality, atraumatic Neck: no adenopathy, no JVD, supple, symmetrical, trachea midline and thyroid not enlarged, symmetric, no tenderness/mass/nodules Lymph nodes: Cervical,  supraclavicular, and axillary nodes normal. Resp: rales RLL Back: symmetric, no curvature. ROM normal. No CVA tenderness. Cardio: regular rate and rhythm, S1, S2 normal, no murmur, click, rub or gallop GI: soft, non-tender; bowel sounds normal; no masses,  no organomegaly Extremities: extremities normal, atraumatic, no cyanosis or edema Neurologic: Alert and oriented X 3, normal strength and tone. Normal symmetric reflexes. Normal coordination and gait  ECOG PERFORMANCE STATUS: 1 - Symptomatic but completely ambulatory  Blood pressure 142/61, pulse 70, temperature 98.4 F (36.9 C), temperature source Oral, resp. rate 18, height '5\' 3"'  (1.6 m), weight 145 lb 8 oz (65.998 kg), SpO2 96 %.  LABORATORY DATA: Lab Results  Component Value Date   WBC 4.3 01/15/2015   HGB 10.3* 01/15/2015   HCT 30.4* 01/15/2015   MCV 95.0 01/15/2015   PLT 314 01/15/2015      Chemistry      Component Value Date/Time   NA 134* 01/08/2015 2235   NA 138 01/08/2015 0902   K 3.6 01/08/2015 2235   K 3.6 01/08/2015 0902   CL 102 01/08/2015 2235   CO2 21* 01/08/2015 2235   CO2 21* 01/08/2015 0902   BUN 26* 01/08/2015 2235   BUN 25.2 01/08/2015 0902   CREATININE 1.99* 01/08/2015 2235   CREATININE 2.2* 01/08/2015 0902   CREATININE 0.79 09/10/2014 1439      Component Value Date/Time   CALCIUM 8.5* 01/08/2015 2235   CALCIUM 8.8 01/08/2015 0902   ALKPHOS 94 01/08/2015 0902   ALKPHOS 91 09/04/2014 1924   AST 19 01/08/2015 0902   AST 17 09/04/2014 1924   ALT 21 01/08/2015 0902   ALT 11 09/04/2014 1924   BILITOT 0.65 01/08/2015 0902   BILITOT 0.9 09/04/2014 1924       RADIOGRAPHIC STUDIES: No results found.  ASSESSMENT AND PLAN: This is a very pleasant 81 years old white female with stage IIIA/B non-small cell lung cancer, adenocarcinoma involving the right lower lobe with hilar lymphadenopathy as well as several satellite lesions. The molecular study showed no evidence for target mutations. She is  currently on treatment with carboplatin and Alimta status post 2 cycles and has been to rating her treatment fairly well. Her serum creatinine is still elevated today. I will delay the start of cycle #3 by 1 week until improvement in her renal function. For the recent urinary tract infection, the patient will continue her evaluation with her urologist. She would come back for follow-up visit in one week for reevaluation before resuming her systemic chemotherapy. She was advised to call immediately if she has any concerning symptoms in the interval. The patient voices understanding of current disease status and treatment options and is in agreement with the current care plan.  All questions were answered. The patient knows to call the clinic with any problems, questions or concerns. We can certainly see the patient much sooner if necessary.  Disclaimer: This note was dictated with voice recognition software. Similar sounding words can inadvertently be transcribed and may not be corrected upon review.

## 2015-01-22 ENCOUNTER — Ambulatory Visit (HOSPITAL_BASED_OUTPATIENT_CLINIC_OR_DEPARTMENT_OTHER): Payer: Medicare Other

## 2015-01-22 ENCOUNTER — Other Ambulatory Visit (HOSPITAL_BASED_OUTPATIENT_CLINIC_OR_DEPARTMENT_OTHER): Payer: Medicare Other

## 2015-01-22 ENCOUNTER — Ambulatory Visit (HOSPITAL_BASED_OUTPATIENT_CLINIC_OR_DEPARTMENT_OTHER): Payer: Medicare Other | Admitting: Internal Medicine

## 2015-01-22 ENCOUNTER — Encounter: Payer: Self-pay | Admitting: Internal Medicine

## 2015-01-22 ENCOUNTER — Telehealth: Payer: Self-pay | Admitting: Internal Medicine

## 2015-01-22 VITALS — BP 146/52 | HR 71 | Temp 97.8°F | Resp 17 | Ht 63.0 in | Wt 145.9 lb

## 2015-01-22 DIAGNOSIS — C3431 Malignant neoplasm of lower lobe, right bronchus or lung: Secondary | ICD-10-CM

## 2015-01-22 DIAGNOSIS — R5383 Other fatigue: Secondary | ICD-10-CM

## 2015-01-22 DIAGNOSIS — C3491 Malignant neoplasm of unspecified part of right bronchus or lung: Secondary | ICD-10-CM

## 2015-01-22 DIAGNOSIS — Z5111 Encounter for antineoplastic chemotherapy: Secondary | ICD-10-CM | POA: Diagnosis not present

## 2015-01-22 DIAGNOSIS — R319 Hematuria, unspecified: Secondary | ICD-10-CM

## 2015-01-22 DIAGNOSIS — N390 Urinary tract infection, site not specified: Secondary | ICD-10-CM

## 2015-01-22 LAB — CBC WITH DIFFERENTIAL/PLATELET
BASO%: 0.1 % (ref 0.0–2.0)
Basophils Absolute: 0 10*3/uL (ref 0.0–0.1)
EOS ABS: 0 10*3/uL (ref 0.0–0.5)
EOS%: 0 % (ref 0.0–7.0)
HCT: 29.6 % — ABNORMAL LOW (ref 34.8–46.6)
HEMOGLOBIN: 10.2 g/dL — AB (ref 11.6–15.9)
LYMPH#: 0.7 10*3/uL — AB (ref 0.9–3.3)
LYMPH%: 6.9 % — AB (ref 14.0–49.7)
MCH: 32.9 pg (ref 25.1–34.0)
MCHC: 34.6 g/dL (ref 31.5–36.0)
MCV: 95.1 fL (ref 79.5–101.0)
MONO#: 1.1 10*3/uL — ABNORMAL HIGH (ref 0.1–0.9)
MONO%: 10.2 % (ref 0.0–14.0)
NEUT%: 82.8 % — AB (ref 38.4–76.8)
NEUTROS ABS: 8.7 10*3/uL — AB (ref 1.5–6.5)
PLATELETS: 324 10*3/uL (ref 145–400)
RBC: 3.11 10*6/uL — AB (ref 3.70–5.45)
RDW: 16.6 % — ABNORMAL HIGH (ref 11.2–14.5)
WBC: 10.5 10*3/uL — ABNORMAL HIGH (ref 3.9–10.3)

## 2015-01-22 LAB — COMPREHENSIVE METABOLIC PANEL (CC13)
ALT: 36 U/L (ref 0–55)
AST: 33 U/L (ref 5–34)
Albumin: 3.5 g/dL (ref 3.5–5.0)
Alkaline Phosphatase: 106 U/L (ref 40–150)
Anion Gap: 12 mEq/L — ABNORMAL HIGH (ref 3–11)
BILIRUBIN TOTAL: 0.43 mg/dL (ref 0.20–1.20)
BUN: 20.5 mg/dL (ref 7.0–26.0)
CHLORIDE: 105 meq/L (ref 98–109)
CO2: 21 meq/L — AB (ref 22–29)
CREATININE: 1.2 mg/dL — AB (ref 0.6–1.1)
Calcium: 9.3 mg/dL (ref 8.4–10.4)
EGFR: 41 mL/min/{1.73_m2} — AB (ref >90)
Glucose: 177 mg/dl — ABNORMAL HIGH (ref 70–140)
POTASSIUM: 3.3 meq/L — AB (ref 3.5–5.1)
SODIUM: 138 meq/L (ref 136–145)
Total Protein: 6.8 g/dL (ref 6.4–8.3)

## 2015-01-22 MED ORDER — SODIUM CHLORIDE 0.9 % IV SOLN
228.5000 mg | Freq: Once | INTRAVENOUS | Status: AC
  Administered 2015-01-22: 230 mg via INTRAVENOUS
  Filled 2015-01-22: qty 23

## 2015-01-22 MED ORDER — PEMETREXED DISODIUM CHEMO INJECTION 500 MG
400.0000 mg/m2 | Freq: Once | INTRAVENOUS | Status: AC
  Administered 2015-01-22: 700 mg via INTRAVENOUS
  Filled 2015-01-22: qty 28

## 2015-01-22 MED ORDER — SODIUM CHLORIDE 0.9 % IV SOLN
Freq: Once | INTRAVENOUS | Status: AC
  Administered 2015-01-22: 15:00:00 via INTRAVENOUS
  Filled 2015-01-22: qty 8

## 2015-01-22 MED ORDER — SODIUM CHLORIDE 0.9 % IV SOLN
Freq: Once | INTRAVENOUS | Status: AC
  Administered 2015-01-22: 14:00:00 via INTRAVENOUS

## 2015-01-22 NOTE — Telephone Encounter (Signed)
Gave and printed appt sched and avs for pt for Aug °

## 2015-01-22 NOTE — Progress Notes (Signed)
[place] [phone]   [phone]  OFFICE PROGRESS NOTE  GREEN, [name] Bachelor, MD [address] [place] [address]  DIAGNOSIS: Stage IIIA (T3, N1, M0) non-small cell lung cancer, adenocarcinoma with negative EGFR mutation and negative for gene translocation diagnosed in May 2016 in a patient with remote history of smoking. She has multiple satellite nodules that would place her at a higher stage.  GUARDANT 360 Molecular study: positive for BRCA2, NOTCH1, RAF1 and NF![name]  PRIOR THERAPY: None  CURRENT THERAPY: Systemic chemotherapy with carboplatin for an AUC of 5 and Alimta at 500 mg/m given every 3 weeks. Status post 2 cycles.  INTERVAL HISTORY: [name] Carlson 81 y.o. female returns to the clinic today for follow-up visit accompanied by her friend. The patient is feeling fine today with no specific complaints except for mild fatigue. She had significant improvement in her renal function over the last 2 weeks. She also has mild shortness breath with exertion but no significant chest pain, cough or hemoptysis. The patient denied having any significant weight loss or night sweats. She has no fever or chills. She has no significant nausea or vomiting. She is here today to start cycle #3 of her systemic chemotherapy.  MEDICAL HISTORY: Past Medical History  Diagnosis Date  . Hypertension   . External carotid artery stenosis     L side dx'd with doppler 2012  . UTI (lower urinary tract infection)   . Pneumonia   . Thyroid disease   . Cancer     ALLERGIES:  is allergic to corticosteroids and minocycline.  MEDICATIONS:  Current Outpatient Prescriptions  Medication Sig Dispense Refill  . aspirin 81 MG tablet Take 81 mg by mouth daily.    [name] dexamethasone (DECADRON) 4 MG tablet 4 mg po bid the day before, day of and day after chemotherapy 40 tablet 1  . doxazosin (CARDURA) 4 MG tablet Take 4 mg by mouth daily.     . folic acid  (FOLVITE) 1 MG tablet Take 1 tablet (1 mg total) by mouth daily. 30 tablet 4  . HYDROcodone-acetaminophen (NORCO/VICODIN) 5-325 MG per tablet Take 1 tablet by mouth every 6 (six) hours as needed for moderate pain. 20 tablet 0  . levothyroxine (SYNTHROID, LEVOTHROID) 50 MCG tablet Take 50 mcg by mouth daily before breakfast.    . losartan (COZAAR) 100 MG tablet Take 100 mg by mouth daily.    . prochlorperazine (COMPAZINE) 10 MG tablet Take 1 tablet (10 mg total) by mouth every 6 (six) hours as needed for nausea or vomiting. 30 tablet 0  . verapamil (CALAN-SR) 240 MG CR tablet Take 240 mg by mouth daily.      No current facility-administered medications for this visit.    SURGICAL HISTORY:  Past Surgical History  Procedure Laterality Date  . Video bronchoscopy Bilateral 04/06/2014    Procedure: VIDEO BRONCHOSCOPY WITH FLUORO;  Surgeon: Juanito Doom, MD;  Location: [facility];  Service: Cardiopulmonary;  Laterality: Bilateral;  . Eye surgery      REVIEW OF SYSTEMS:  Constitutional: positive for fatigue Eyes: negative Ears, nose, mouth, throat, and face: negative Respiratory: positive for dyspnea on exertion Cardiovascular: negative Gastrointestinal: negative Genitourinary:negative Integument/breast: negative Hematologic/lymphatic: negative Musculoskeletal:negative Neurological: negative Behavioral/Psych: negative Endocrine: negative Allergic/Immunologic: negative   PHYSICAL EXAMINATION: General appearance: alert, cooperative, fatigued and no distress Head: Normocephalic, without obvious abnormality, atraumatic Neck: no adenopathy, no JVD, supple, symmetrical, trachea midline and thyroid not  enlarged, symmetric, no tenderness/mass/nodules Lymph nodes: Cervical, supraclavicular, and axillary nodes normal. Resp: rales RLL Back: symmetric, no curvature. ROM normal. No CVA tenderness. Cardio: regular rate and rhythm, S1, S2 normal, no murmur, click, rub or gallop GI: soft,  non-tender; bowel sounds normal; no masses,  no organomegaly Extremities: extremities normal, atraumatic, no cyanosis or edema Neurologic: Alert and oriented X 3, normal strength and tone. Normal symmetric reflexes. Normal coordination and gait  ECOG PERFORMANCE STATUS: 1 - Symptomatic but completely ambulatory  Blood pressure 146/52, pulse 71, temperature 97.8 F (36.6 C), temperature source Oral, resp. rate 17, height '5\' 3"'  (1.6 m), weight 145 lb 14.4 oz (66.18 kg), SpO2 97 %.  LABORATORY DATA: Lab Results  Component Value Date   WBC 10.5* 01/22/2015   HGB 10.2* 01/22/2015   HCT 29.6* 01/22/2015   MCV 95.1 01/22/2015   PLT 324 01/22/2015      Chemistry      Component Value Date/Time   NA 138 01/22/2015 1151   NA 134* 01/08/2015 2235   K 3.3* 01/22/2015 1151   K 3.6 01/08/2015 2235   CL 102 01/08/2015 2235   CO2 21* 01/22/2015 1151   CO2 21* 01/08/2015 2235   BUN 20.5 01/22/2015 1151   BUN 26* 01/08/2015 2235   CREATININE 1.2* 01/22/2015 1151   CREATININE 1.99* 01/08/2015 2235   CREATININE 0.79 09/10/2014 1439      Component Value Date/Time   CALCIUM 9.3 01/22/2015 1151   CALCIUM 8.5* 01/08/2015 2235   ALKPHOS 106 01/22/2015 1151   ALKPHOS 91 09/04/2014 1924   AST 33 01/22/2015 1151   AST 17 09/04/2014 1924   ALT 36 01/22/2015 1151   ALT 11 09/04/2014 1924   BILITOT 0.43 01/22/2015 1151   BILITOT 0.9 09/04/2014 1924       RADIOGRAPHIC STUDIES: No results found.  ASSESSMENT AND PLAN: This is a very pleasant 81 years old white female with stage IIIA/B non-small cell lung cancer, adenocarcinoma involving the right lower lobe with hilar lymphadenopathy as well as several satellite lesions. The molecular study showed no evidence for target mutations. She is currently on treatment with carboplatin and Alimta status post 2 cycles and has been to rating her treatment fairly well.  The patient is feeling much better today and her serum creatinine has improved. I  recommended for her to proceed with cycle #3 today as a scheduled but I will reduce the dose of Alimta to 400 MG/M2 this cycle because of her mildly elevated serum creatinine. The patient would come back for follow-up visit in 3 weeks for reevaluation after repeating CT scan of the chest, abdomen and pelvis without contrast. She was advised to call immediately if she has any concerning symptoms in the interval. The patient voices understanding of current disease status and treatment options and is in agreement with the current care plan.  All questions were answered. The patient knows to call the clinic with any problems, questions or concerns. We can certainly see the patient much sooner if necessary.  Disclaimer: This note was dictated with voice recognition software. Similar sounding words can inadvertently be transcribed and may not be corrected upon review.

## 2015-01-22 NOTE — Patient Instructions (Signed)
[facility] Discharge Instructions for Patients Receiving Chemotherapy  Today you received the following chemotherapy agents Alimta,carboplatin To help prevent nausea and vomiting after your treatment, we encourage you to take your nausea medication as prescribed.   If you develop nausea and vomiting that is not controlled by your nausea medication, call the clinic.   BELOW ARE SYMPTOMS THAT SHOULD BE REPORTED IMMEDIATELY:  *FEVER GREATER THAN 100.5 F  *CHILLS WITH OR WITHOUT FEVER  NAUSEA AND VOMITING THAT IS NOT CONTROLLED WITH YOUR NAUSEA MEDICATION  *UNUSUAL SHORTNESS OF BREATH  *UNUSUAL BRUISING OR BLEEDING  TENDERNESS IN MOUTH AND THROAT WITH OR WITHOUT PRESENCE OF ULCERS  *URINARY PROBLEMS  *BOWEL PROBLEMS  UNUSUAL RASH Items with * indicate a potential emergency and should be followed up as soon as possible.  Feel free to call the clinic you have any questions or concerns. The clinic phone number is [phone].  Please show the [facility] at check-in to the Emergency Department and triage nurse.

## 2015-01-29 ENCOUNTER — Other Ambulatory Visit (HOSPITAL_BASED_OUTPATIENT_CLINIC_OR_DEPARTMENT_OTHER): Payer: Medicare Other

## 2015-01-29 DIAGNOSIS — C3431 Malignant neoplasm of lower lobe, right bronchus or lung: Secondary | ICD-10-CM

## 2015-01-29 DIAGNOSIS — C3491 Malignant neoplasm of unspecified part of right bronchus or lung: Secondary | ICD-10-CM

## 2015-01-29 LAB — CBC WITH DIFFERENTIAL/PLATELET
BASO%: 0 % (ref 0.0–2.0)
Basophils Absolute: 0 10*3/uL (ref 0.0–0.1)
EOS%: 2.5 % (ref 0.0–7.0)
Eosinophils Absolute: 0.1 10*3/uL (ref 0.0–0.5)
HCT: 26.4 % — ABNORMAL LOW (ref 34.8–46.6)
HGB: 9 g/dL — ABNORMAL LOW (ref 11.6–15.9)
LYMPH%: 15.8 % (ref 14.0–49.7)
MCH: 32.1 pg (ref 25.1–34.0)
MCHC: 34.1 g/dL (ref 31.5–36.0)
MCV: 94.3 fL (ref 79.5–101.0)
MONO#: 0.3 10*3/uL (ref 0.1–0.9)
MONO%: 7.5 % (ref 0.0–14.0)
NEUT#: 2.7 10*3/uL (ref 1.5–6.5)
NEUT%: 74.2 % (ref 38.4–76.8)
Platelets: 108 10*3/uL — ABNORMAL LOW (ref 145–400)
RBC: 2.8 10*6/uL — AB (ref 3.70–5.45)
RDW: 15.4 % — ABNORMAL HIGH (ref 11.2–14.5)
WBC: 3.6 10*3/uL — ABNORMAL LOW (ref 3.9–10.3)
lymph#: 0.6 10*3/uL — ABNORMAL LOW (ref 0.9–3.3)

## 2015-01-29 LAB — COMPREHENSIVE METABOLIC PANEL (CC13)
ALT: 20 U/L (ref 0–55)
AST: 16 U/L (ref 5–34)
Albumin: 3 g/dL — ABNORMAL LOW (ref 3.5–5.0)
Alkaline Phosphatase: 83 U/L (ref 40–150)
Anion Gap: 10 mEq/L (ref 3–11)
BUN: 17.2 mg/dL (ref 7.0–26.0)
CHLORIDE: 106 meq/L (ref 98–109)
CO2: 21 meq/L — AB (ref 22–29)
Calcium: 8.3 mg/dL — ABNORMAL LOW (ref 8.4–10.4)
Creatinine: 0.9 mg/dL (ref 0.6–1.1)
EGFR: 59 mL/min/{1.73_m2} — ABNORMAL LOW (ref >90)
Glucose: 128 mg/dl (ref 70–140)
POTASSIUM: 3.1 meq/L — AB (ref 3.5–5.1)
Sodium: 137 mEq/L (ref 136–145)
Total Bilirubin: 0.8 mg/dL (ref 0.20–1.20)
Total Protein: 5.7 g/dL — ABNORMAL LOW (ref 6.4–8.3)

## 2015-01-30 NOTE — Progress Notes (Signed)
Quick Note:  Call patient with the result and order K Dur 20 meq po qd X 7 days ______ 

## 2015-01-31 ENCOUNTER — Telehealth: Payer: Self-pay | Admitting: Medical Oncology

## 2015-01-31 DIAGNOSIS — E876 Hypokalemia: Secondary | ICD-10-CM

## 2015-01-31 MED ORDER — POTASSIUM CHLORIDE CRYS ER 20 MEQ PO TBCR: 20.0000 meq | EXTENDED_RELEASE_TABLET | Freq: Every day | ORAL | Status: DC

## 2015-01-31 NOTE — Telephone Encounter (Signed)
-----   Message from Curt Bears, MD sent at 01/30/2015  8:05 PM EDT ----- Call patient with the result and order K Dur 20 meq po qd X 7 days.

## 2015-01-31 NOTE — Telephone Encounter (Signed)
Pt.notified

## 2015-02-05 ENCOUNTER — Other Ambulatory Visit (HOSPITAL_BASED_OUTPATIENT_CLINIC_OR_DEPARTMENT_OTHER): Payer: Medicare Other

## 2015-02-05 DIAGNOSIS — C3431 Malignant neoplasm of lower lobe, right bronchus or lung: Secondary | ICD-10-CM

## 2015-02-05 DIAGNOSIS — C3491 Malignant neoplasm of unspecified part of right bronchus or lung: Secondary | ICD-10-CM

## 2015-02-05 LAB — COMPREHENSIVE METABOLIC PANEL (CC13)
ALBUMIN: 3.1 g/dL — AB (ref 3.5–5.0)
ALK PHOS: 99 U/L (ref 40–150)
ALT: 27 U/L (ref 0–55)
AST: 25 U/L (ref 5–34)
Anion Gap: 13 mEq/L — ABNORMAL HIGH (ref 3–11)
BILIRUBIN TOTAL: 0.54 mg/dL (ref 0.20–1.20)
BUN: 13 mg/dL (ref 7.0–26.0)
CO2: 20 mEq/L — ABNORMAL LOW (ref 22–29)
Calcium: 8.7 mg/dL (ref 8.4–10.4)
Chloride: 108 mEq/L (ref 98–109)
Creatinine: 0.9 mg/dL (ref 0.6–1.1)
EGFR: 55 mL/min/{1.73_m2} — ABNORMAL LOW (ref >90)
Glucose: 120 mg/dl (ref 70–140)
POTASSIUM: 4.2 meq/L (ref 3.5–5.1)
SODIUM: 140 meq/L (ref 136–145)
TOTAL PROTEIN: 5.8 g/dL — AB (ref 6.4–8.3)

## 2015-02-05 LAB — CBC WITH DIFFERENTIAL/PLATELET
BASO%: 0.3 % (ref 0.0–2.0)
BASOS ABS: 0 10*3/uL (ref 0.0–0.1)
EOS ABS: 0.1 10*3/uL (ref 0.0–0.5)
EOS%: 4.1 % (ref 0.0–7.0)
HCT: 24.7 % — ABNORMAL LOW (ref 34.8–46.6)
HEMOGLOBIN: 8.3 g/dL — AB (ref 11.6–15.9)
LYMPH%: 25.5 % (ref 14.0–49.7)
MCH: 32.7 pg (ref 25.1–34.0)
MCHC: 33.5 g/dL (ref 31.5–36.0)
MCV: 97.6 fL (ref 79.5–101.0)
MONO#: 0.6 10*3/uL (ref 0.1–0.9)
MONO%: 22.6 % — AB (ref 0.0–14.0)
NEUT#: 1.3 10*3/uL — ABNORMAL LOW (ref 1.5–6.5)
NEUT%: 47.5 % (ref 38.4–76.8)
Platelets: 100 10*3/uL — ABNORMAL LOW (ref 145–400)
RBC: 2.53 10*6/uL — ABNORMAL LOW (ref 3.70–5.45)
RDW: 16.5 % — AB (ref 11.2–14.5)
WBC: 2.8 10*3/uL — ABNORMAL LOW (ref 3.9–10.3)
lymph#: 0.7 10*3/uL — ABNORMAL LOW (ref 0.9–3.3)

## 2015-02-11 ENCOUNTER — Ambulatory Visit (HOSPITAL_COMMUNITY)
Admission: RE | Admit: 2015-02-11 | Discharge: 2015-02-11 | Disposition: A | Discharge status: Home / Self Care | Payer: Medicare Other | Source: Ambulatory Visit | Attending: Internal Medicine | Admitting: Internal Medicine

## 2015-02-11 DIAGNOSIS — R5383 Other fatigue: Secondary | ICD-10-CM | POA: Insufficient documentation

## 2015-02-11 DIAGNOSIS — C3491 Malignant neoplasm of unspecified part of right bronchus or lung: Secondary | ICD-10-CM

## 2015-02-11 DIAGNOSIS — K5730 Diverticulosis of large intestine without perforation or abscess without bleeding: Secondary | ICD-10-CM | POA: Diagnosis not present

## 2015-02-11 DIAGNOSIS — C3431 Malignant neoplasm of lower lobe, right bronchus or lung: Secondary | ICD-10-CM | POA: Insufficient documentation

## 2015-02-11 DIAGNOSIS — N390 Urinary tract infection, site not specified: Secondary | ICD-10-CM | POA: Diagnosis present

## 2015-02-11 DIAGNOSIS — R319 Hematuria, unspecified: Secondary | ICD-10-CM

## 2015-02-11 DIAGNOSIS — N319 Neuromuscular dysfunction of bladder, unspecified: Secondary | ICD-10-CM | POA: Diagnosis not present

## 2015-02-12 ENCOUNTER — Telehealth: Payer: Self-pay | Admitting: Physician Assistant

## 2015-02-12 ENCOUNTER — Telehealth: Payer: Self-pay | Admitting: *Deleted

## 2015-02-12 ENCOUNTER — Ambulatory Visit (HOSPITAL_BASED_OUTPATIENT_CLINIC_OR_DEPARTMENT_OTHER): Payer: Medicare Other | Admitting: Physician Assistant

## 2015-02-12 ENCOUNTER — Other Ambulatory Visit (HOSPITAL_BASED_OUTPATIENT_CLINIC_OR_DEPARTMENT_OTHER): Payer: Medicare Other

## 2015-02-12 ENCOUNTER — Ambulatory Visit (HOSPITAL_BASED_OUTPATIENT_CLINIC_OR_DEPARTMENT_OTHER): Payer: Medicare Other

## 2015-02-12 ENCOUNTER — Encounter: Payer: Self-pay | Admitting: Physician Assistant

## 2015-02-12 VITALS — BP 106/41 | HR 69 | Temp 97.3°F | Resp 17 | Ht 63.0 in | Wt 143.7 lb

## 2015-02-12 DIAGNOSIS — R5383 Other fatigue: Secondary | ICD-10-CM

## 2015-02-12 DIAGNOSIS — C3491 Malignant neoplasm of unspecified part of right bronchus or lung: Secondary | ICD-10-CM

## 2015-02-12 DIAGNOSIS — N390 Urinary tract infection, site not specified: Secondary | ICD-10-CM

## 2015-02-12 DIAGNOSIS — C3431 Malignant neoplasm of lower lobe, right bronchus or lung: Secondary | ICD-10-CM

## 2015-02-12 DIAGNOSIS — R319 Hematuria, unspecified: Secondary | ICD-10-CM

## 2015-02-12 DIAGNOSIS — Z5111 Encounter for antineoplastic chemotherapy: Secondary | ICD-10-CM | POA: Diagnosis not present

## 2015-02-12 LAB — COMPREHENSIVE METABOLIC PANEL (CC13)
ALBUMIN: 3.3 g/dL — AB (ref 3.5–5.0)
ALK PHOS: 108 U/L (ref 40–150)
ALT: 18 U/L (ref 0–55)
ANION GAP: 10 meq/L (ref 3–11)
AST: 20 U/L (ref 5–34)
BILIRUBIN TOTAL: 0.35 mg/dL (ref 0.20–1.20)
BUN: 10.5 mg/dL (ref 7.0–26.0)
CALCIUM: 9 mg/dL (ref 8.4–10.4)
CO2: 20 mEq/L — ABNORMAL LOW (ref 22–29)
CREATININE: 1 mg/dL (ref 0.6–1.1)
Chloride: 108 mEq/L (ref 98–109)
EGFR: 51 mL/min/{1.73_m2} — ABNORMAL LOW (ref >90)
Glucose: 136 mg/dl (ref 70–140)
Potassium: 4.3 mEq/L (ref 3.5–5.1)
Sodium: 138 mEq/L (ref 136–145)
TOTAL PROTEIN: 6.3 g/dL — AB (ref 6.4–8.3)

## 2015-02-12 LAB — CBC WITH DIFFERENTIAL/PLATELET
BASO%: 0.3 % (ref 0.0–2.0)
Basophils Absolute: 0 10*3/uL (ref 0.0–0.1)
EOS%: 2 % (ref 0.0–7.0)
Eosinophils Absolute: 0.1 10*3/uL (ref 0.0–0.5)
HEMATOCRIT: 26.5 % — AB (ref 34.8–46.6)
HEMOGLOBIN: 8.9 g/dL — AB (ref 11.6–15.9)
LYMPH#: 1 10*3/uL (ref 0.9–3.3)
LYMPH%: 22 % (ref 14.0–49.7)
MCH: 33.2 pg (ref 25.1–34.0)
MCHC: 33.6 g/dL (ref 31.5–36.0)
MCV: 98.7 fL (ref 79.5–101.0)
MONO#: 1.1 10*3/uL — AB (ref 0.1–0.9)
MONO%: 24.4 % — ABNORMAL HIGH (ref 0.0–14.0)
NEUT#: 2.3 10*3/uL (ref 1.5–6.5)
NEUT%: 51.3 % (ref 38.4–76.8)
Platelets: 444 10*3/uL — ABNORMAL HIGH (ref 145–400)
RBC: 2.69 10*6/uL — ABNORMAL LOW (ref 3.70–5.45)
RDW: 18.9 % — ABNORMAL HIGH (ref 11.2–14.5)
WBC: 4.5 10*3/uL (ref 3.9–10.3)

## 2015-02-12 LAB — TECHNOLOGIST REVIEW

## 2015-02-12 MED ORDER — SODIUM CHLORIDE 0.9 % IV SOLN
400.0000 mg/m2 | Freq: Once | INTRAVENOUS | Status: AC
  Administered 2015-02-12: 700 mg via INTRAVENOUS
  Filled 2015-02-12: qty 28

## 2015-02-12 MED ORDER — SODIUM CHLORIDE 0.9 % IV SOLN
Freq: Once | INTRAVENOUS | Status: AC
  Administered 2015-02-12: 13:00:00 via INTRAVENOUS
  Filled 2015-02-12: qty 8

## 2015-02-12 MED ORDER — CYANOCOBALAMIN 1000 MCG/ML IJ SOLN
INTRAMUSCULAR | Status: AC
  Filled 2015-02-12: qty 1

## 2015-02-12 MED ORDER — CYANOCOBALAMIN 1000 MCG/ML IJ SOLN
1000.0000 ug | Freq: Once | INTRAMUSCULAR | Status: AC
  Administered 2015-02-12: 1000 ug via INTRAMUSCULAR

## 2015-02-12 MED ORDER — SODIUM CHLORIDE 0.9 % IV SOLN
Freq: Once | INTRAVENOUS | Status: AC
  Administered 2015-02-12: 13:00:00 via INTRAVENOUS

## 2015-02-12 MED ORDER — SODIUM CHLORIDE 0.9 % IV SOLN
264.8000 mg | Freq: Once | INTRAVENOUS | Status: AC
  Administered 2015-02-12: 260 mg via INTRAVENOUS
  Filled 2015-02-12: qty 26

## 2015-02-12 NOTE — Telephone Encounter (Signed)
Per staff message and POF I have scheduled appts. Advised scheduler of appts. JMW  

## 2015-02-12 NOTE — Telephone Encounter (Signed)
per pof to sch pt appt-sent Dr Burr Medico email to adv no appt avaiable on 9/6-pt stated was exausted and will look on MY CHART for updated sch

## 2015-02-12 NOTE — Telephone Encounter (Signed)
per pof to sch pt appt-sent Mohamed email to adv scheduled pt with Mohamed-sent emial to MW to sch trmt-will call pt after reply

## 2015-02-12 NOTE — Patient Instructions (Signed)
[facility] Discharge Instructions for Patients Receiving Chemotherapy  Today you received the following chemotherapy agents Alimta and Carboplatin.  To help prevent nausea and vomiting after your treatment, we encourage you to take your nausea medication as prescribed.   If you develop nausea and vomiting that is not controlled by your nausea medication, call the clinic.   BELOW ARE SYMPTOMS THAT SHOULD BE REPORTED IMMEDIATELY:  *FEVER GREATER THAN 100.5 F  *CHILLS WITH OR WITHOUT FEVER  NAUSEA AND VOMITING THAT IS NOT CONTROLLED WITH YOUR NAUSEA MEDICATION  *UNUSUAL SHORTNESS OF BREATH  *UNUSUAL BRUISING OR BLEEDING  TENDERNESS IN MOUTH AND THROAT WITH OR WITHOUT PRESENCE OF ULCERS  *URINARY PROBLEMS  *BOWEL PROBLEMS  UNUSUAL RASH Items with * indicate a potential emergency and should be followed up as soon as possible.  Feel free to call the clinic you have any questions or concerns. The clinic phone number is [phone].  Please show the CHEMO ALERT CARD at check-in to the Emergency Department and triage nurse.   

## 2015-02-12 NOTE — Progress Notes (Addendum)
[facility] [phone]   [phone]  OFFICE PROGRESS NOTE  GREEN, [name], MD [address], [address] [place] [address]  DIAGNOSIS: Stage IIIA (T3, N1, M0) non-small cell lung cancer, adenocarcinoma with negative EGFR mutation and negative for gene translocation diagnosed in May 2016 in a patient with remote history of smoking. She has multiple satellite nodules that would place her at a higher stage.  [facility] Molecular study: positive for BRCA2, NOTCH1, RAF1 and NF![name]  PRIOR THERAPY: None  CURRENT THERAPY: Systemic chemotherapy with carboplatin for an AUC of 5 and Alimta at 500 mg/m given every 3 weeks. Status post 3 cycles.  INTERVAL HISTORY: [name] Carlson 81 y.o. female returns to the clinic today for follow-up visit accompanied by her friend. The patient is feeling fine today with no specific complaints except for mild fatigue. She reports that her primary care physician, Dr. Nyoka Cowden, is concerned about her blood counts. She has had some reduction in her hemoglobin at a level that is expected with her systemic chemotherapy. She reports that she tolerated the dose reduction in her chemotherapy with carboplatin and Alimta much better.  She also has mild shortness breath with exertion but no significant chest pain, cough or hemoptysis. The patient denied having any significant weight loss or night sweats. She has no fever or chills. She has no significant nausea or vomiting. She is here today to start cycle #4 of her systemic chemotherapy. She has also recently had a restaging CT scan of her chest, abdomen and pelvis and presents to discuss results.  MEDICAL HISTORY: Past Medical History  Diagnosis Date  . Hypertension   . External carotid artery stenosis     L side dx'd with doppler 2012  . UTI (lower urinary tract infection)   . Pneumonia   . Thyroid disease   . Cancer     ALLERGIES:  is allergic to corticosteroids and  minocycline.  MEDICATIONS:  Current Outpatient Prescriptions  Medication Sig Dispense Refill  . aspirin 81 MG tablet Take 81 mg by mouth daily.    [name] dexamethasone (DECADRON) 4 MG tablet 4 mg po bid the day before, day of and day after chemotherapy 40 tablet 1  . doxazosin (CARDURA) 4 MG tablet Take 4 mg by mouth daily.     . folic acid (FOLVITE) 1 MG tablet Take 1 tablet (1 mg total) by mouth daily. 30 tablet 4  . HYDROcodone-acetaminophen (NORCO/VICODIN) 5-325 MG per tablet Take 1 tablet by mouth every 6 (six) hours as needed for moderate pain. 20 tablet 0  . levothyroxine (SYNTHROID, LEVOTHROID) 50 MCG tablet Take 50 mcg by mouth daily before breakfast.    . losartan (COZAAR) 100 MG tablet Take 100 mg by mouth daily.    . potassium chloride SA (K-DUR,KLOR-CON) 20 MEQ tablet Take 1 tablet (20 mEq total) by mouth daily. 7 tablet 0  . prochlorperazine (COMPAZINE) 10 MG tablet Take 1 tablet (10 mg total) by mouth every 6 (six) hours as needed for nausea or vomiting. 30 tablet 0  . verapamil (CALAN-SR) 240 MG CR tablet Take 240 mg by mouth daily.      No current facility-administered medications for this visit.    SURGICAL HISTORY:  Past Surgical History  Procedure Laterality Date  . Video bronchoscopy Bilateral 04/06/2014    Procedure: VIDEO BRONCHOSCOPY WITH FLUORO;  Surgeon: Juanito Doom, MD;  Location: [facility];  Service: Cardiopulmonary;  Laterality: Bilateral;  .  Eye surgery      REVIEW OF SYSTEMS:  Constitutional: positive for fatigue Eyes: negative Ears, nose, mouth, throat, and face: negative Respiratory: positive for dyspnea on exertion Cardiovascular: negative Gastrointestinal: negative Genitourinary:negative Integument/breast: negative Hematologic/lymphatic: negative Musculoskeletal:negative Neurological: negative Behavioral/Psych: negative Endocrine: negative Allergic/Immunologic: negative   PHYSICAL EXAMINATION: General appearance: alert, cooperative,  fatigued and no distress Head: Normocephalic, without obvious abnormality, atraumatic Neck: no adenopathy, no JVD, supple, symmetrical, trachea midline and thyroid not enlarged, symmetric, no tenderness/mass/nodules Lymph nodes: Cervical, supraclavicular, and axillary nodes normal. Resp: rales RLL Back: symmetric, no curvature. ROM normal. No CVA tenderness. Cardio: regular rate and rhythm, S1, S2 normal, no murmur, click, rub or gallop GI: soft, non-tender; bowel sounds normal; no masses,  no organomegaly Extremities: extremities normal, atraumatic, no cyanosis or edema Neurologic: Alert and oriented X 3, normal strength and tone. Normal symmetric reflexes. Normal coordination and gait  ECOG PERFORMANCE STATUS: 1 - Symptomatic but completely ambulatory  Blood pressure 106/41, pulse 69, temperature 97.3 F (36.3 C), temperature source Oral, resp. rate 17, height _0  (1.6 m), weight 143 lb 11.2 oz (65.182 kg), SpO2 95 %.  LABORATORY DATA: Lab Results  Component Value Date   WBC 4.5 02/12/2015   HGB 8.9* 02/12/2015   HCT 26.5* 02/12/2015   MCV 98.7 02/12/2015   PLT 444* 02/12/2015      Chemistry      Component Value Date/Time   NA 138 02/12/2015 1008   NA 134* 01/08/2015 2235   K 4.3 02/12/2015 1008   K 3.6 01/08/2015 2235   CL 102 01/08/2015 2235   CO2 20* 02/12/2015 1008   CO2 21* 01/08/2015 2235   BUN 10.5 02/12/2015 1008   BUN 26* 01/08/2015 2235   CREATININE 1.0 02/12/2015 1008   CREATININE 1.99* 01/08/2015 2235   CREATININE 0.79 09/10/2014 1439      Component Value Date/Time   CALCIUM 9.0 02/12/2015 1008   CALCIUM 8.5* 01/08/2015 2235   ALKPHOS 108 02/12/2015 1008   ALKPHOS 91 09/04/2014 1924   AST 20 02/12/2015 1008   AST 17 09/04/2014 1924   ALT 18 02/12/2015 1008   ALT 11 09/04/2014 1924   BILITOT 0.35 02/12/2015 1008   BILITOT 0.9 09/04/2014 1924       RADIOGRAPHIC STUDIES: Ct Abdomen Pelvis Wo Contrast  02/11/2015   CLINICAL DATA:  81 year old  female presenting for restaging of non-small cell right lung cancer (adenocarcinoma) status post oral chemotherapy. Patient reports chest pain and shortness of breath.  EXAM: CT CHEST, ABDOMEN AND PELVIS WITHOUT CONTRAST  TECHNIQUE: Multidetector CT imaging of the chest, abdomen and pelvis was performed following the standard protocol without IV contrast.  COMPARISON:  11/13/2014 PET-CT.  08/31/2014 diagnostic chest CT.  FINDINGS: CT CHEST FINDINGS  Mediastinum/Nodes: Stable mild cardiomegaly. No pericardial fluid/thickening. There is atherosclerosis of the thoracic aorta, the great vessels of the mediastinum and the coronary arteries, including calcified atherosclerotic plaque in the left main, left anterior descending and left circumflex coronary arteries. Great vessels are normal in course and caliber. Normal visualized thyroid. Normal esophagus. No pathologically enlarged axillary or mediastinal lymph nodes. The hilar nodes are poorly delineated on this noncontrast CT, with no gross hilar nodes detected.  Lungs/Pleura: No pneumothorax. No pleural effusion. There is a persistent 6.8 x 5.2 cm dependent right lower lobe mass (series 4/image 37) with surrounding ground-glass opacity, mildly decreased from 7.6 x 5.5 cm, in keeping with the primary neoplasm. There are at least 5 scattered satellite nodules in the  right lower lobe measuring up to 8 mm, unchanged. There is a new 3 mm subpleural nodule in the anterior left lower lobe (4/31) . There are 3 new scattered tiny left lower lobe pulmonary nodules measuring up to the 3 mm (4/31). There is stable diffuse subpleural reticulation throughout both lungs without appreciable traction bronchiectasis or honeycombing. No new consolidative airspace disease.  Musculoskeletal: Moderate degenerative changes in the thoracic spine. No aggressive appearing focal osseous lesions.  CT ABDOMEN AND PELVIS FINDINGS  Hepatobiliary: Normal liver, with no liver mass. Normal gallbladder,  with no radiopaque cholelithiasis. No biliary ductal dilatation.  Pancreas: Mild diffuse fatty infiltration of the otherwise normal pancreas.  Spleen: Normal.  Adrenals/Urinary Tract: Stable mild irregular thickening of both adrenal glands with no discrete adrenal nodule. Stable hyperdense 0.4 cm renal cyst in the posterior left upper kidney, likely a benign hemorrhagic renal cyst. Otherwise normal kidneys, with no additional contour deforming renal lesions and no hydronephrosis. Normal caliber ureters. Normal urinary bladder.  Stomach/Bowel: Collapsed and grossly normal stomach. Normal caliber small bowel with no small bowel wall thickening. Normal appendix. Marked diffuse colonic diverticulosis, with no colonic wall thickening or pericolonic fat stranding.  Vascular/Lymphatic: Atherosclerotic nonaneurysmal abdominal aorta. No pathologically enlarged lymph nodes in the abdomen or pelvis.  Reproductive: Grossly normal uterus.  No abnormal adnexal mass.  Other: No pneumoperitoneum, ascites or focal fluid collection.  Musculoskeletal: No aggressive appearing focal osseous lesions. Moderate degenerative changes in the lumbar spine.  IMPRESSION: 1. Mildly decreased size of primary right lower lobe lung adenocarcinoma, now 6.8 cm. 2. Stable subcentimeter right lower lobe satellite nodules. 3. Three new tiny left lower lobe pulmonary nodules, largest 3 mm, which may represent pulmonary metastases. 4. No metastatic disease in the abdomen or pelvis. 5. Stable underlying interstitial lung disease characterized by diffuse subpleural reticulation without honeycombing. Differential includes fibrotic phase nonspecific interstitial pneumonia (NSIP) or usual interstitial pneumonia (UIP). 6. Marked colonic diverticulosis.   Electronically Signed   By: Ilona Sorrel M.D.   On: 02/11/2015 13:59   Ct Chest Wo Contrast  02/11/2015   CLINICAL DATA:  81 year old female presenting for restaging of non-small cell right lung cancer  (adenocarcinoma) status post oral chemotherapy. Patient reports chest pain and shortness of breath.  EXAM: CT CHEST, ABDOMEN AND PELVIS WITHOUT CONTRAST  TECHNIQUE: Multidetector CT imaging of the chest, abdomen and pelvis was performed following the standard protocol without IV contrast.  COMPARISON:  11/13/2014 PET-CT.  08/31/2014 diagnostic chest CT.  FINDINGS: CT CHEST FINDINGS  Mediastinum/Nodes: Stable mild cardiomegaly. No pericardial fluid/thickening. There is atherosclerosis of the thoracic aorta, the great vessels of the mediastinum and the coronary arteries, including calcified atherosclerotic plaque in the left main, left anterior descending and left circumflex coronary arteries. Great vessels are normal in course and caliber. Normal visualized thyroid. Normal esophagus. No pathologically enlarged axillary or mediastinal lymph nodes. The hilar nodes are poorly delineated on this noncontrast CT, with no gross hilar nodes detected.  Lungs/Pleura: No pneumothorax. No pleural effusion. There is a persistent 6.8 x 5.2 cm dependent right lower lobe mass (series 4/image 37) with surrounding ground-glass opacity, mildly decreased from 7.6 x 5.5 cm, in keeping with the primary neoplasm. There are at least 5 scattered satellite nodules in the right lower lobe measuring up to 8 mm, unchanged. There is a new 3 mm subpleural nodule in the anterior left lower lobe (4/31) . There are 3 new scattered tiny left lower lobe pulmonary nodules measuring up to the 3 mm (  4/31). There is stable diffuse subpleural reticulation throughout both lungs without appreciable traction bronchiectasis or honeycombing. No new consolidative airspace disease.  Musculoskeletal: Moderate degenerative changes in the thoracic spine. No aggressive appearing focal osseous lesions.  CT ABDOMEN AND PELVIS FINDINGS  Hepatobiliary: Normal liver, with no liver mass. Normal gallbladder, with no radiopaque cholelithiasis. No biliary ductal dilatation.   Pancreas: Mild diffuse fatty infiltration of the otherwise normal pancreas.  Spleen: Normal.  Adrenals/Urinary Tract: Stable mild irregular thickening of both adrenal glands with no discrete adrenal nodule. Stable hyperdense 0.4 cm renal cyst in the posterior left upper kidney, likely a benign hemorrhagic renal cyst. Otherwise normal kidneys, with no additional contour deforming renal lesions and no hydronephrosis. Normal caliber ureters. Normal urinary bladder.  Stomach/Bowel: Collapsed and grossly normal stomach. Normal caliber small bowel with no small bowel wall thickening. Normal appendix. Marked diffuse colonic diverticulosis, with no colonic wall thickening or pericolonic fat stranding.  Vascular/Lymphatic: Atherosclerotic nonaneurysmal abdominal aorta. No pathologically enlarged lymph nodes in the abdomen or pelvis.  Reproductive: Grossly normal uterus.  No abnormal adnexal mass.  Other: No pneumoperitoneum, ascites or focal fluid collection.  Musculoskeletal: No aggressive appearing focal osseous lesions. Moderate degenerative changes in the lumbar spine.  IMPRESSION: 1. Mildly decreased size of primary right lower lobe lung adenocarcinoma, now 6.8 cm. 2. Stable subcentimeter right lower lobe satellite nodules. 3. Three new tiny left lower lobe pulmonary nodules, largest 3 mm, which may represent pulmonary metastases. 4. No metastatic disease in the abdomen or pelvis. 5. Stable underlying interstitial lung disease characterized by diffuse subpleural reticulation without honeycombing. Differential includes fibrotic phase nonspecific interstitial pneumonia (NSIP) or usual interstitial pneumonia (UIP). 6. Marked colonic diverticulosis.   Electronically Signed   By: Ilona Sorrel M.D.   On: 02/11/2015 13:59    ASSESSMENT AND PLAN: This is a very pleasant 81 years old white female with stage IIIA/B non-small cell lung cancer, adenocarcinoma involving the right lower lobe with hilar lymphadenopathy as well as  several satellite lesions. The molecular study showed no evidence for target mutations. She is currently on treatment with carboplatin and Alimta status post 3 cycles and has been to rating her treatment fairly well. Her Alimta was reduced to 40 mg region squared due to her history of renal insufficiency with elevated serum creatinine. Her current serum creatinine is 1.0. The patient was discussed with and also seen by Dr. Julien Nordmann. Her recent restaging CT scan revealed slight decrease in her primary right lower lobe lung mass and overall relatively stable disease. These results were reviewed with the patient. She will continue on dose reduced systemic chemotherapy with carboplatin and Alimta, cycle #4 today. She will continue with weekly labs as previously seeing scheduled. She will return in 3 weeks prior to the start of cycle #5. She was advised to call immediately if she has any concerning symptoms in the interval. The patient voices understanding of current disease status and treatment options and is in agreement with the current care plan.  All questions were answered. The patient knows to call the clinic with any problems, questions or concerns. We can certainly see the patient much sooner if necessary.  Carlton Adam PA-C   ADDENDUM: Hematology/Oncology Attending: I had a face to face encounter with the patient. I recommended her care plan. This is a very pleasant 81 years old white female with a stage IIIB non-small cell lung cancer, adenocarcinoma currently undergoing systemic chemotherapy was carboplatin and Alimta status post 3 cycles. She is  tolerating her treatment well with no significant adverse effects. Her last dose of Alimta was reduced to 400 MG/M2 secondary to renal insufficiency and the patient tolerated her treatment much better. The recent CT scan of the chest, abdomen and pelvis showed decrease in the primary right lower lobe lung mass but there was a very stable disease  except for 3 tiny new pulmonary nodules around 3 mm each. I discussed the scan results and showed the images to the patient and her family. I recommended for her to continue with her current systemic chemotherapy with carboplatin for AUC of 4 and Alimta 400 MG/M2. She will proceed with cycle #4 today. The patient would come back for follow-up visit in 3 weeks for reevaluation before starting cycle #5. The patient was advised to call immediately if she has any concerning symptoms in the interval.  Disclaimer: This note was dictated with voice recognition software. Similar sounding words can inadvertently be transcribed and may not be corrected upon review. Eilleen Kempf., MD 02/17/2015

## 2015-02-15 NOTE — Patient Instructions (Signed)
Your recent restaging CT scan revealed slight decrease in your right lower lobe lung mass and overall stable disease Continue weekly labs as scheduled follow-up in 3 weeks prior to next scheduled cycle of chemotherapy

## 2015-02-17 DIAGNOSIS — Z5111 Encounter for antineoplastic chemotherapy: Secondary | ICD-10-CM | POA: Insufficient documentation

## 2015-02-19 ENCOUNTER — Other Ambulatory Visit (HOSPITAL_BASED_OUTPATIENT_CLINIC_OR_DEPARTMENT_OTHER): Payer: Medicare Other

## 2015-02-19 DIAGNOSIS — C3431 Malignant neoplasm of lower lobe, right bronchus or lung: Secondary | ICD-10-CM | POA: Diagnosis not present

## 2015-02-19 DIAGNOSIS — C3491 Malignant neoplasm of unspecified part of right bronchus or lung: Secondary | ICD-10-CM

## 2015-02-19 LAB — CBC WITH DIFFERENTIAL/PLATELET
BASO%: 0.7 % (ref 0.0–2.0)
BASOS ABS: 0 10*3/uL (ref 0.0–0.1)
EOS ABS: 0 10*3/uL (ref 0.0–0.5)
EOS%: 1.6 % (ref 0.0–7.0)
HCT: 25.1 % — ABNORMAL LOW (ref 34.8–46.6)
HGB: 8.7 g/dL — ABNORMAL LOW (ref 11.6–15.9)
LYMPH%: 36.3 % (ref 14.0–49.7)
MCH: 34.3 pg — AB (ref 25.1–34.0)
MCHC: 34.5 g/dL (ref 31.5–36.0)
MCV: 99.2 fL (ref 79.5–101.0)
MONO#: 0.1 10*3/uL (ref 0.1–0.9)
MONO%: 7.1 % (ref 0.0–14.0)
NEUT#: 1.1 10*3/uL — ABNORMAL LOW (ref 1.5–6.5)
NEUT%: 54.3 % (ref 38.4–76.8)
Platelets: 269 10*3/uL (ref 145–400)
RBC: 2.53 10*6/uL — AB (ref 3.70–5.45)
RDW: 18 % — ABNORMAL HIGH (ref 11.2–14.5)
WBC: 2.1 10*3/uL — ABNORMAL LOW (ref 3.9–10.3)
lymph#: 0.7 10*3/uL — ABNORMAL LOW (ref 0.9–3.3)

## 2015-02-19 LAB — COMPREHENSIVE METABOLIC PANEL (CC13)
ALK PHOS: 101 U/L (ref 40–150)
ALT: 25 U/L (ref 0–55)
AST: 19 U/L (ref 5–34)
Albumin: 3.1 g/dL — ABNORMAL LOW (ref 3.5–5.0)
Anion Gap: 12 mEq/L — ABNORMAL HIGH (ref 3–11)
BUN: 18 mg/dL (ref 7.0–26.0)
CO2: 20 meq/L — AB (ref 22–29)
Calcium: 8.7 mg/dL (ref 8.4–10.4)
Chloride: 105 mEq/L (ref 98–109)
Creatinine: 1.1 mg/dL (ref 0.6–1.1)
EGFR: 45 mL/min/{1.73_m2} — AB (ref >90)
GLUCOSE: 128 mg/dL (ref 70–140)
Potassium: 3.5 mEq/L (ref 3.5–5.1)
SODIUM: 137 meq/L (ref 136–145)
Total Bilirubin: 0.75 mg/dL (ref 0.20–1.20)
Total Protein: 6.2 g/dL — ABNORMAL LOW (ref 6.4–8.3)

## 2015-02-25 ENCOUNTER — Telehealth: Payer: Self-pay | Admitting: *Deleted

## 2015-02-25 NOTE — Telephone Encounter (Signed)
PT. IS ON THREE BLOOD PRESSURE MEDICATIONS. SHIRLEY CATES,NP INSTRUCTED PT. NOT TO TAKE HER DOXAZOSIN TONIGHT AND RECHECK BLOOD PRESSURE. PT. WAS CALLED BY THIS NURSE. PT. IS FATIGUED AND SOMETIMES GETS SHORT OF BREATH WITH EXERTION. SHE IS NOT DIZZY. PT. IS DRINKING 50 OUNCES OF FLUIDS A DAY. ENCOURAGED HER TO FORCE FLUIDS TO 64 OUNCES A DAY. SHE IS EATING. PT. HAS NOT HAD ANY NAUSEA, VOMITING, OR DIARRHEA. HER WEIGHT IS UP ONE POUND TO 146. SHE WILL TRY TO GET A BLOOD PRESSURE CUFF FROM HER SON SO SHE CAN KEEP A CHECK ON HER BLOOD PRESSURE. INSTRUCTED PT. TO CALL HER PRIMARY CARE PHYSICIAN, DR. GREEN, ABOUT HER BLOOD PRESSURE IN THE MORNING. ALSO DIRECTED PT. TO GO TO THE EMERGENCY DEPARTMENT IF SHE BECOMES MORE SHORT OF BREATH, FEELS LIKE SHE MAY PASS OUT OR HER CONDITION WORSEN. PT. VOICES UNDERSTANDING.

## 2015-03-04 ENCOUNTER — Ambulatory Visit (HOSPITAL_BASED_OUTPATIENT_CLINIC_OR_DEPARTMENT_OTHER): Payer: Medicare Other | Admitting: Nurse Practitioner

## 2015-03-04 ENCOUNTER — Ambulatory Visit: Payer: Medicare Other

## 2015-03-04 ENCOUNTER — Telehealth: Payer: Self-pay | Admitting: Nurse Practitioner

## 2015-03-04 ENCOUNTER — Encounter: Payer: Self-pay | Admitting: Nurse Practitioner

## 2015-03-04 ENCOUNTER — Other Ambulatory Visit (HOSPITAL_BASED_OUTPATIENT_CLINIC_OR_DEPARTMENT_OTHER): Payer: Medicare Other

## 2015-03-04 VITALS — BP 100/48 | HR 68 | Temp 97.6°F | Resp 16 | Ht 63.0 in | Wt 142.3 lb

## 2015-03-04 DIAGNOSIS — C3431 Malignant neoplasm of lower lobe, right bronchus or lung: Secondary | ICD-10-CM

## 2015-03-04 DIAGNOSIS — T451X5A Adverse effect of antineoplastic and immunosuppressive drugs, initial encounter: Secondary | ICD-10-CM

## 2015-03-04 DIAGNOSIS — R748 Abnormal levels of other serum enzymes: Secondary | ICD-10-CM

## 2015-03-04 DIAGNOSIS — R197 Diarrhea, unspecified: Secondary | ICD-10-CM

## 2015-03-04 DIAGNOSIS — N289 Disorder of kidney and ureter, unspecified: Secondary | ICD-10-CM

## 2015-03-04 DIAGNOSIS — C3491 Malignant neoplasm of unspecified part of right bronchus or lung: Secondary | ICD-10-CM

## 2015-03-04 DIAGNOSIS — K521 Toxic gastroenteritis and colitis: Secondary | ICD-10-CM

## 2015-03-04 DIAGNOSIS — I952 Hypotension due to drugs: Secondary | ICD-10-CM

## 2015-03-04 DIAGNOSIS — I10 Essential (primary) hypertension: Secondary | ICD-10-CM

## 2015-03-04 LAB — CBC WITH DIFFERENTIAL/PLATELET
BASO%: 0.3 % (ref 0.0–2.0)
Basophils Absolute: 0 10*3/uL (ref 0.0–0.1)
EOS%: 1.6 % (ref 0.0–7.0)
Eosinophils Absolute: 0.1 10*3/uL (ref 0.0–0.5)
HCT: 25.5 % — ABNORMAL LOW (ref 34.8–46.6)
HGB: 8.6 g/dL — ABNORMAL LOW (ref 11.6–15.9)
LYMPH%: 28.9 % (ref 14.0–49.7)
MCH: 34.8 pg — ABNORMAL HIGH (ref 25.1–34.0)
MCHC: 33.9 g/dL (ref 31.5–36.0)
MCV: 102.9 fL — ABNORMAL HIGH (ref 79.5–101.0)
MONO#: 0.9 10*3/uL (ref 0.1–0.9)
MONO%: 27.6 % — AB (ref 0.0–14.0)
NEUT%: 41.6 % (ref 38.4–76.8)
NEUTROS ABS: 1.3 10*3/uL — AB (ref 1.5–6.5)
Platelets: 161 10*3/uL (ref 145–400)
RBC: 2.48 10*6/uL — AB (ref 3.70–5.45)
RDW: 20.9 % — ABNORMAL HIGH (ref 11.2–14.5)
WBC: 3.2 10*3/uL — ABNORMAL LOW (ref 3.9–10.3)
lymph#: 0.9 10*3/uL (ref 0.9–3.3)

## 2015-03-04 LAB — COMPREHENSIVE METABOLIC PANEL (CC13)
ALT: 14 U/L (ref 0–55)
AST: 20 U/L (ref 5–34)
Albumin: 3.4 g/dL — ABNORMAL LOW (ref 3.5–5.0)
Alkaline Phosphatase: 107 U/L (ref 40–150)
Anion Gap: 11 mEq/L (ref 3–11)
BILIRUBIN TOTAL: 0.54 mg/dL (ref 0.20–1.20)
BUN: 14.9 mg/dL (ref 7.0–26.0)
CHLORIDE: 106 meq/L (ref 98–109)
CO2: 21 mEq/L — ABNORMAL LOW (ref 22–29)
CREATININE: 1.1 mg/dL (ref 0.6–1.1)
Calcium: 8.8 mg/dL (ref 8.4–10.4)
EGFR: 45 mL/min/{1.73_m2} — ABNORMAL LOW (ref >90)
GLUCOSE: 116 mg/dL (ref 70–140)
Potassium: 3.5 mEq/L (ref 3.5–5.1)
Sodium: 138 mEq/L (ref 136–145)
TOTAL PROTEIN: 6.4 g/dL (ref 6.4–8.3)

## 2015-03-04 NOTE — Telephone Encounter (Signed)
Appointments made and avs pritned for patient °

## 2015-03-04 NOTE — Progress Notes (Signed)
[facility] [phone]   [phone]  OFFICE PROGRESS NOTE  GREEN, [name] Bachelor, MD [address], [address] [place] [address]  DIAGNOSIS: Stage IIIA (T3, N1, M0) non-small cell lung cancer, adenocarcinoma with negative EGFR mutation and negative for gene translocation diagnosed in May 2016 in a patient with remote history of smoking. She has multiple satellite nodules that would place her at a higher stage.  GU[facility] 360 Molecular study: positive for BRCA2, NOTCH1, RAF1 and NF![name]  PRIOR THERAPY: None  CURRENT THERAPY: Systemic chemotherapy with carboplatin for an AUC of 5 and Alimta at 500 mg/m given every 3 weeks. Status post 4 cycles.  INTERVAL HISTORY: [name] Carlson 81 y.o. female returns to the clinic today for follow-up visit, accompanied by her daughter. The patient is very fatigued and lightheaded today. Her blood pressures have been running low for the past 3 weeks, but she never contacted her PCP like she was asked. She has shortness of breath with exertion, but denies cough, chest pain, or hemoptysis. She has diarrhea, but takes nothing to manage it. She is likely not eating or drinking enough as she reports a lack of appetite. She denies fevers, chills, nausea, or vomiting. Her daughter is concerned because the patient has stopped bathing and brushing her teeth. The patient does not have enough energy/motivation to do this most days, and she does not like the texture of her current tooth paste.   MEDICAL HISTORY: Past Medical History  Diagnosis Date  . Hypertension   . External carotid artery stenosis     L side dx'd with doppler 2012  . UTI (lower urinary tract infection)   . Pneumonia   . Thyroid disease   . Cancer     ALLERGIES:  is allergic to corticosteroids and minocycline.  MEDICATIONS:  Current Outpatient Prescriptions  Medication Sig Dispense Refill  . aspirin 81 MG tablet Take 81 mg by mouth daily.    [name]  dexamethasone (DECADRON) 4 MG tablet 4 mg po bid the day before, day of and day after chemotherapy 40 tablet 1  . doxazosin (CARDURA) 4 MG tablet Take 4 mg by mouth daily.     . folic acid (FOLVITE) 1 MG tablet Take 1 tablet (1 mg total) by mouth daily. 30 tablet 4  . levothyroxine (SYNTHROID, LEVOTHROID) 50 MCG tablet Take 50 mcg by mouth daily before breakfast.    . losartan (COZAAR) 100 MG tablet Take 100 mg by mouth daily.    . potassium chloride SA (K-DUR,KLOR-CON) 20 MEQ tablet Take 1 tablet (20 mEq total) by mouth daily. 7 tablet 0  . verapamil (CALAN-SR) 240 MG CR tablet Take 240 mg by mouth daily.     [name] HYDROcodone-acetaminophen (NORCO/VICODIN) 5-325 MG per tablet Take 1 tablet by mouth every 6 (six) hours as needed for moderate pain. (Patient not taking: Reported on 03/04/2015) 20 tablet 0  . prochlorperazine (COMPAZINE) 10 MG tablet Take 1 tablet (10 mg total) by mouth every 6 (six) hours as needed for nausea or vomiting. (Patient not taking: Reported on 03/04/2015) 30 tablet 0   No current facility-administered medications for this visit.    SURGICAL HISTORY:  Past Surgical History  Procedure Laterality Date  . Video bronchoscopy Bilateral 04/06/2014    Procedure: VIDEO BRONCHOSCOPY WITH FLUORO;  Surgeon: [name] Doom, MD;  Location: [facility];  Service: Cardiopulmonary;  Laterality: Bilateral;  . Eye surgery      REVIEW OF SYSTEMS:  Constitutional: positive for fatigue Eyes: negative Ears, nose, mouth, throat, and face: negative Respiratory: positive for dyspnea on exertion Cardiovascular: negative Gastrointestinal: negative Genitourinary:negative Integument/breast: negative Hematologic/lymphatic: negative Musculoskeletal:negative Neurological: negative Behavioral/Psych: negative Endocrine: negative Allergic/Immunologic: negative   PHYSICAL EXAMINATION: General appearance: alert, cooperative, fatigued and no distress Head: Normocephalic, without obvious  abnormality, atraumatic Neck: no adenopathy, no JVD, supple, symmetrical, trachea midline and thyroid not enlarged, symmetric, no tenderness/mass/nodules Lymph nodes: Cervical, supraclavicular, and axillary nodes normal. Resp: rales RLL Back: symmetric, no curvature. ROM normal. No CVA tenderness. Cardio: regular rate and rhythm, S1, S2 normal, no murmur, click, rub or gallop GI: soft, non-tender; bowel sounds normal; no masses,  no organomegaly Extremities: extremities normal, atraumatic, no cyanosis or edema Neurologic: Alert and oriented X 3, normal strength and tone. Normal symmetric reflexes. Normal coordination and gait  ECOG PERFORMANCE STATUS: 1 - Symptomatic but completely ambulatory  Blood pressure 100/48, pulse 68, temperature 97.6 F (36.4 C), temperature source Oral, resp. rate 16, height '5\' 3"'  (1.6 m), weight 142 lb 4.8 oz (64.547 kg), SpO2 95 %.  LABORATORY DATA: Lab Results  Component Value Date   WBC 3.2* 03/04/2015   HGB 8.6* 03/04/2015   HCT 25.5* 03/04/2015   MCV 102.9* 03/04/2015   PLT 161 03/04/2015      Chemistry      Component Value Date/Time   NA 138 03/04/2015 0834   NA 134* 01/08/2015 2235   K 3.5 03/04/2015 0834   K 3.6 01/08/2015 2235   CL 102 01/08/2015 2235   CO2 21* 03/04/2015 0834   CO2 21* 01/08/2015 2235   BUN 14.9 03/04/2015 0834   BUN 26* 01/08/2015 2235   CREATININE 1.1 03/04/2015 0834   CREATININE 1.99* 01/08/2015 2235   CREATININE 0.79 09/10/2014 1439      Component Value Date/Time   CALCIUM 8.8 03/04/2015 0834   CALCIUM 8.5* 01/08/2015 2235   ALKPHOS 107 03/04/2015 0834   ALKPHOS 91 09/04/2014 1924   AST 20 03/04/2015 0834   AST 17 09/04/2014 1924   ALT 14 03/04/2015 0834   ALT 11 09/04/2014 1924   BILITOT 0.54 03/04/2015 0834   BILITOT 0.9 09/04/2014 1924       RADIOGRAPHIC STUDIES: Ct Abdomen Pelvis Wo Contrast  02/11/2015   CLINICAL DATA:  81 year old female presenting for restaging of non-small cell right lung  cancer (adenocarcinoma) status post oral chemotherapy. Patient reports chest pain and shortness of breath.  EXAM: CT CHEST, ABDOMEN AND PELVIS WITHOUT CONTRAST  TECHNIQUE: Multidetector CT imaging of the chest, abdomen and pelvis was performed following the standard protocol without IV contrast.  COMPARISON:  11/13/2014 PET-CT.  08/31/2014 diagnostic chest CT.  FINDINGS: CT CHEST FINDINGS  Mediastinum/Nodes: Stable mild cardiomegaly. No pericardial fluid/thickening. There is atherosclerosis of the thoracic aorta, the great vessels of the mediastinum and the coronary arteries, including calcified atherosclerotic plaque in the left main, left anterior descending and left circumflex coronary arteries. Great vessels are normal in course and caliber. Normal visualized thyroid. Normal esophagus. No pathologically enlarged axillary or mediastinal lymph nodes. The hilar nodes are poorly delineated on this noncontrast CT, with no gross hilar nodes detected.  Lungs/Pleura: No pneumothorax. No pleural effusion. There is a persistent 6.8 x 5.2 cm dependent right lower lobe mass (series 4/image 37) with surrounding ground-glass opacity, mildly decreased from 7.6 x 5.5 cm, in keeping with the primary neoplasm. There are at least 5 scattered satellite nodules in the right lower lobe measuring up to 8 mm, unchanged. There is  a new 3 mm subpleural nodule in the anterior left lower lobe (4/31) . There are 3 new scattered tiny left lower lobe pulmonary nodules measuring up to the 3 mm (4/31). There is stable diffuse subpleural reticulation throughout both lungs without appreciable traction bronchiectasis or honeycombing. No new consolidative airspace disease.  Musculoskeletal: Moderate degenerative changes in the thoracic spine. No aggressive appearing focal osseous lesions.  CT ABDOMEN AND PELVIS FINDINGS  Hepatobiliary: Normal liver, with no liver mass. Normal gallbladder, with no radiopaque cholelithiasis. No biliary ductal  dilatation.  Pancreas: Mild diffuse fatty infiltration of the otherwise normal pancreas.  Spleen: Normal.  Adrenals/Urinary Tract: Stable mild irregular thickening of both adrenal glands with no discrete adrenal nodule. Stable hyperdense 0.4 cm renal cyst in the posterior left upper kidney, likely a benign hemorrhagic renal cyst. Otherwise normal kidneys, with no additional contour deforming renal lesions and no hydronephrosis. Normal caliber ureters. Normal urinary bladder.  Stomach/Bowel: Collapsed and grossly normal stomach. Normal caliber small bowel with no small bowel wall thickening. Normal appendix. Marked diffuse colonic diverticulosis, with no colonic wall thickening or pericolonic fat stranding.  Vascular/Lymphatic: Atherosclerotic nonaneurysmal abdominal aorta. No pathologically enlarged lymph nodes in the abdomen or pelvis.  Reproductive: Grossly normal uterus.  No abnormal adnexal mass.  Other: No pneumoperitoneum, ascites or focal fluid collection.  Musculoskeletal: No aggressive appearing focal osseous lesions. Moderate degenerative changes in the lumbar spine.  IMPRESSION: 1. Mildly decreased size of primary right lower lobe lung adenocarcinoma, now 6.8 cm. 2. Stable subcentimeter right lower lobe satellite nodules. 3. Three new tiny left lower lobe pulmonary nodules, largest 3 mm, which may represent pulmonary metastases. 4. No metastatic disease in the abdomen or pelvis. 5. Stable underlying interstitial lung disease characterized by diffuse subpleural reticulation without honeycombing. Differential includes fibrotic phase nonspecific interstitial pneumonia (NSIP) or usual interstitial pneumonia (UIP). 6. Marked colonic diverticulosis.   Electronically Signed   By: Ilona Sorrel M.D.   On: 02/11/2015 13:59   Ct Chest Wo Contrast  02/11/2015   CLINICAL DATA:  81 year old female presenting for restaging of non-small cell right lung cancer (adenocarcinoma) status post oral chemotherapy. Patient  reports chest pain and shortness of breath.  EXAM: CT CHEST, ABDOMEN AND PELVIS WITHOUT CONTRAST  TECHNIQUE: Multidetector CT imaging of the chest, abdomen and pelvis was performed following the standard protocol without IV contrast.  COMPARISON:  11/13/2014 PET-CT.  08/31/2014 diagnostic chest CT.  FINDINGS: CT CHEST FINDINGS  Mediastinum/Nodes: Stable mild cardiomegaly. No pericardial fluid/thickening. There is atherosclerosis of the thoracic aorta, the great vessels of the mediastinum and the coronary arteries, including calcified atherosclerotic plaque in the left main, left anterior descending and left circumflex coronary arteries. Great vessels are normal in course and caliber. Normal visualized thyroid. Normal esophagus. No pathologically enlarged axillary or mediastinal lymph nodes. The hilar nodes are poorly delineated on this noncontrast CT, with no gross hilar nodes detected.  Lungs/Pleura: No pneumothorax. No pleural effusion. There is a persistent 6.8 x 5.2 cm dependent right lower lobe mass (series 4/image 37) with surrounding ground-glass opacity, mildly decreased from 7.6 x 5.5 cm, in keeping with the primary neoplasm. There are at least 5 scattered satellite nodules in the right lower lobe measuring up to 8 mm, unchanged. There is a new 3 mm subpleural nodule in the anterior left lower lobe (4/31) . There are 3 new scattered tiny left lower lobe pulmonary nodules measuring up to the 3 mm (4/31). There is stable diffuse subpleural reticulation throughout both lungs without  appreciable traction bronchiectasis or honeycombing. No new consolidative airspace disease.  Musculoskeletal: Moderate degenerative changes in the thoracic spine. No aggressive appearing focal osseous lesions.  CT ABDOMEN AND PELVIS FINDINGS  Hepatobiliary: Normal liver, with no liver mass. Normal gallbladder, with no radiopaque cholelithiasis. No biliary ductal dilatation.  Pancreas: Mild diffuse fatty infiltration of the  otherwise normal pancreas.  Spleen: Normal.  Adrenals/Urinary Tract: Stable mild irregular thickening of both adrenal glands with no discrete adrenal nodule. Stable hyperdense 0.4 cm renal cyst in the posterior left upper kidney, likely a benign hemorrhagic renal cyst. Otherwise normal kidneys, with no additional contour deforming renal lesions and no hydronephrosis. Normal caliber ureters. Normal urinary bladder.  Stomach/Bowel: Collapsed and grossly normal stomach. Normal caliber small bowel with no small bowel wall thickening. Normal appendix. Marked diffuse colonic diverticulosis, with no colonic wall thickening or pericolonic fat stranding.  Vascular/Lymphatic: Atherosclerotic nonaneurysmal abdominal aorta. No pathologically enlarged lymph nodes in the abdomen or pelvis.  Reproductive: Grossly normal uterus.  No abnormal adnexal mass.  Other: No pneumoperitoneum, ascites or focal fluid collection.  Musculoskeletal: No aggressive appearing focal osseous lesions. Moderate degenerative changes in the lumbar spine.  IMPRESSION: 1. Mildly decreased size of primary right lower lobe lung adenocarcinoma, now 6.8 cm. 2. Stable subcentimeter right lower lobe satellite nodules. 3. Three new tiny left lower lobe pulmonary nodules, largest 3 mm, which may represent pulmonary metastases. 4. No metastatic disease in the abdomen or pelvis. 5. Stable underlying interstitial lung disease characterized by diffuse subpleural reticulation without honeycombing. Differential includes fibrotic phase nonspecific interstitial pneumonia (NSIP) or usual interstitial pneumonia (UIP). 6. Marked colonic diverticulosis.   Electronically Signed   By: Ilona Sorrel M.D.   On: 02/11/2015 13:59    ASSESSMENT AND PLAN: This is a very pleasant 81 years old white female with stage IIIA/B non-small cell lung cancer, adenocarcinoma involving the right lower lobe with hilar lymphadenopathy as well as several satellite lesions. The molecular study  showed no evidence for target mutations. She is currently on treatment with carboplatin and Alimta status post 4 cycles. Her Alimta was reduced to 40 mg region squared due to her history of renal insufficiency with elevated serum creatinine. Her current serum creatinine is 1.1. The patient was discussed with and also seen by Dr. Julien Nordmann. Due to her decline in physical status, the patient would like to have additional time off before beginning cycle 4 of treatment. In this week off, she will push IV fluids and protein. She has also been heavily encouraged to follow up with her PCP regarding her blood pressure management.  For now she will continue to hold her evening blood pressure medicines.  She will use imodium PRN for diarrhea We also discussed personal hygiene. She will shower every other day, and buy a few different travel sized toothpastes until she finds one that she can tolerate.  The patient will return in 1 week for cycle #5.  She will have weekly labs, and return for a follow up visit on 10/17 prior to cycle #6.   She was advised to call immediately if she has any concerning symptoms in the interval. The patient voices understanding of current disease status and treatment options and is in agreement with the current care plan.  All questions were answered. The patient knows to call the clinic with any problems, questions or concerns. We can certainly see the patient much sooner if necessary.  03/04/2015  ADDENDUM: Hematology/Oncology Attending: I had a face to face encounter with  the patient today. I recommended her care plan. This is a very pleasant 81 years old white female with a stage IIIa non-small cell lung cancer currently undergoing systemic chemotherapy was carboplatin and Alimta status post 4 cycles. The patient has been tolerating her treatment fairly well except for increasing fatigue. She also has some issues with controlling her blood pressure and she has been suffering  from low blood pressure recently secondary to multiple antihypertensives medication. I strongly recommended for the patient to consult with her primary care physician regarding adjustment her blood pressure medicine. For the fatigue I had a lengthy discussion with the patient and her daughter about continuing systemic chemotherapy was carboplatin and Alimta versus stopping the treatment at this point and continuous observation with repeat imaging studies. The patient would like to delay her treatment by one week to see if she would feel better before resuming cycle #5. She will proceed with cycle #5 next week she would come back for follow-up visit in 4 weeks for reevaluation before starting cycle #6. She was advised to call immediately if she has any concerning symptoms in the interval.  Disclaimer: This note was dictated with voice recognition software. Similar sounding words can inadvertently be transcribed and may be missed upon review. Eilleen Kempf., MD 03/04/2015

## 2015-03-11 ENCOUNTER — Other Ambulatory Visit: Payer: Self-pay | Admitting: Internal Medicine

## 2015-03-11 ENCOUNTER — Other Ambulatory Visit (HOSPITAL_BASED_OUTPATIENT_CLINIC_OR_DEPARTMENT_OTHER): Payer: Medicare Other

## 2015-03-11 ENCOUNTER — Ambulatory Visit (HOSPITAL_BASED_OUTPATIENT_CLINIC_OR_DEPARTMENT_OTHER): Payer: Medicare Other

## 2015-03-11 VITALS — BP 118/44 | HR 74 | Temp 97.8°F | Resp 18

## 2015-03-11 DIAGNOSIS — Z5111 Encounter for antineoplastic chemotherapy: Secondary | ICD-10-CM

## 2015-03-11 DIAGNOSIS — C3431 Malignant neoplasm of lower lobe, right bronchus or lung: Secondary | ICD-10-CM

## 2015-03-11 DIAGNOSIS — C3491 Malignant neoplasm of unspecified part of right bronchus or lung: Secondary | ICD-10-CM

## 2015-03-11 LAB — COMPREHENSIVE METABOLIC PANEL (CC13)
ALT: 10 U/L (ref 0–55)
ANION GAP: 11 meq/L (ref 3–11)
AST: 20 U/L (ref 5–34)
Albumin: 3.2 g/dL — ABNORMAL LOW (ref 3.5–5.0)
Alkaline Phosphatase: 95 U/L (ref 40–150)
BUN: 13 mg/dL (ref 7.0–26.0)
CALCIUM: 8.9 mg/dL (ref 8.4–10.4)
CO2: 22 mEq/L (ref 22–29)
CREATININE: 1 mg/dL (ref 0.6–1.1)
Chloride: 106 mEq/L (ref 98–109)
EGFR: 50 mL/min/{1.73_m2} — ABNORMAL LOW (ref >90)
Glucose: 120 mg/dl (ref 70–140)
Potassium: 3.9 mEq/L (ref 3.5–5.1)
Sodium: 139 mEq/L (ref 136–145)
Total Bilirubin: 0.43 mg/dL (ref 0.20–1.20)
Total Protein: 6 g/dL — ABNORMAL LOW (ref 6.4–8.3)

## 2015-03-11 LAB — CBC WITH DIFFERENTIAL/PLATELET
BASO%: 0.4 % (ref 0.0–2.0)
Basophils Absolute: 0 10*3/uL (ref 0.0–0.1)
EOS%: 0.6 % (ref 0.0–7.0)
Eosinophils Absolute: 0 10*3/uL (ref 0.0–0.5)
HEMATOCRIT: 25.5 % — AB (ref 34.8–46.6)
HEMOGLOBIN: 8.4 g/dL — AB (ref 11.6–15.9)
LYMPH#: 0.7 10*3/uL — AB (ref 0.9–3.3)
LYMPH%: 15.3 % (ref 14.0–49.7)
MCH: 34.6 pg — ABNORMAL HIGH (ref 25.1–34.0)
MCHC: 33.1 g/dL (ref 31.5–36.0)
MCV: 104.7 fL — ABNORMAL HIGH (ref 79.5–101.0)
MONO#: 0.8 10*3/uL (ref 0.1–0.9)
MONO%: 18.9 % — ABNORMAL HIGH (ref 0.0–14.0)
NEUT#: 2.9 10*3/uL (ref 1.5–6.5)
NEUT%: 64.8 % (ref 38.4–76.8)
PLATELETS: 247 10*3/uL (ref 145–400)
RBC: 2.44 10*6/uL — ABNORMAL LOW (ref 3.70–5.45)
RDW: 20.7 % — AB (ref 11.2–14.5)
WBC: 4.5 10*3/uL (ref 3.9–10.3)

## 2015-03-11 MED ORDER — SODIUM CHLORIDE 0.9 % IV SOLN
400.0000 mg/m2 | Freq: Once | INTRAVENOUS | Status: AC
  Administered 2015-03-11: 700 mg via INTRAVENOUS
  Filled 2015-03-11: qty 28

## 2015-03-11 MED ORDER — SODIUM CHLORIDE 0.9 % IV SOLN
Freq: Once | INTRAVENOUS | Status: AC
  Administered 2015-03-11: 15:00:00 via INTRAVENOUS
  Filled 2015-03-11: qty 8

## 2015-03-11 MED ORDER — SODIUM CHLORIDE 0.9 % IV SOLN
Freq: Once | INTRAVENOUS | Status: AC
  Administered 2015-03-11: 15:00:00 via INTRAVENOUS

## 2015-03-11 MED ORDER — SODIUM CHLORIDE 0.9 % IV SOLN
260.0000 mg | Freq: Once | INTRAVENOUS | Status: AC
  Administered 2015-03-11: 260 mg via INTRAVENOUS
  Filled 2015-03-11: qty 26

## 2015-03-11 NOTE — Patient Instructions (Signed)
[facility] Discharge Instructions for Patients Receiving Chemotherapy  Today you received the following chemotherapy agents: Alimta and Carboplatin.  To help prevent nausea and vomiting after your treatment, we encourage you to take your nausea medication as directed.   If you develop nausea and vomiting that is not controlled by your nausea medication, call the clinic.   BELOW ARE SYMPTOMS THAT SHOULD BE REPORTED IMMEDIATELY:  *FEVER GREATER THAN 100.5 F  *CHILLS WITH OR WITHOUT FEVER  NAUSEA AND VOMITING THAT IS NOT CONTROLLED WITH YOUR NAUSEA MEDICATION  *UNUSUAL SHORTNESS OF BREATH  *UNUSUAL BRUISING OR BLEEDING  TENDERNESS IN MOUTH AND THROAT WITH OR WITHOUT PRESENCE OF ULCERS  *URINARY PROBLEMS  *BOWEL PROBLEMS  UNUSUAL RASH Items with * indicate a potential emergency and should be followed up as soon as possible.  Feel free to call the clinic you have any questions or concerns. The clinic phone number is [phone].  Please show the CHEMO ALERT CARD at check-in to the Emergency Department and triage nurse.   

## 2015-03-18 ENCOUNTER — Other Ambulatory Visit: Payer: Self-pay

## 2015-03-18 ENCOUNTER — Encounter: Payer: Self-pay | Admitting: Nurse Practitioner

## 2015-03-18 ENCOUNTER — Ambulatory Visit (HOSPITAL_BASED_OUTPATIENT_CLINIC_OR_DEPARTMENT_OTHER): Payer: Self-pay | Admitting: Nurse Practitioner

## 2015-03-18 ENCOUNTER — Other Ambulatory Visit (HOSPITAL_BASED_OUTPATIENT_CLINIC_OR_DEPARTMENT_OTHER): Payer: Medicare Other

## 2015-03-18 ENCOUNTER — Telehealth: Payer: Self-pay

## 2015-03-18 DIAGNOSIS — N289 Disorder of kidney and ureter, unspecified: Secondary | ICD-10-CM

## 2015-03-18 DIAGNOSIS — C3431 Malignant neoplasm of lower lobe, right bronchus or lung: Secondary | ICD-10-CM

## 2015-03-18 DIAGNOSIS — C3491 Malignant neoplasm of unspecified part of right bronchus or lung: Secondary | ICD-10-CM

## 2015-03-18 DIAGNOSIS — R748 Abnormal levels of other serum enzymes: Secondary | ICD-10-CM | POA: Diagnosis not present

## 2015-03-18 LAB — URINALYSIS, MICROSCOPIC - CHCC
BLOOD: NEGATIVE
Bilirubin (Urine): NEGATIVE
GLUCOSE UR CHCC: NEGATIVE mg/dL
KETONES: NEGATIVE mg/dL
Nitrite: NEGATIVE
PH: 6.5 (ref 4.6–8.0)
PROTEIN: 30 mg/dL
SPECIFIC GRAVITY, URINE: 1.005 (ref 1.003–1.035)
UROBILINOGEN UR: 0.2 mg/dL (ref 0.2–1)

## 2015-03-18 LAB — CBC WITH DIFFERENTIAL/PLATELET
BASO%: 0.3 % (ref 0.0–2.0)
Basophils Absolute: 0 10*3/uL (ref 0.0–0.1)
EOS ABS: 0 10*3/uL (ref 0.0–0.5)
EOS%: 2.3 % (ref 0.0–7.0)
HEMATOCRIT: 24.1 % — AB (ref 34.8–46.6)
HGB: 8.1 g/dL — ABNORMAL LOW (ref 11.6–15.9)
LYMPH#: 0.5 10*3/uL — AB (ref 0.9–3.3)
LYMPH%: 35.8 % (ref 14.0–49.7)
MCH: 35.3 pg — ABNORMAL HIGH (ref 25.1–34.0)
MCHC: 33.6 g/dL (ref 31.5–36.0)
MCV: 104.9 fL — AB (ref 79.5–101.0)
MONO#: 0.1 10*3/uL (ref 0.1–0.9)
MONO%: 4.1 % (ref 0.0–14.0)
NEUT%: 57.5 % (ref 38.4–76.8)
NEUTROS ABS: 0.9 10*3/uL — AB (ref 1.5–6.5)
PLATELETS: 103 10*3/uL — AB (ref 145–400)
RBC: 2.3 10*6/uL — AB (ref 3.70–5.45)
RDW: 19.2 % — ABNORMAL HIGH (ref 11.2–14.5)
WBC: 1.5 10*3/uL — AB (ref 3.9–10.3)

## 2015-03-18 LAB — COMPREHENSIVE METABOLIC PANEL (CC13)
ALK PHOS: 84 U/L (ref 40–150)
ALT: 21 U/L (ref 0–55)
ANION GAP: 12 meq/L — AB (ref 3–11)
AST: 26 U/L (ref 5–34)
Albumin: 3.3 g/dL — ABNORMAL LOW (ref 3.5–5.0)
BILIRUBIN TOTAL: 1.02 mg/dL (ref 0.20–1.20)
BUN: 17.3 mg/dL (ref 7.0–26.0)
CALCIUM: 8.4 mg/dL (ref 8.4–10.4)
CO2: 20 meq/L — AB (ref 22–29)
CREATININE: 1.1 mg/dL (ref 0.6–1.1)
Chloride: 102 mEq/L (ref 98–109)
EGFR: 42 mL/min/{1.73_m2} — AB (ref >90)
Glucose: 130 mg/dl (ref 70–140)
Potassium: 3.1 mEq/L — ABNORMAL LOW (ref 3.5–5.1)
Sodium: 135 mEq/L — ABNORMAL LOW (ref 136–145)
TOTAL PROTEIN: 6.2 g/dL — AB (ref 6.4–8.3)

## 2015-03-18 NOTE — Progress Notes (Signed)
Went to get patient to triage for appt with [facility]- unable to locate pt. Called cell number and LM for rtn call.

## 2015-03-18 NOTE — Progress Notes (Signed)
Error- pt left before being seen.

## 2015-03-18 NOTE — Progress Notes (Signed)
Pt at clinic for labs.  Daughter crying and requesting BP from tech - states she has an appt with [facility] today.  Tech took BP - 97/42, 92/46 and reported to this RN.    This RN spoke with pt and her daughter.  Pt c/o back pain, across lower back, 5/10, for the past few weeks.  Pt unable to describe.  Pt states she "just feel so bad".    Pt c/o productive cough for past 3 days - white sputum.  Daughter reports no temp checks at home.  Advised her to obtain thermometer and check temps daily while on chemo.  Complete set VS taken - see Epic.  Pt received Alimta/carbo 9/26.  Pt denies chest pain, SOB, GI problems, difficulty thinking.    Pt to see [employer].  Labs and pof entered.  Telephone note routed to [employer].

## 2015-03-18 NOTE — Telephone Encounter (Signed)
Pt at clinic for labs.  Daughter crying and requesting BP from tech - states she has an appt with [facility] today.  Tech took BP - 97/42, 92/46 and reported to this RN.    This RN spoke with pt and her daughter.  Pt c/o back pain, across lower back, 5/10, for the past few weeks.  Pt unable to describe.  Pt states she "just feel so bad".    Pt c/o productive cough for past 3 days - white sputum.  Daughter reports no temp checks at home.  Advised her to obtain thermometer and check temps daily while on chemo.  Complete set VS taken - see Epic.  Pt received Alimta/carbo 9/26.  Pt denies chest pain, SOB, GI problems, difficulty thinking.    Pt to see [employer].  Labs and pof entered.

## 2015-03-20 ENCOUNTER — Other Ambulatory Visit: Payer: Self-pay | Admitting: Nurse Practitioner

## 2015-03-20 ENCOUNTER — Telehealth: Payer: Self-pay | Admitting: Nurse Practitioner

## 2015-03-20 DIAGNOSIS — R300 Dysuria: Secondary | ICD-10-CM

## 2015-03-20 LAB — URINE CULTURE

## 2015-03-20 NOTE — Telephone Encounter (Signed)
Patient presented to the [facility] this past Monday, 03/18/2015 for labs only.  She requested a walk-in appointment for the symptom management clinic that same day; but left without being seen.  Reviewed urine culture that resulted just today-and it does appear the patient was positive for UTI.  Called patient to check in with her; and patient stated that she had no URI or UTI symptoms whatsoever.  She also denied any recent fevers or chills.  She does feel slightly dehydrated.  Reviewed all labs with patient; and advised patient that she was consider neutropenic with an ANC of 0.9.  Reviewed all neutropenia guidelines with the patient as well.  Patient is also slightly more anemic; with hemoglobin 8.1.  Patient denies any shortness of breath or pain with inspiration.  Also, patient's potassium is down to 3.1.  Do to all of the combined issues-advised patient would be best if she was seen here at the clinic for follow-up.  Patient stated that she preferred to come in the afternoon for her appointment.  Patient will be scheduled for labs on Thursday, 03/21/2015 at 2 PM; and will be seen in the symptom management clinic at 2:30 PM.  Also, patient was advised to go directly to the emergency department overnight.  If she develops any worsening symptoms whatsoever.

## 2015-03-21 ENCOUNTER — Other Ambulatory Visit: Payer: Self-pay | Admitting: Nurse Practitioner

## 2015-03-21 ENCOUNTER — Other Ambulatory Visit (HOSPITAL_BASED_OUTPATIENT_CLINIC_OR_DEPARTMENT_OTHER): Payer: Medicare Other

## 2015-03-21 ENCOUNTER — Ambulatory Visit (HOSPITAL_BASED_OUTPATIENT_CLINIC_OR_DEPARTMENT_OTHER): Payer: Medicare Other | Admitting: Nurse Practitioner

## 2015-03-21 ENCOUNTER — Ambulatory Visit (HOSPITAL_COMMUNITY)
Admission: RE | Admit: 2015-03-21 | Discharge: 2015-03-21 | Disposition: A | Discharge status: Home / Self Care | Payer: Medicare Other | Source: Ambulatory Visit | Attending: Internal Medicine | Admitting: Internal Medicine

## 2015-03-21 VITALS — BP 114/56 | HR 106 | Temp 98.1°F | Resp 18 | Ht 63.0 in | Wt 140.1 lb

## 2015-03-21 DIAGNOSIS — M7989 Other specified soft tissue disorders: Secondary | ICD-10-CM

## 2015-03-21 DIAGNOSIS — Z888 Allergy status to other drugs, medicaments and biological substances status: Secondary | ICD-10-CM

## 2015-03-21 DIAGNOSIS — I82401 Acute embolism and thrombosis of unspecified deep veins of right lower extremity: Secondary | ICD-10-CM | POA: Diagnosis present

## 2015-03-21 DIAGNOSIS — D6481 Anemia due to antineoplastic chemotherapy: Secondary | ICD-10-CM | POA: Insufficient documentation

## 2015-03-21 DIAGNOSIS — R300 Dysuria: Secondary | ICD-10-CM

## 2015-03-21 DIAGNOSIS — N390 Urinary tract infection, site not specified: Secondary | ICD-10-CM | POA: Diagnosis not present

## 2015-03-21 DIAGNOSIS — T451X5A Adverse effect of antineoplastic and immunosuppressive drugs, initial encounter: Principal | ICD-10-CM

## 2015-03-21 DIAGNOSIS — C3491 Malignant neoplasm of unspecified part of right bronchus or lung: Secondary | ICD-10-CM

## 2015-03-21 DIAGNOSIS — Z87891 Personal history of nicotine dependence: Secondary | ICD-10-CM

## 2015-03-21 DIAGNOSIS — I517 Cardiomegaly: Secondary | ICD-10-CM | POA: Diagnosis present

## 2015-03-21 DIAGNOSIS — Z79891 Long term (current) use of opiate analgesic: Secondary | ICD-10-CM

## 2015-03-21 DIAGNOSIS — C3431 Malignant neoplasm of lower lobe, right bronchus or lung: Secondary | ICD-10-CM

## 2015-03-21 DIAGNOSIS — D696 Thrombocytopenia, unspecified: Secondary | ICD-10-CM | POA: Diagnosis present

## 2015-03-21 DIAGNOSIS — E039 Hypothyroidism, unspecified: Secondary | ICD-10-CM | POA: Diagnosis present

## 2015-03-21 DIAGNOSIS — R600 Localized edema: Secondary | ICD-10-CM

## 2015-03-21 DIAGNOSIS — Z5189 Encounter for other specified aftercare: Secondary | ICD-10-CM

## 2015-03-21 DIAGNOSIS — B957 Other staphylococcus as the cause of diseases classified elsewhere: Secondary | ICD-10-CM | POA: Diagnosis present

## 2015-03-21 DIAGNOSIS — J189 Pneumonia, unspecified organism: Secondary | ICD-10-CM | POA: Diagnosis not present

## 2015-03-21 DIAGNOSIS — R609 Edema, unspecified: Secondary | ICD-10-CM

## 2015-03-21 DIAGNOSIS — D701 Agranulocytosis secondary to cancer chemotherapy: Secondary | ICD-10-CM

## 2015-03-21 DIAGNOSIS — Z7901 Long term (current) use of anticoagulants: Secondary | ICD-10-CM

## 2015-03-21 DIAGNOSIS — N289 Disorder of kidney and ureter, unspecified: Secondary | ICD-10-CM

## 2015-03-21 DIAGNOSIS — Z881 Allergy status to other antibiotic agents status: Secondary | ICD-10-CM

## 2015-03-21 DIAGNOSIS — Z79899 Other long term (current) drug therapy: Secondary | ICD-10-CM

## 2015-03-21 DIAGNOSIS — E860 Dehydration: Secondary | ICD-10-CM | POA: Diagnosis not present

## 2015-03-21 DIAGNOSIS — J209 Acute bronchitis, unspecified: Secondary | ICD-10-CM | POA: Diagnosis present

## 2015-03-21 DIAGNOSIS — R748 Abnormal levels of other serum enzymes: Secondary | ICD-10-CM | POA: Diagnosis not present

## 2015-03-21 DIAGNOSIS — I10 Essential (primary) hypertension: Secondary | ICD-10-CM | POA: Diagnosis present

## 2015-03-21 DIAGNOSIS — Y95 Nosocomial condition: Secondary | ICD-10-CM | POA: Diagnosis present

## 2015-03-21 DIAGNOSIS — E440 Moderate protein-calorie malnutrition: Secondary | ICD-10-CM | POA: Diagnosis present

## 2015-03-21 DIAGNOSIS — Z6824 Body mass index (BMI) 24.0-24.9, adult: Secondary | ICD-10-CM

## 2015-03-21 DIAGNOSIS — K521 Toxic gastroenteritis and colitis: Secondary | ICD-10-CM | POA: Diagnosis present

## 2015-03-21 DIAGNOSIS — E876 Hypokalemia: Secondary | ICD-10-CM

## 2015-03-21 LAB — HOLD TUBE, BLOOD BANK

## 2015-03-21 LAB — COMPREHENSIVE METABOLIC PANEL (CC13)
ALT: 18 U/L (ref 0–55)
AST: 27 U/L (ref 5–34)
Albumin: 3.4 g/dL — ABNORMAL LOW (ref 3.5–5.0)
Alkaline Phosphatase: 92 U/L (ref 40–150)
Anion Gap: 10 mEq/L (ref 3–11)
BILIRUBIN TOTAL: 0.74 mg/dL (ref 0.20–1.20)
BUN: 13.5 mg/dL (ref 7.0–26.0)
CO2: 22 meq/L (ref 22–29)
CREATININE: 1 mg/dL (ref 0.6–1.1)
Calcium: 8.2 mg/dL — ABNORMAL LOW (ref 8.4–10.4)
Chloride: 105 mEq/L (ref 98–109)
EGFR: 53 mL/min/{1.73_m2} — ABNORMAL LOW (ref >90)
GLUCOSE: 91 mg/dL (ref 70–140)
Potassium: 3.4 mEq/L — ABNORMAL LOW (ref 3.5–5.1)
SODIUM: 136 meq/L (ref 136–145)
TOTAL PROTEIN: 6.2 g/dL — AB (ref 6.4–8.3)

## 2015-03-21 LAB — CBC WITH DIFFERENTIAL/PLATELET
BASO%: 0.4 % (ref 0.0–2.0)
Basophils Absolute: 0 10*3/uL (ref 0.0–0.1)
EOS%: 1.3 % (ref 0.0–7.0)
Eosinophils Absolute: 0 10*3/uL (ref 0.0–0.5)
HCT: 22 % — ABNORMAL LOW (ref 34.8–46.6)
HGB: 7.4 g/dL — ABNORMAL LOW (ref 11.6–15.9)
LYMPH%: 40 % (ref 14.0–49.7)
MCH: 35.1 pg — ABNORMAL HIGH (ref 25.1–34.0)
MCHC: 33.5 g/dL (ref 31.5–36.0)
MCV: 104.8 fL — ABNORMAL HIGH (ref 79.5–101.0)
MONO#: 0.2 10*3/uL (ref 0.1–0.9)
MONO%: 15.4 % — AB (ref 0.0–14.0)
NEUT%: 42.9 % (ref 38.4–76.8)
NEUTROS ABS: 0.5 10*3/uL — AB (ref 1.5–6.5)
PLATELETS: 67 10*3/uL — AB (ref 145–400)
RBC: 2.1 10*6/uL — ABNORMAL LOW (ref 3.70–5.45)
RDW: 18.8 % — ABNORMAL HIGH (ref 11.2–14.5)
WBC: 1.1 10*3/uL — AB (ref 3.9–10.3)
lymph#: 0.4 10*3/uL — ABNORMAL LOW (ref 0.9–3.3)

## 2015-03-21 LAB — ABO/RH: ABO/RH(D): A POS

## 2015-03-21 LAB — PREPARE RBC (CROSSMATCH)

## 2015-03-21 MED ORDER — POTASSIUM CHLORIDE CRYS ER 20 MEQ PO TBCR: 20.0000 meq | EXTENDED_RELEASE_TABLET | Freq: Every day | ORAL | Status: DC

## 2015-03-21 MED ORDER — TBO-FILGRASTIM 300 MCG/0.5ML ~~LOC~~ SOSY
300.0000 ug | PREFILLED_SYRINGE | Freq: Once | SUBCUTANEOUS | Status: AC
  Administered 2015-03-21: 300 ug via SUBCUTANEOUS
  Filled 2015-03-21: qty 0.5

## 2015-03-21 MED ORDER — SULFAMETHOXAZOLE-TRIMETHOPRIM 800-160 MG PO TABS: 1.0000 | ORAL_TABLET | Freq: Two times a day (BID) | ORAL | Status: DC

## 2015-03-22 ENCOUNTER — Other Ambulatory Visit: Payer: Self-pay | Admitting: *Deleted

## 2015-03-22 ENCOUNTER — Ambulatory Visit (HOSPITAL_COMMUNITY)
Admission: RE | Admit: 2015-03-22 | Discharge: 2015-03-22 | Disposition: A | Discharge status: Home / Self Care | Payer: Medicare Other | Source: Ambulatory Visit | Attending: Internal Medicine | Admitting: Internal Medicine

## 2015-03-22 ENCOUNTER — Encounter (HOSPITAL_COMMUNITY): Payer: Medicare Other

## 2015-03-22 ENCOUNTER — Ambulatory Visit (HOSPITAL_BASED_OUTPATIENT_CLINIC_OR_DEPARTMENT_OTHER): Payer: Medicare Other | Admitting: Nurse Practitioner

## 2015-03-22 ENCOUNTER — Telehealth: Payer: Self-pay | Admitting: Internal Medicine

## 2015-03-22 ENCOUNTER — Ambulatory Visit (HOSPITAL_BASED_OUTPATIENT_CLINIC_OR_DEPARTMENT_OTHER)
Admission: RE | Admit: 2015-03-22 | Discharge: 2015-03-22 | Disposition: A | Discharge status: Home / Self Care | Payer: Medicare Other | Source: Ambulatory Visit | Attending: Nurse Practitioner | Admitting: Nurse Practitioner

## 2015-03-22 VITALS — BP 130/48 | HR 71 | Temp 98.0°F | Resp 20

## 2015-03-22 VITALS — BP 143/51 | HR 97 | Temp 98.5°F | Resp 18 | Ht 63.0 in | Wt 140.5 lb

## 2015-03-22 DIAGNOSIS — D6489 Other specified anemias: Secondary | ICD-10-CM

## 2015-03-22 DIAGNOSIS — D701 Agranulocytosis secondary to cancer chemotherapy: Secondary | ICD-10-CM | POA: Diagnosis not present

## 2015-03-22 DIAGNOSIS — Z5189 Encounter for other specified aftercare: Secondary | ICD-10-CM

## 2015-03-22 DIAGNOSIS — C3431 Malignant neoplasm of lower lobe, right bronchus or lung: Secondary | ICD-10-CM | POA: Diagnosis not present

## 2015-03-22 DIAGNOSIS — I824Z1 Acute embolism and thrombosis of unspecified deep veins of right distal lower extremity: Secondary | ICD-10-CM | POA: Insufficient documentation

## 2015-03-22 DIAGNOSIS — T451X5A Adverse effect of antineoplastic and immunosuppressive drugs, initial encounter: Principal | ICD-10-CM

## 2015-03-22 DIAGNOSIS — D6481 Anemia due to antineoplastic chemotherapy: Secondary | ICD-10-CM

## 2015-03-22 DIAGNOSIS — C3491 Malignant neoplasm of unspecified part of right bronchus or lung: Secondary | ICD-10-CM | POA: Diagnosis not present

## 2015-03-22 MED ORDER — HEPARIN SOD (PORK) LOCK FLUSH 100 UNIT/ML IV SOLN: 500.0000 [IU] | Freq: Every day | INTRAVENOUS | Status: DC | PRN

## 2015-03-22 MED ORDER — SODIUM CHLORIDE 0.9 % IV SOLN
250.0000 mL | Freq: Once | INTRAVENOUS | Status: AC
  Administered 2015-03-22: 250 mL via INTRAVENOUS

## 2015-03-22 MED ORDER — RIVAROXABAN (XARELTO) VTE STARTER PACK (15 & 20 MG): ORAL_TABLET | ORAL | Status: DC

## 2015-03-22 MED ORDER — SODIUM CHLORIDE 0.9 % IJ SOLN: 10.0000 mL | INTRAMUSCULAR | Status: DC | PRN

## 2015-03-22 MED ORDER — ACETAMINOPHEN 325 MG PO TABS
650.0000 mg | ORAL_TABLET | Freq: Once | ORAL | Status: AC
  Administered 2015-03-22: 650 mg via ORAL
  Filled 2015-03-22: qty 2

## 2015-03-22 MED ORDER — SODIUM CHLORIDE 0.9 % IJ SOLN: 3.0000 mL | INTRAMUSCULAR | Status: DC | PRN

## 2015-03-22 MED ORDER — TBO-FILGRASTIM 300 MCG/0.5ML ~~LOC~~ SOSY
300.0000 ug | PREFILLED_SYRINGE | Freq: Once | SUBCUTANEOUS | Status: AC
  Administered 2015-03-22: 300 ug via SUBCUTANEOUS
  Filled 2015-03-22: qty 0.5

## 2015-03-22 MED ORDER — DIPHENHYDRAMINE HCL 25 MG PO CAPS
25.0000 mg | ORAL_CAPSULE | Freq: Once | ORAL | Status: AC
  Administered 2015-03-22: 25 mg via ORAL
  Filled 2015-03-22: qty 1

## 2015-03-22 MED ORDER — HEPARIN SOD (PORK) LOCK FLUSH 100 UNIT/ML IV SOLN: 250.0000 [IU] | INTRAVENOUS | Status: DC | PRN

## 2015-03-22 NOTE — Progress Notes (Signed)
Call from RN on sickle cell floor, pt unable to get 2nd unit of blood due to blood will expire at 6pm, pt PIV has been repositioned a few times and is leaking at site. RN spoke with her charge RN and will r/s pt appt for tues 03/26/15 at [facility]. Pt will have lab appt Tuesday at [facility] for new T&C. Rn advised she will inform pt of appts. POF to scheduling, Blood orders entered.

## 2015-03-22 NOTE — Progress Notes (Signed)
  Pt received to the [facility] for 2 units of PRBC'S. Pt's IV site was leaking blood X's 3 and had to be restarted each time. Being so late in the day and having the 2nd unit of blood in the cooler expiring at 1800. We notified lab to take unit of blood back to them. Explained this to patient and notified Dr. Worthy Flank nurse that we would have to finish up her blood on another day. Pt is to receive her next unit of blood on  Tues October 11th. She is to go to the cancer center to be typed and crossed matched before returning to the [facility] for her transfusion.Pt and visitor voiced understanding.

## 2015-03-22 NOTE — Progress Notes (Signed)
VASCULAR LAB PRELIMINARY  PRELIMINARY  PRELIMINARY  PRELIMINARY  Right lower extremity venous duplex completed.    Preliminary report:  Right:  DVT noted in the soleal vein of the calf.  No evidence of superficial thrombosis.  No Baker's cyst.  Christophr Calix, RVT 03/22/2015, 11:45 AM

## 2015-03-22 NOTE — Telephone Encounter (Signed)
lvm for pt on OCT appt....ok and aware

## 2015-03-22 NOTE — Progress Notes (Signed)
Discussed positive doppler results for DVT. Reviewed Xarelto precautions, directions and dosing. Pt verbalized an understanding and knows when to call this office with any concerns or questions.  Pt given Granix '300mg'$ . Escorted to [facility] for blood transfusion of 2 units today.  Xarelto script faxed to [facility] per pt request.

## 2015-03-22 NOTE — Patient Instructions (Signed)
Rivaroxaban oral tablets What is this medicine? RIVAROXABAN (ri va ROX a ban) is an anticoagulant (blood thinner). It is used to treat blood clots in the lungs or in the veins. It is also used after knee or hip surgeries to prevent blood clots. It is also used to lower the chance of stroke in people with a medical condition called atrial fibrillation. This medicine may be used for other purposes; ask your health care provider or pharmacist if you have questions. What should I tell my health care provider before I take this medicine? They need to know if you have any of these conditions: -bleeding disorders -bleeding in the brain -blood in your stools (black or tarry stools) or if you have blood in your vomit -history of stomach bleeding -kidney disease -liver disease -low blood counts, like low white cell, platelet, or red cell counts -recent or planned spinal or epidural procedure -take medicines that treat or prevent blood clots -an unusual or allergic reaction to rivaroxaban, other medicines, foods, dyes, or preservatives -pregnant or trying to get pregnant -breast-feeding How should I use this medicine? Take this medicine by mouth with a glass of water. Follow the directions on the prescription label. Take your medicine at regular intervals. Do not take it more often than directed. Do not stop taking except on your doctor's advice. Stopping this medicine may increase your risk of a blood clot. Be sure to refill your prescription before you run out of medicine. If you are taking this medicine after hip or knee replacement surgery, take it with or without food. If you are taking this medicine for atrial fibrillation, take it with your evening meal. If you are taking this medicine to treat blood clots, take it with food at the same time each day. If you are unable to swallow your tablet, you may crush the tablet and mix it in applesauce. Then, immediately eat the applesauce. You should eat more  food right after you eat the applesauce containing the crushed tablet. Talk to your pediatrician regarding the use of this medicine in children. Special care may be needed. Overdosage: If you think you have taken too much of this medicine contact a poison control center or emergency room at once. NOTE: This medicine is only for you. Do not share this medicine with others. What if I miss a dose? If you take your medicine once a day and miss a dose, take the missed dose as soon as you remember. If you take your medicine twice a day and miss a dose, take the missed dose immediately. In this instance, 2 tablets may be taken at the same time. The next day you should take 1 tablet twice a day as directed. What may interact with this medicine? -aspirin and aspirin-like medicines -certain antibiotics like erythromycin, azithromycin, and clarithromycin -certain medicines for fungal infections like ketoconazole and itraconazole -certain medicines for irregular heart beat like amiodarone, quinidine, dronedarone -certain medicines for seizures like carbamazepine, phenytoin -certain medicines that treat or prevent blood clots like warfarin, enoxaparin, and dalteparin -conivaptan -diltiazem -felodipine -indinavir -lopinavir; ritonavir -NSAIDS, medicines for pain and inflammation, like ibuprofen or naproxen -ranolazine -rifampin -ritonavir -St. John's wort -verapamil This list may not describe all possible interactions. Give your health care provider a list of all the medicines, herbs, non-prescription drugs, or dietary supplements you use. Also tell them if you smoke, drink alcohol, or use illegal drugs. Some items may interact with your medicine. What should I watch for while using this  medicine? Visit your doctor or health care professional for regular checks on your progress. Your condition will be monitored carefully while you are receiving this medicine. Notify your doctor or health care  professional and seek emergency treatment if you develop breathing problems; changes in vision; chest pain; severe, sudden headache; pain, swelling, warmth in the leg; trouble speaking; sudden numbness or weakness of the face, arm, or leg. These can be signs that your condition has gotten worse. If you are going to have surgery, tell your doctor or health care professional that you are taking this medicine. Tell your health care professional that you use this medicine before you have a spinal or epidural procedure. Sometimes people who take this medicine have bleeding problems around the spine when they have a spinal or epidural procedure. This bleeding is very rare. If you have a spinal or epidural procedure while on this medicine, call your health care professional immediately if you have back pain, numbness or tingling (especially in your legs and feet), muscle weakness, paralysis, or loss of bladder or bowel control. Avoid sports and activities that might cause injury while you are using this medicine. Severe falls or injuries can cause unseen bleeding. Be careful when using sharp tools or knives. Consider using an Copy. Take special care brushing or flossing your teeth. Report any injuries, bruising, or red spots on the skin to your doctor or health care professional. What side effects may I notice from receiving this medicine? Side effects that you should report to your doctor or health care professional as soon as possible: -allergic reactions like skin rash, itching or hives, swelling of the face, lips, or tongue -back pain -redness, blistering, peeling or loosening of the skin, including inside the mouth -signs and symptoms of bleeding such as bloody or black, tarry stools; red or dark-brown urine; spitting up blood or brown material that looks like coffee grounds; red spots on the skin; unusual bruising or bleeding from the eye, gums, or nose Side effects that usually do not require  medical attention (Report these to your doctor or health care professional if they continue or are bothersome.): -dizziness -muscle pain This list may not describe all possible side effects. Call your doctor for medical advice about side effects. You may report side effects to FDA at 1-800-FDA-1088. Where should I keep my medicine? Keep out of the reach of children. Store at room temperature between 15 and 30 degrees C (59 and 86 degrees F). Throw away any unused medicine after the expiration date. NOTE: This sheet is a summary. It may not cover all possible information. If you have questions about this medicine, talk to your doctor, pharmacist, or health care provider.    2016, [employer]vier/[employer] Standard. (2014-05-30 12:45:34) Tbo-Filgrastim injection What is this medicine? TBO-FILGRASTIM (T B O fil GRA stim) is a granulocyte colony-stimulating factor that stimulates the growth of neutrophils, a type of white blood cell important in the body's fight against infection. It is used to reduce the incidence of fever and infection in patients with certain types of cancer who are receiving chemotherapy that affects the bone marrow. This medicine may be used for other purposes; ask your health care provider or pharmacist if you have questions. What should I tell my health care provider before I take this medicine? They need to know if you have any of these conditions: -ongoing radiation therapy -sickle cell anemia -an unusual or allergic reaction to tbo-filgrastim, filgrastim, pegfilgrastim, other medicines, foods, dyes, or preservatives -pregnant  or trying to get pregnant -breast-feeding How should I use this medicine? This medicine is for injection under the skin. If you get this medicine at home, you will be taught how to prepare and give this medicine. Refer to the Instructions for Use that come with your medication packaging. Use exactly as directed. Take your medicine at regular intervals. Do not  take your medicine more often than directed. It is important that you put your used needles and syringes in a special sharps container. Do not put them in a trash can. If you do not have a sharps container, call your pharmacist or healthcare provider to get one. Talk to your pediatrician regarding the use of this medicine in children. Special care may be needed. Overdosage: If you think you have taken too much of this medicine contact a poison control center or emergency room at once. NOTE: This medicine is only for you. Do not share this medicine with others. What if I miss a dose? It is important not to miss your dose. Call your doctor or health care professional if you miss a dose. What may interact with this medicine? This medicine may interact with the following medications: -medicines that may cause a release of neutrophils, such as lithium This list may not describe all possible interactions. Give your health care provider a list of all the medicines, herbs, non-prescription drugs, or dietary supplements you use. Also tell them if you smoke, drink alcohol, or use illegal drugs. Some items may interact with your medicine. What should I watch for while using this medicine? You may need blood work done while you are taking this medicine. What side effects may I notice from receiving this medicine? Side effects that you should report to your doctor or health care professional as soon as possible: -allergic reactions like skin rash, itching or hives, swelling of the face, lips, or tongue -shortness of breath or breathing problems -fever -pain, redness, or irritation at site where injected -pinpoint red spots on the skin -stomach or side pain, or pain at the shoulder -swelling -tiredness -trouble passing urine Side effects that usually do not require medical attention (Report these to your doctor or health care professional if they continue or are bothersome.): -bone pain -muscle  pain This list may not describe all possible side effects. Call your doctor for medical advice about side effects. You may report side effects to FDA at 1-800-FDA-1088. Where should I keep my medicine? Keep out of the reach of children. Store in a refrigerator between 2 and 8 degrees C (36 and 46 degrees F). Keep in carton to protect from light. Throw away this medicine if it is left out of the refrigerator for more than 5 consecutive days. Throw away any unused medicine after the expiration date. NOTE: This sheet is a summary. It may not cover all possible information. If you have questions about this medicine, talk to your doctor, pharmacist, or health care provider.    2016, [employer]vier/Gold Standard. (2013-09-21 11:52:29)

## 2015-03-23 ENCOUNTER — Other Ambulatory Visit: Payer: Self-pay

## 2015-03-23 ENCOUNTER — Encounter (HOSPITAL_COMMUNITY): Payer: Self-pay | Admitting: Emergency Medicine

## 2015-03-23 ENCOUNTER — Inpatient Hospital Stay (HOSPITAL_COMMUNITY)
Admission: EM | Admit: 2015-03-23 | Discharge: 2015-03-25 | DRG: 194 | Disposition: A | Discharge status: Home Health | Payer: Medicare Other | Attending: Internal Medicine | Admitting: Internal Medicine

## 2015-03-23 ENCOUNTER — Emergency Department (HOSPITAL_COMMUNITY): Payer: Medicare Other

## 2015-03-23 ENCOUNTER — Ambulatory Visit (HOSPITAL_BASED_OUTPATIENT_CLINIC_OR_DEPARTMENT_OTHER): Payer: Medicare Other

## 2015-03-23 ENCOUNTER — Encounter: Payer: Self-pay | Admitting: Nurse Practitioner

## 2015-03-23 ENCOUNTER — Other Ambulatory Visit: Payer: Self-pay | Admitting: Nurse Practitioner

## 2015-03-23 VITALS — BP 153/83 | HR 99 | Temp 97.8°F | Resp 18

## 2015-03-23 DIAGNOSIS — D701 Agranulocytosis secondary to cancer chemotherapy: Secondary | ICD-10-CM | POA: Insufficient documentation

## 2015-03-23 DIAGNOSIS — D6481 Anemia due to antineoplastic chemotherapy: Secondary | ICD-10-CM | POA: Insufficient documentation

## 2015-03-23 DIAGNOSIS — N390 Urinary tract infection, site not specified: Secondary | ICD-10-CM | POA: Diagnosis present

## 2015-03-23 DIAGNOSIS — C3431 Malignant neoplasm of lower lobe, right bronchus or lung: Secondary | ICD-10-CM | POA: Diagnosis not present

## 2015-03-23 DIAGNOSIS — R0989 Other specified symptoms and signs involving the circulatory and respiratory systems: Secondary | ICD-10-CM

## 2015-03-23 DIAGNOSIS — C3491 Malignant neoplasm of unspecified part of right bronchus or lung: Secondary | ICD-10-CM | POA: Diagnosis present

## 2015-03-23 DIAGNOSIS — T451X5A Adverse effect of antineoplastic and immunosuppressive drugs, initial encounter: Secondary | ICD-10-CM

## 2015-03-23 DIAGNOSIS — O2230 Deep phlebothrombosis in pregnancy, unspecified trimester: Secondary | ICD-10-CM | POA: Diagnosis present

## 2015-03-23 DIAGNOSIS — J209 Acute bronchitis, unspecified: Secondary | ICD-10-CM | POA: Diagnosis present

## 2015-03-23 DIAGNOSIS — K521 Toxic gastroenteritis and colitis: Secondary | ICD-10-CM | POA: Diagnosis present

## 2015-03-23 DIAGNOSIS — J189 Pneumonia, unspecified organism: Secondary | ICD-10-CM | POA: Diagnosis present

## 2015-03-23 DIAGNOSIS — D696 Thrombocytopenia, unspecified: Secondary | ICD-10-CM | POA: Diagnosis present

## 2015-03-23 DIAGNOSIS — R600 Localized edema: Secondary | ICD-10-CM | POA: Insufficient documentation

## 2015-03-23 DIAGNOSIS — E440 Moderate protein-calorie malnutrition: Secondary | ICD-10-CM | POA: Insufficient documentation

## 2015-03-23 DIAGNOSIS — R609 Edema, unspecified: Secondary | ICD-10-CM | POA: Insufficient documentation

## 2015-03-23 DIAGNOSIS — E860 Dehydration: Secondary | ICD-10-CM | POA: Diagnosis present

## 2015-03-23 DIAGNOSIS — E876 Hypokalemia: Secondary | ICD-10-CM | POA: Insufficient documentation

## 2015-03-23 DIAGNOSIS — I82409 Acute embolism and thrombosis of unspecified deep veins of unspecified lower extremity: Secondary | ICD-10-CM | POA: Diagnosis present

## 2015-03-23 LAB — CBC WITH DIFFERENTIAL/PLATELET
BASOS PCT: 0 %
Basophils Absolute: 0 10*3/uL (ref 0.0–0.1)
Eosinophils Absolute: 0.1 10*3/uL (ref 0.0–0.7)
Eosinophils Relative: 1 %
HEMATOCRIT: 26.9 % — AB (ref 36.0–46.0)
HEMOGLOBIN: 9.5 g/dL — AB (ref 12.0–15.0)
LYMPHS PCT: 13 %
Lymphs Abs: 1.3 10*3/uL (ref 0.7–4.0)
MCH: 35.2 pg — AB (ref 26.0–34.0)
MCHC: 35.3 g/dL (ref 30.0–36.0)
MCV: 99.6 fL (ref 78.0–100.0)
MONOS PCT: 15 %
Monocytes Absolute: 1.5 10*3/uL — ABNORMAL HIGH (ref 0.1–1.0)
NEUTROS ABS: 7.3 10*3/uL (ref 1.7–7.7)
Neutrophils Relative %: 71 %
Platelets: 44 10*3/uL — ABNORMAL LOW (ref 150–400)
RBC: 2.7 MIL/uL — ABNORMAL LOW (ref 3.87–5.11)
RDW: 17 % — ABNORMAL HIGH (ref 11.5–15.5)
WBC: 10.2 10*3/uL (ref 4.0–10.5)

## 2015-03-23 LAB — COMPREHENSIVE METABOLIC PANEL
ALBUMIN: 4 g/dL (ref 3.5–5.0)
ALK PHOS: 103 U/L (ref 38–126)
ALT: 20 U/L (ref 14–54)
ANION GAP: 12 (ref 5–15)
AST: 33 U/L (ref 15–41)
BUN: 9 mg/dL (ref 6–20)
CHLORIDE: 104 mmol/L (ref 101–111)
CO2: 19 mmol/L — AB (ref 22–32)
Calcium: 8.4 mg/dL — ABNORMAL LOW (ref 8.9–10.3)
Creatinine, Ser: 0.99 mg/dL (ref 0.44–1.00)
GFR calc Af Amer: 57 mL/min — ABNORMAL LOW (ref >60)
GFR calc non Af Amer: 49 mL/min — ABNORMAL LOW (ref >60)
GLUCOSE: 98 mg/dL (ref 65–99)
POTASSIUM: 4 mmol/L (ref 3.5–5.1)
SODIUM: 135 mmol/L (ref 135–145)
Total Bilirubin: 0.7 mg/dL (ref 0.3–1.2)
Total Protein: 7 g/dL (ref 6.5–8.1)

## 2015-03-23 LAB — I-STAT CG4 LACTIC ACID, ED: LACTIC ACID, VENOUS: 2.29 mmol/L — AB (ref 0.5–2.0)

## 2015-03-23 LAB — I-STAT TROPONIN, ED: Troponin i, poc: 0.05 ng/mL (ref 0.00–0.08)

## 2015-03-23 MED ORDER — VANCOMYCIN HCL 10 G IV SOLR
1250.0000 mg | Freq: Once | INTRAVENOUS | Status: AC
  Administered 2015-03-24: 1250 mg via INTRAVENOUS
  Filled 2015-03-23: qty 1250

## 2015-03-23 MED ORDER — TBO-FILGRASTIM 300 MCG/0.5ML ~~LOC~~ SOSY
300.0000 ug | PREFILLED_SYRINGE | Freq: Once | SUBCUTANEOUS | Status: AC
  Administered 2015-03-23: 300 ug via SUBCUTANEOUS

## 2015-03-23 MED ORDER — PIPERACILLIN-TAZOBACTAM 3.375 G IVPB
3.3750 g | Freq: Once | INTRAVENOUS | Status: AC
  Administered 2015-03-23: 3.375 g via INTRAVENOUS
  Filled 2015-03-23: qty 50

## 2015-03-23 MED ORDER — SODIUM CHLORIDE 0.9 % IV BOLUS (SEPSIS)
1000.0000 mL | Freq: Once | INTRAVENOUS | Status: AC
  Administered 2015-03-23: 1000 mL via INTRAVENOUS

## 2015-03-23 NOTE — ED Notes (Signed)
Pt from home, driven here by her son. Pt's son states pt has had 15 weeks of chemo. She was recently diagnosed with a uti and a blood clot in her leg. Pt has had episodes of diarrhea and vomiting today. Pt also states she has generalized weakness that began when she started her chemo. Pt's son states she doesn't "take good care of herself"

## 2015-03-23 NOTE — Assessment & Plan Note (Signed)
Potassium down to 3.1.  Patient was prescribed potassium.  Will continue to monitor closely.

## 2015-03-23 NOTE — Assessment & Plan Note (Signed)
Chemotherapy-induced anemia.  Hemoglobin down to 7.4.  Patient is complaining of increased fatigue; but denies any shortness of breath.  Arrange for patient to receive 2 units packed red blood cell transfusion tomorrow 03/22/2015 in the sickle cell unit.

## 2015-03-23 NOTE — ED Provider Notes (Signed)
CSN: [id number]     Arrival date & time 03/23/15  2011 History   First MD Initiated Contact with Patient 03/23/15 2014     Chief Complaint  Patient presents with  . Weakness    Cancer patient. Had blood transfusion yesterday. Another blood transfusion scheduled for next week     (Consider location/radiation/quality/duration/timing/severity/associated sxs/prior Treatment) HPI Comments: Was on regimen of drinking, walking, has lost strength as chemo treatments have increased. Not eating or drinking the way she would Had BP medication still taking---stopped the BP medication herself--then  Diagnosed with UTI and blood clot   Patient is a 81 y.o. female presenting with weakness.  Weakness This is a new problem. The problem occurs constantly. The problem has been gradually worsening. Pertinent negatives include no chest pain, no abdominal pain, no headaches and no shortness of breath. Exacerbated by: chemotherapy. Nothing relieves the symptoms. Treatments tried: zofran. The treatment provided no relief.    Past Medical History  Diagnosis Date  . Hypertension   . External carotid artery stenosis     L side dx'd with doppler 2012  . UTI (lower urinary tract infection)   . Pneumonia   . Thyroid disease   . Cancer [facility])    Past Surgical History  Procedure Laterality Date  . Video bronchoscopy Bilateral 04/06/2014    Procedure: VIDEO BRONCHOSCOPY WITH FLUORO;  Surgeon: Juanito Doom, MD;  Location: [place];  Service: Cardiopulmonary;  Laterality: Bilateral;  . Eye surgery     Family History  Problem Relation Age of Onset  . Emphysema Father   . Stroke Sister   . Heart disease Sister    Social History  Substance Use Topics  . Smoking status: Former Smoker -- 1.00 packs/day for 12 years    Types: Cigarettes    Quit date: 06/16/1959  . Smokeless tobacco: Never Used  . Alcohol Use: No   OB History    No data available     Review of Systems  Constitutional: Positive  for activity change, appetite change and fatigue. Negative for fever.  HENT: Negative for sore throat.   Eyes: Negative for visual disturbance.  Respiratory: Positive for cough (getting worse over couple of weeks). Negative for shortness of breath.   Cardiovascular: Negative for chest pain.  Gastrointestinal: Positive for nausea, vomiting and diarrhea (off and on since chemo started, last week has been worse, twice/day). Negative for abdominal pain.  Genitourinary: Negative for dysuria, frequency and difficulty urinating.  Musculoskeletal: Negative for back pain and neck pain.  Skin: Negative for rash.  Neurological: Positive for weakness (generalized). Negative for syncope and headaches.      Allergies  Corticosteroids and Minocycline  Home Medications   Prior to Admission medications   Medication Sig Start Date End Date Taking? Authorizing Provider  dexamethasone (DECADRON) 4 MG tablet 4 mg po bid the day before, day of and day after chemotherapy 11/27/14  Yes Curt Bears, MD  doxazosin (CARDURA) 4 MG tablet Take 4 mg by mouth daily.  10/06/14  Yes Historical Provider, MD  folic acid (FOLVITE) 1 MG tablet Take 1 tablet (1 mg total) by mouth daily. 11/27/14  Yes Curt Bears, MD  levothyroxine (SYNTHROID, LEVOTHROID) 50 MCG tablet Take 50 mcg by mouth daily before breakfast.   Yes Historical Provider, MD  losartan (COZAAR) 100 MG tablet Take 100 mg by mouth daily.   Yes Historical Provider, MD  potassium chloride SA (K-DUR,KLOR-CON) 20 MEQ tablet Take 1 tablet (20 mEq total) by mouth  daily. 03/21/15  Yes Susanne Borders, NP  Rivaroxaban (XARELTO STARTER PACK) 15 & 20 MG TBPK Take as directed on package: Start with one '15mg'$  tablet by mouth twice a day with food. On Day 22, switch to one '20mg'$  tablet once a day with food. 03/22/15  Yes Susanne Borders, NP  sulfamethoxazole-trimethoprim (BACTRIM DS,SEPTRA DS) 800-160 MG tablet Take 1 tablet by mouth 2 (two) times daily. 03/21/15  Yes  Susanne Borders, NP  verapamil (CALAN-SR) 240 MG CR tablet Take 240 mg by mouth daily.  11/28/14  Yes Historical Provider, MD  HYDROcodone-acetaminophen (NORCO/VICODIN) 5-325 MG per tablet Take 1 tablet by mouth every 6 (six) hours as needed for moderate pain. Patient not taking: Reported on 03/04/2015 01/01/15   Susanne Borders, NP  prochlorperazine (COMPAZINE) 10 MG tablet Take 1 tablet (10 mg total) by mouth every 6 (six) hours as needed for nausea or vomiting. Patient not taking: Reported on 03/04/2015 11/27/14   Curt Bears, MD   There were no vitals taken for this visit. Physical Exam  Constitutional: She is oriented to person, place, and time. She appears well-developed and well-nourished. She appears ill (appears fatigued). No distress.  HENT:  Head: Normocephalic and atraumatic.  Eyes: Conjunctivae and EOM are normal.  Neck: Normal range of motion.  Cardiovascular: Normal rate, regular rhythm, normal heart sounds and intact distal pulses.  Exam reveals no gallop and no friction rub.   No murmur heard. Pulmonary/Chest: Effort normal. No respiratory distress. She has no wheezes. She has rales (bilateral lower fields, decreased bs RLL).  Abdominal: Soft. She exhibits no distension. There is no tenderness. There is no guarding and no CVA tenderness.  Musculoskeletal: She exhibits no edema or tenderness.  Neurological: She is alert and oriented to person, place, and time.  Skin: Skin is warm and dry. No rash noted. She is not diaphoretic. No erythema.  Nursing note and vitals reviewed.   ED Course  Procedures (including critical care time) Labs Review Labs Reviewed  COMPREHENSIVE METABOLIC PANEL - Abnormal; Notable for the following:    CO2 19 (*)    Calcium 8.4 (*)    GFR calc non Af Amer 49 (*)    GFR calc Af Amer 57 (*)    All other components within normal limits  CBC WITH DIFFERENTIAL/PLATELET - Abnormal; Notable for the following:    RBC 2.70 (*)    Hemoglobin 9.5 (*)     HCT 26.9 (*)    MCH 35.2 (*)    RDW 17.0 (*)    Platelets 44 (*)    Monocytes Absolute 1.5 (*)    All other components within normal limits  URINALYSIS, ROUTINE W REFLEX MICROSCOPIC (NOT AT [facility]) - Abnormal; Notable for the following:    Hgb urine dipstick TRACE (*)    All other components within normal limits  I-STAT CG4 LACTIC ACID, ED - Abnormal; Notable for the following:    Lactic Acid, Venous 2.29 (*)    All other components within normal limits  CULTURE, BLOOD (ROUTINE X 2)  CULTURE, BLOOD (ROUTINE X 2)  URINE CULTURE  URINE MICROSCOPIC-ADD ON  I-STAT TROPOININ, ED  I-STAT CG4 LACTIC ACID, ED    Imaging Review Dg Chest 2 View  03/23/2015   CLINICAL DATA:  81 year old with current history of adenocarcinoma of the right lower lobe, presenting with acute worsening of a chronic cough.  EXAM: CHEST  2 VIEW  COMPARISON:  CT chest 02/11/2015 and earlier. PET-CT 11/13/2014. Chest  x-rays 10/23/2014 and earlier.  FINDINGS: Cardiac silhouette moderately enlarged, unchanged. Thoracic aorta mildly tortuous and atherosclerotic. Hilar and mediastinal contours otherwise unremarkable. Airspace opacities in the right lower lobe consistent with the adenocarcinoma are improved since the most recent prior CT 02/11/2015. Emphysematous changes throughout both lungs as noted previously. Prominent bronchovascular markings diffusely, more so than on prior examinations. No new pulmonary parenchymal abnormalities otherwise. No pleural effusions. Pulmonary vascularity normal. Degenerative changes involving the thoracic spine.  IMPRESSION: 1. Mild to moderate changes of acute bronchitis and/or asthma superimposed upon COPD/emphysema. No acute cardiopulmonary disease otherwise. 2. Improvement in the airspace opacities in the right lower lobe shown on prior examinations to represent adenocarcinoma. 3. Stable cardiomegaly without evidence of pulmonary edema.   Electronically Signed   By: Evangeline Dakin M.D.   On:  03/23/2015 22:22   I have personally reviewed and evaluated these images and lab results as part of my medical decision-making.   EKG Interpretation None      MDM   Final diagnoses:  None    81 year old female with a history of non-small cell lung carcinoma on her fifth cycle of chemotherapy, concern for urinary tract infection diagnosed 2 days ago, DVT with initiation of Xarelto yesterday, presents with concern of continuing significant generalized weakness, inability to tolerate by mouth at home.  Given recent lab work showing a staph UTI and neutropenia with positive lactate, blood cultures were obtained and patient was given vancomycin and Zosyn. XR done given cough worsening, and shows no sign of pneumonia. CMP shows bicarb 19.   CBC returned today showing no sign of neutropenia. Given concern of dehydration, with patient unable to take in by mouth despite nausea medications at home, lactic acid of 2.29, and son's concerns of increasing generalized weakness, with finding of staph UTI on recent urine cx, will admit for observation and IVF.   Gareth Morgan, MD 03/24/15 [phone]

## 2015-03-23 NOTE — Progress Notes (Signed)
SYMPTOM MANAGEMENT CLINIC   HPI: [name] Carlson 81 y.o. female diagnosed with lung cancer.  Currently undergoing Alimta/carboplatin chemotherapy regimen.   Patient received her last cycle of Alimta/carboplatin chemotherapy on 03/11/2015.  Blood counts obtained today reveal a WBC of 1.1, ANC 0.5, hemoglobin 7.4, and platelet count 67.  Patient will receive Granix 300 mcg injections daily for a total of 3 days for growth factor support. She will receive 2 units blood transfusion tomorrow in the Sickle Cell Unit.   Pt reports increased fatigue, weakness, and UTI symptoms.   She denies any fever or chills.   HPI  ROS  Past Medical History  Diagnosis Date  . Hypertension   . External carotid artery stenosis     L side dx'd with doppler 2012  . UTI (lower urinary tract infection)   . Pneumonia   . Thyroid disease   . Cancer [facility])     Past Surgical History  Procedure Laterality Date  . Video bronchoscopy Bilateral 04/06/2014    Procedure: VIDEO BRONCHOSCOPY WITH FLUORO;  Surgeon: Juanito Doom, MD;  Location: [place];  Service: Cardiopulmonary;  Laterality: Bilateral;  . Eye surgery      has Abnormal chest CT; Cough; Fatigue; Non-small cell cancer of right lung ([place]); Lower back pain; Dysuria; UTI (urinary tract infection); Dehydration; Encounter for antineoplastic chemotherapy; Chemotherapy induced diarrhea; Chemotherapy induced neutropenia ([place]); Antineoplastic chemotherapy induced anemia; Hypokalemia; and Peripheral edema on her problem list.    is allergic to corticosteroids and minocycline.    Medication List       This list is accurate as of: 03/21/15 11:59 PM.  Always use your most recent med list.               aspirin 81 MG tablet  Take 81 mg by mouth daily.     dexamethasone 4 MG tablet  Commonly known as:  DECADRON  4 mg po bid the day before, day of and day after chemotherapy     doxazosin 4 MG tablet  Commonly known as:  CARDURA  Take 4 mg  by mouth daily.     folic acid 1 MG tablet  Commonly known as:  FOLVITE  Take 1 tablet (1 mg total) by mouth daily.     HYDROcodone-acetaminophen 5-325 MG tablet  Commonly known as:  NORCO/VICODIN  Take 1 tablet by mouth every 6 (six) hours as needed for moderate pain.     levothyroxine 50 MCG tablet  Commonly known as:  SYNTHROID, LEVOTHROID  Take 50 mcg by mouth daily before breakfast.     losartan 100 MG tablet  Commonly known as:  COZAAR  Take 100 mg by mouth daily.     potassium chloride SA 20 MEQ tablet  Commonly known as:  K-DUR,KLOR-CON  Take 1 tablet (20 mEq total) by mouth daily.     prochlorperazine 10 MG tablet  Commonly known as:  COMPAZINE  Take 1 tablet (10 mg total) by mouth every 6 (six) hours as needed for nausea or vomiting.     sulfamethoxazole-trimethoprim 800-160 MG tablet  Commonly known as:  BACTRIM DS,SEPTRA DS  Take 1 tablet by mouth 2 (two) times daily.     verapamil 240 MG CR tablet  Commonly known as:  CALAN-SR  Take 240 mg by mouth daily.         PHYSICAL EXAMINATION  Oncology Vitals 03/22/2015 03/22/2015 03/22/2015 03/22/2015 03/21/2015 03/11/2015 03/04/2015  Height - - - 160 cm 160 cm - 160  cm  Weight - - - 63.73 kg 63.549 kg - 64.547 kg  Weight (lbs) - - - 140 lbs 8 oz 140 lbs 2 oz - 142 lbs 5 oz  BMI (kg/m2) - - - 24.89 kg/m2 24.82 kg/m2 - 25.21 kg/m2  Temp 98 98.4 97.6 98.5 98.1 97.8 97.6  Pulse 71 81 79 97 106 74 68  Resp _0 SpO2 99 100 99 98 98 97 95  BSA (m2) - - - 1.68 m2 1.68 m2 - 1.69 m2   BP Readings from Last 3 Encounters:  03/22/15 130/48  03/22/15 143/51  03/21/15 114/56    Physical Exam  Constitutional: She is oriented to person, place, and time.  Patient appears fatigued, weak, and chronically ill.  HENT:  Head: Normocephalic and atraumatic.  Mouth/Throat: Oropharynx is clear and moist.  Eyes: Conjunctivae and EOM are normal. Pupils are equal, round, and reactive to light. Right eye exhibits no  discharge. Left eye exhibits no discharge. No scleral icterus.  Neck: Normal range of motion. Neck supple. No JVD present. No tracheal deviation present. No thyromegaly present.  Cardiovascular: Normal rate, regular rhythm, normal heart sounds and intact distal pulses.   Pulmonary/Chest: Effort normal and breath sounds normal. No respiratory distress. She has no wheezes. She has no rales. She exhibits no tenderness.  Abdominal: Soft. Bowel sounds are normal. She exhibits no distension and no mass. There is no tenderness. There is no rebound and no guarding.  Musculoskeletal: Normal range of motion. She exhibits edema. She exhibits no tenderness.  Right leg edema; but no calf tenderness.  Lymphadenopathy:    She has no cervical adenopathy.  Neurological: She is alert and oriented to person, place, and time.  Skin: Skin is warm and dry. No rash noted. No erythema. There is pallor.  Psychiatric: Affect normal.  Nursing note and vitals reviewed.   LABORATORY DATA:. Hospital Outpatient Visit on 03/21/2015  Component Date Value Ref Range Status  . ABO/RH(D) 03/21/2015 A POS   Final  . Antibody Screen 03/21/2015 NEG   Final  . Sample Expiration 03/21/2015 03/24/2015   Final  . Unit Number 03/21/2015 [id number]   Final  . Blood Component Type 03/21/2015 RED CELLS,LR   Final  . Unit division 03/21/2015 00   Final  . Status of Unit 03/21/2015 ALLOCATED   Final  . Transfusion Status 03/21/2015 OK TO TRANSFUSE   Final  . Crossmatch Result 03/21/2015 Compatible   Final  . Unit Number 03/21/2015 [id number]   Final  . Blood Component Type 03/21/2015 RED CELLS,LR   Final  . Unit division 03/21/2015 00   Final  . Status of Unit 03/21/2015 ISSUED   Final  . Transfusion Status 03/21/2015 OK TO TRANSFUSE   Final  . Crossmatch Result 03/21/2015 Compatible   Final  . Order Confirmation 03/21/2015 ORDER PROCESSED BY BLOOD BANK   Final  . ABO/RH(D) 03/21/2015 A POS   Final  Appointment on 03/21/2015   Component Date Value Ref Range Status  . WBC 03/21/2015 1.1* 3.9 - 10.3 10e3/uL Final  . NEUT# 03/21/2015 0.5* 1.5 - 6.5 10e3/uL Final  . HGB 03/21/2015 7.4* 11.6 - 15.9 g/dL Final  . HCT 03/21/2015 22.0* 34.8 - 46.6 % Final  . Platelets 03/21/2015 67* 145 - 400 10e3/uL Final  . MCV 03/21/2015 104.8* 79.5 - 101.0 fL Final  . MCH 03/21/2015 35.1* 25.1 - 34.0 pg Final  . MCHC 03/21/2015 33.5  31.5 - 36.0 g/dL  Final  . RBC 03/21/2015 2.10* 3.70 - 5.45 10e6/uL Final  . RDW 03/21/2015 18.8* 11.2 - 14.5 % Final  . lymph# 03/21/2015 0.4* 0.9 - 3.3 10e3/uL Final  . MONO# 03/21/2015 0.2  0.1 - 0.9 10e3/uL Final  . Eosinophils Absolute 03/21/2015 0.0  0.0 - 0.5 10e3/uL Final  . Basophils Absolute 03/21/2015 0.0  0.0 - 0.1 10e3/uL Final  . NEUT% 03/21/2015 42.9  38.4 - 76.8 % Final  . LYMPH% 03/21/2015 40.0  14.0 - 49.7 % Final  . MONO% 03/21/2015 15.4* 0.0 - 14.0 % Final  . EOS% 03/21/2015 1.3  0.0 - 7.0 % Final  . BASO% 03/21/2015 0.4  0.0 - 2.0 % Final  . Sodium 03/21/2015 136  136 - 145 mEq/L Final  . Potassium 03/21/2015 3.4* 3.5 - 5.1 mEq/L Final  . Chloride 03/21/2015 105  98 - 109 mEq/L Final  . CO2 03/21/2015 22  22 - 29 mEq/L Final  . Glucose 03/21/2015 91  70 - 140 mg/dl Final   Glucose reference range is for nonfasting patients. Fasting glucose reference range is 70- 100.  . BUN 03/21/2015 13.5  7.0 - 26.0 mg/dL Final  . Creatinine 03/21/2015 1.0  0.6 - 1.1 mg/dL Final  . Total Bilirubin 03/21/2015 0.74  0.20 - 1.20 mg/dL Final  . Alkaline Phosphatase 03/21/2015 92  40 - 150 U/L Final  . AST 03/21/2015 27  5 - 34 U/L Final  . ALT 03/21/2015 18  0 - 55 U/L Final  . Total Protein 03/21/2015 6.2* 6.4 - 8.3 g/dL Final  . Albumin 03/21/2015 3.4* 3.5 - 5.0 g/dL Final  . Calcium 03/21/2015 8.2* 8.4 - 10.4 mg/dL Final  . Anion Gap 03/21/2015 10  3 - 11 mEq/L Final  . EGFR 03/21/2015 53* >90 ml/min/1.73 m2 Final   eGFR is calculated using the CKD-EPI Creatinine Equation (2009)  . Hold  Tube, Blood Bank 03/21/2015 Type and Crossmatch Added   Final     RADIOGRAPHIC STUDIES: No results found.  ASSESSMENT/PLAN:    Non-small cell cancer of right lung Patient received her last cycle of Alimta/carboplatin chemotherapy on 03/11/2015.  Blood counts obtained today reveal a WBC of 1.1, ANC 0.5, hemoglobin 7.4, and platelet count 67.  Patient will receive Granix 300 mcg injections daily for a total of 3 days for growth factor support. She will receive 2 units blood transfusion tomorrow in the Sickle Cell Unit.   Pt reports increased fatigue, weakness, and UTI symptoms.   Dr. Mohamed advised pt that will plan on holding the last cycle of chemo due to poor tolerance.   Will scheduled restaging CT with contrast of the chest/abd/pelvis in the next 3-4 weeks.     UTI (urinary tract infection) Pt reports no UTI symptoms; but family reports that pt has not been showering on a consistent basis; and occ urinates herself.  UA obtained today confirms that pt does have an UTI; and urine culture just obtained earlier this week revealed that UTI was sensitive to Bactrim- but not Cipro or Levaquin. Will prescribe Bactrim abx.  Of note tho- most recent urine culture results still pending.     Dehydration Pt denies any nausea, vomiting, or diarrhea; but appears mildly dehydrated.  Pt refuses IV fluids; and states that she will push fluids at home.   Chemotherapy induced neutropenia ([facility]) Pt received her last Alimta/carboplatin chemotherapy regimen on 03/11/2015.  Blood counts obtained today reveal a WBC of 1.1, ANC of 0.5, hemoglobin 7.4, and platelet   count 67.  Pt will receive 3 days of Granix 300 mcg SQ for growth factor support.   Pt was also reminded of all neutropenia guidelines.   Pt appears tired; but nontoxic.  Pt was afebrile while at the cancer center.   Antineoplastic chemotherapy induced anemia Chemotherapy-induced anemia.  Hemoglobin down to 7.4.  Patient is complaining of  increased fatigue; but denies any shortness of breath.  Arrange for patient to receive 2 units packed red blood cell transfusion tomorrow 03/22/2015 in the sickle cell unit.  Hypokalemia Potassium down to 3.1.  Patient was prescribed potassium.  Will continue to monitor closely.  Peripheral edema Patient noted to have some moderate edema to her right lower extremity.  She has no calf tenderness.  She also denies any chest pain, chest pressure, shortness breath, or pain with inspiration.  Arrange for patient to obtain a Doppler ultrasound of the right lower extremity tomorrow morning.  She was advised to go to the emergency department overnight.  If she develops any worsening symptoms whatsoever.  Patient stated understanding of all instructions; and was in agreement with this plan of care. The patient knows to call the clinic with any problems, questions or concerns.   This is a shared visit with Dr. Julien Nordmann today.  Total time spent with patient was 40 minutes;  with greater than 75 percent of that time spent in face to face counseling regarding patient's symptoms,  and coordination of care and follow up.  Disclaimer:This dictation was prepared with Dragon/digital dictation along with [employer]. Any transcriptional errors that result from this process are unintentional.  Drue Second, NP 03/23/2015   ADDENDUM: Hematology/Oncology Attending: I had a face to face encounter with the patient. I recommended her care plan. This is a very pleasant 81 years old white female with metastatic non-small cell lung cancer, adenocarcinoma who is currently undergoing systemic chemotherapy with reduced dose carboplatin and Alimta. She status post 5 cycles. The patient presented to the symptom management clinic complaining of increasing fatigue and weakness as well as urinary tract symptoms. Will check a urinalysis today. The patient was also found on blood work to have leukocytopenia and  neutropenia as well as chemotherapy-induced anemia. Will start the patient on Granix 300 g subcutaneously for the next 3 days. For the chemotherapy-induced anemia, we will arrange for the patient to receive 2 units of PRBCs transfusion. The patient also had swelling of the right lower extremity. We will arrange for Doppler of the lower extremity to rule out deep venous thrombosis. I also had a lengthy discussion with the patient and her family about her current condition and treatment options. I recommended for her to discontinue her current treatment with carboplatin and Alimta at this point. We will arrange for the patient to have repeat CT scan of the chest, abdomen and pelvis in 3 weeks for restaging of her disease. If no evidence for disease progression, the patient will be considered for either maintenance therapy with single agent Alimta or just observation and close monitoring. The patient and her family agreed to the current plan. She was advised to call immediately if she has any concerning symptoms in the interval.  Disclaimer: This note was dictated with voice recognition software. Similar sounding words can inadvertently be transcribed and may be missed upon review. Eilleen Kempf., MD 03/23/2015

## 2015-03-23 NOTE — Patient Instructions (Signed)

## 2015-03-23 NOTE — Assessment & Plan Note (Addendum)
Pt received her last Alimta/carboplatin chemotherapy regimen on 03/11/2015.  Blood counts obtained today reveal a WBC of 1.1, ANC of 0.5, hemoglobin 7.4, and platelet count 67.  Pt will receive 3 days of Granix 300 mcg SQ for growth factor support.   Pt was also reminded of all neutropenia guidelines.   Pt appears tired; but nontoxic.  Pt was afebrile while at the cancer center.

## 2015-03-23 NOTE — ED Notes (Signed)
Pharmacy tech in with pt

## 2015-03-23 NOTE — Assessment & Plan Note (Signed)
Pt reports no UTI symptoms; but family reports that pt has not been showering on a consistent basis; and occ urinates herself.  UA obtained today confirms that pt does have an UTI; and urine culture just obtained earlier this week revealed that UTI was sensitive to Bactrim- but not Cipro or Levaquin. Will prescribe Bactrim abx.  Of note tho- most recent urine culture results still pending.

## 2015-03-23 NOTE — Progress Notes (Signed)
Addendum: Pt advised to hold any further aspirin.

## 2015-03-23 NOTE — Assessment & Plan Note (Signed)
Patient received her last cycle of Alimta/carboplatin chemotherapy on 03/11/2015.  Blood counts obtained today reveal a WBC of 1.1, ANC 0.5, hemoglobin 7.4, and platelet count 67.  Patient will receive Granix 300 mcg injections daily for a total of 3 days for growth factor support. She will receive 2 units blood transfusion tomorrow in the Sickle Cell Unit.   Pt reports increased fatigue, weakness, and UTI symptoms.   Dr. Julien Nordmann advised pt that will plan on holding the last cycle of chemo due to poor tolerance.   Will scheduled restaging CT with contrast of the chest/abd/pelvis in the next 3-4 weeks.

## 2015-03-23 NOTE — Assessment & Plan Note (Signed)
Patient noted to have some moderate edema to her right lower extremity.  She has no calf tenderness.  She also denies any chest pain, chest pressure, shortness breath, or pain with inspiration.  Arrange for patient to obtain a Doppler ultrasound of the right lower extremity tomorrow morning.  She was advised to go to the emergency department overnight.  If she develops any worsening symptoms whatsoever.

## 2015-03-23 NOTE — ED Notes (Signed)
Istat lactic acid results given to EDP

## 2015-03-23 NOTE — Assessment & Plan Note (Signed)
Pt denies any nausea, vomiting, or diarrhea; but appears mildly dehydrated.  Pt refuses IV fluids; and states that she will push fluids at home.

## 2015-03-24 ENCOUNTER — Encounter (HOSPITAL_COMMUNITY): Payer: Self-pay | Admitting: Internal Medicine

## 2015-03-24 ENCOUNTER — Observation Stay (HOSPITAL_COMMUNITY): Payer: Medicare Other

## 2015-03-24 DIAGNOSIS — Z79891 Long term (current) use of opiate analgesic: Secondary | ICD-10-CM | POA: Diagnosis not present

## 2015-03-24 DIAGNOSIS — I82401 Acute embolism and thrombosis of unspecified deep veins of right lower extremity: Secondary | ICD-10-CM | POA: Diagnosis present

## 2015-03-24 DIAGNOSIS — I82409 Acute embolism and thrombosis of unspecified deep veins of unspecified lower extremity: Secondary | ICD-10-CM | POA: Diagnosis present

## 2015-03-24 DIAGNOSIS — J209 Acute bronchitis, unspecified: Secondary | ICD-10-CM | POA: Diagnosis present

## 2015-03-24 DIAGNOSIS — D696 Thrombocytopenia, unspecified: Secondary | ICD-10-CM | POA: Diagnosis present

## 2015-03-24 DIAGNOSIS — K521 Toxic gastroenteritis and colitis: Secondary | ICD-10-CM | POA: Diagnosis present

## 2015-03-24 DIAGNOSIS — Z881 Allergy status to other antibiotic agents status: Secondary | ICD-10-CM | POA: Diagnosis not present

## 2015-03-24 DIAGNOSIS — I10 Essential (primary) hypertension: Secondary | ICD-10-CM | POA: Diagnosis present

## 2015-03-24 DIAGNOSIS — E440 Moderate protein-calorie malnutrition: Secondary | ICD-10-CM | POA: Diagnosis present

## 2015-03-24 DIAGNOSIS — Z87891 Personal history of nicotine dependence: Secondary | ICD-10-CM | POA: Diagnosis not present

## 2015-03-24 DIAGNOSIS — Y95 Nosocomial condition: Secondary | ICD-10-CM | POA: Diagnosis present

## 2015-03-24 DIAGNOSIS — E860 Dehydration: Secondary | ICD-10-CM | POA: Diagnosis present

## 2015-03-24 DIAGNOSIS — B957 Other staphylococcus as the cause of diseases classified elsewhere: Secondary | ICD-10-CM | POA: Diagnosis present

## 2015-03-24 DIAGNOSIS — E039 Hypothyroidism, unspecified: Secondary | ICD-10-CM | POA: Diagnosis present

## 2015-03-24 DIAGNOSIS — Z6824 Body mass index (BMI) 24.0-24.9, adult: Secondary | ICD-10-CM | POA: Diagnosis not present

## 2015-03-24 DIAGNOSIS — Z7901 Long term (current) use of anticoagulants: Secondary | ICD-10-CM | POA: Diagnosis not present

## 2015-03-24 DIAGNOSIS — T451X5A Adverse effect of antineoplastic and immunosuppressive drugs, initial encounter: Secondary | ICD-10-CM | POA: Diagnosis present

## 2015-03-24 DIAGNOSIS — C3491 Malignant neoplasm of unspecified part of right bronchus or lung: Secondary | ICD-10-CM | POA: Diagnosis present

## 2015-03-24 DIAGNOSIS — J189 Pneumonia, unspecified organism: Secondary | ICD-10-CM | POA: Diagnosis present

## 2015-03-24 DIAGNOSIS — I517 Cardiomegaly: Secondary | ICD-10-CM | POA: Diagnosis present

## 2015-03-24 DIAGNOSIS — I824Z1 Acute embolism and thrombosis of unspecified deep veins of right distal lower extremity: Secondary | ICD-10-CM | POA: Diagnosis not present

## 2015-03-24 DIAGNOSIS — Z888 Allergy status to other drugs, medicaments and biological substances status: Secondary | ICD-10-CM | POA: Diagnosis not present

## 2015-03-24 DIAGNOSIS — N390 Urinary tract infection, site not specified: Secondary | ICD-10-CM | POA: Diagnosis present

## 2015-03-24 DIAGNOSIS — Z79899 Other long term (current) drug therapy: Secondary | ICD-10-CM | POA: Diagnosis not present

## 2015-03-24 DIAGNOSIS — O2230 Deep phlebothrombosis in pregnancy, unspecified trimester: Secondary | ICD-10-CM | POA: Diagnosis present

## 2015-03-24 LAB — URINALYSIS, ROUTINE W REFLEX MICROSCOPIC
Bilirubin Urine: NEGATIVE
GLUCOSE, UA: NEGATIVE mg/dL
KETONES UR: NEGATIVE mg/dL
LEUKOCYTES UA: NEGATIVE
Nitrite: NEGATIVE
PH: 7 (ref 5.0–8.0)
PROTEIN: NEGATIVE mg/dL
Specific Gravity, Urine: 1.006 (ref 1.005–1.030)
Urobilinogen, UA: 1 mg/dL (ref 0.0–1.0)

## 2015-03-24 LAB — COMPREHENSIVE METABOLIC PANEL
ALBUMIN: 3.3 g/dL — AB (ref 3.5–5.0)
ALT: 16 U/L (ref 14–54)
AST: 27 U/L (ref 15–41)
Alkaline Phosphatase: 89 U/L (ref 38–126)
Anion gap: 9 (ref 5–15)
BUN: 7 mg/dL (ref 6–20)
CHLORIDE: 107 mmol/L (ref 101–111)
CO2: 20 mmol/L — AB (ref 22–32)
CREATININE: 0.89 mg/dL (ref 0.44–1.00)
Calcium: 7.8 mg/dL — ABNORMAL LOW (ref 8.9–10.3)
GFR calc Af Amer: >60 mL/min (ref >60)
GFR, EST NON AFRICAN AMERICAN: 56 mL/min — AB (ref >60)
Glucose, Bld: 86 mg/dL (ref 65–99)
POTASSIUM: 3.5 mmol/L (ref 3.5–5.1)
SODIUM: 136 mmol/L (ref 135–145)
Total Bilirubin: 0.9 mg/dL (ref 0.3–1.2)
Total Protein: 5.9 g/dL — ABNORMAL LOW (ref 6.5–8.1)

## 2015-03-24 LAB — CBC WITH DIFFERENTIAL/PLATELET
BASOS PCT: 0 %
Basophils Absolute: 0 10*3/uL (ref 0.0–0.1)
EOS PCT: 1 %
Eosinophils Absolute: 0.1 10*3/uL (ref 0.0–0.7)
HEMATOCRIT: 23.8 % — AB (ref 36.0–46.0)
Hemoglobin: 8.2 g/dL — ABNORMAL LOW (ref 12.0–15.0)
LYMPHS ABS: 1.4 10*3/uL (ref 0.7–4.0)
Lymphocytes Relative: 15 %
MCH: 34.6 pg — AB (ref 26.0–34.0)
MCHC: 34.5 g/dL (ref 30.0–36.0)
MCV: 100.4 fL — AB (ref 78.0–100.0)
MONOS PCT: 17 %
Monocytes Absolute: 1.6 10*3/uL — ABNORMAL HIGH (ref 0.1–1.0)
Neutro Abs: 6.1 10*3/uL (ref 1.7–7.7)
Neutrophils Relative %: 67 %
Platelets: 43 10*3/uL — ABNORMAL LOW (ref 150–400)
RBC: 2.37 MIL/uL — AB (ref 3.87–5.11)
RDW: 16.7 % — AB (ref 11.5–15.5)
WBC: 9.2 10*3/uL (ref 4.0–10.5)

## 2015-03-24 LAB — URINE MICROSCOPIC-ADD ON

## 2015-03-24 LAB — I-STAT CG4 LACTIC ACID, ED: Lactic Acid, Venous: 0.97 mmol/L (ref 0.5–2.0)

## 2015-03-24 LAB — PHOSPHORUS: Phosphorus: 1.6 mg/dL — ABNORMAL LOW (ref 2.5–4.6)

## 2015-03-24 MED ORDER — SODIUM CHLORIDE 0.9 % IJ SOLN
3.0000 mL | Freq: Two times a day (BID) | INTRAMUSCULAR | Status: DC
  Administered 2015-03-24: 3 mL via INTRAVENOUS

## 2015-03-24 MED ORDER — LEVALBUTEROL HCL 0.63 MG/3ML IN NEBU
0.6300 mg | INHALATION_SOLUTION | Freq: Four times a day (QID) | RESPIRATORY_TRACT | Status: DC
  Administered 2015-03-24 – 2015-03-25 (×3): 0.63 mg via RESPIRATORY_TRACT
  Filled 2015-03-24 (×4): qty 3

## 2015-03-24 MED ORDER — FOLIC ACID 1 MG PO TABS
1.0000 mg | ORAL_TABLET | Freq: Every day | ORAL | Status: DC
  Administered 2015-03-24 – 2015-03-25 (×2): 1 mg via ORAL
  Filled 2015-03-24 (×2): qty 1

## 2015-03-24 MED ORDER — POTASSIUM CHLORIDE CRYS ER 20 MEQ PO TBCR
20.0000 meq | EXTENDED_RELEASE_TABLET | Freq: Every day | ORAL | Status: DC
  Administered 2015-03-24 – 2015-03-25 (×2): 20 meq via ORAL
  Filled 2015-03-24 (×2): qty 1

## 2015-03-24 MED ORDER — IPRATROPIUM BROMIDE 0.02 % IN SOLN
0.5000 mg | Freq: Four times a day (QID) | RESPIRATORY_TRACT | Status: DC
  Administered 2015-03-24 – 2015-03-25 (×3): 0.5 mg via RESPIRATORY_TRACT
  Filled 2015-03-24 (×4): qty 2.5

## 2015-03-24 MED ORDER — ONDANSETRON HCL 4 MG PO TABS: 4.0000 mg | ORAL_TABLET | Freq: Four times a day (QID) | ORAL | Status: DC | PRN

## 2015-03-24 MED ORDER — GUAIFENESIN ER 600 MG PO TB12
600.0000 mg | ORAL_TABLET | Freq: Two times a day (BID) | ORAL | Status: DC
  Administered 2015-03-24 – 2015-03-25 (×3): 600 mg via ORAL
  Filled 2015-03-24 (×3): qty 1

## 2015-03-24 MED ORDER — ONDANSETRON HCL 4 MG/2ML IJ SOLN: 4.0000 mg | Freq: Four times a day (QID) | INTRAMUSCULAR | Status: DC | PRN

## 2015-03-24 MED ORDER — RIVAROXABAN 20 MG PO TABS
20.0000 mg | ORAL_TABLET | Freq: Every day | ORAL | Status: DC
Start: 2015-04-14 — End: 2015-03-25

## 2015-03-24 MED ORDER — RIVAROXABAN 15 MG PO TABS
15.0000 mg | ORAL_TABLET | Freq: Two times a day (BID) | ORAL | Status: DC
  Administered 2015-03-24 – 2015-03-25 (×2): 15 mg via ORAL
  Filled 2015-03-24 (×2): qty 1

## 2015-03-24 MED ORDER — ENSURE ENLIVE PO LIQD
237.0000 mL | Freq: Two times a day (BID) | ORAL | Status: DC
  Administered 2015-03-24 (×2): 237 mL via ORAL

## 2015-03-24 MED ORDER — RIVAROXABAN 20 MG PO TABS: 20.0000 mg | ORAL_TABLET | Freq: Every day | ORAL | Status: DC

## 2015-03-24 MED ORDER — MAGNESIUM SULFATE 2 GM/50ML IV SOLN
2.0000 g | Freq: Once | INTRAVENOUS | Status: AC
  Administered 2015-03-24: 2 g via INTRAVENOUS
  Filled 2015-03-24: qty 50

## 2015-03-24 MED ORDER — SODIUM CHLORIDE 0.9 % IV SOLN
INTRAVENOUS | Status: DC
  Administered 2015-03-24: 03:00:00 via INTRAVENOUS

## 2015-03-24 MED ORDER — RIVAROXABAN 15 MG PO TABS: 15.0000 mg | ORAL_TABLET | Freq: Two times a day (BID) | ORAL | Status: DC

## 2015-03-24 MED ORDER — LEVOTHYROXINE SODIUM 25 MCG PO TABS
50.0000 ug | ORAL_TABLET | Freq: Every day | ORAL | Status: DC
Start: 2015-03-24 — End: 2015-03-25
  Administered 2015-03-24 – 2015-03-25 (×2): 50 ug via ORAL
  Filled 2015-03-24 (×2): qty 2

## 2015-03-24 MED ORDER — POTASSIUM PHOSPHATE MONOBASIC 500 MG PO TABS
500.0000 mg | ORAL_TABLET | Freq: Three times a day (TID) | ORAL | Status: DC
  Administered 2015-03-24 – 2015-03-25 (×3): 500 mg via ORAL
  Filled 2015-03-24 (×7): qty 1

## 2015-03-24 MED ORDER — PIPERACILLIN-TAZOBACTAM 3.375 G IVPB
3.3750 g | Freq: Three times a day (TID) | INTRAVENOUS | Status: DC
  Administered 2015-03-24: 3.375 g via INTRAVENOUS
  Filled 2015-03-24: qty 50

## 2015-03-24 MED ORDER — DEXTROSE 5 % IV SOLN
2.0000 g | Freq: Two times a day (BID) | INTRAVENOUS | Status: DC
  Administered 2015-03-24 – 2015-03-25 (×2): 2 g via INTRAVENOUS
  Filled 2015-03-24 (×3): qty 2

## 2015-03-24 MED ORDER — VANCOMYCIN HCL IN DEXTROSE 1-5 GM/200ML-% IV SOLN
1000.0000 mg | INTRAVENOUS | Status: DC
Start: 2015-03-24 — End: 2015-03-25
  Administered 2015-03-25: 1000 mg via INTRAVENOUS
  Filled 2015-03-24: qty 200

## 2015-03-24 NOTE — H&P (Signed)
[facility] History and Physical  [name] Carlson ZOX:[id number] DOB: 09/02/1922 DOA: 03/23/2015  Referring physician: Gareth Morgan, MD PCP: Criselda Peaches, MD   Chief Complaint: Weakness.  HPI: [name] Carlson is a 81 y.o. female with a past medical history of hypertension, lung cancer, hypothyroidism, DVT on Xarelto, currently being treated for a UTI who comes with postchemotherapy progressively worse weakness, decreased appetite, fatigue, nausea, emesis and diarrhea for 2 weeks. She denies abdominal pain, dysuria or hematuria. While following at the cancer center, patient was diagnosed earlier this week with a coagulase-negative staph aureus UTI and the right lower extremity DVT. She was started on Bactrim and Xarelto. She received a blood transfusion due to anemia with leukopenia. She stopped her antihypertensives due to decreased oral intake that was causing her to be lightheaded. She denies fever, but complains of chills and fatigue, no headache, no earaches, no sore throat. However, she states that her chronic cough has become more productive over the last 2 weeks. She denies chest pain, diaphoresis, orthopnea, PND, but has had dizziness, palpitations and right lower extremity edema.  Workup in the ER reveals improving blood count, with the exception of platelets, resolving UTI and acute bronchitis.   Review of Systems:  Constitutional:  Positive chills, fatigue. Positive weight loss.   night sweats, Fevers,  HEENT:  No headaches, Difficulty swallowing,Tooth/dental problems,Sore throat,  No sneezing, itching, ear ache, nasal congestion, post nasal drip,  Cardio-vascular:  Positive dizziness, palpitations, right lower extremity edema. No chest pain, Orthopnea, PND, anasarca,   GI:  Positive nausea, vomiting, diarrhea,  No heartburn, indigestion, abdominal pain,change in bowel habits, loss of appetite  Resp:  Occasional shortness of breath with exertion or at rest.  Positive excess mucus, positive productive cough of whitish sputum, no hemoptysis. No change in color of mucus.No wheezing.No chest wall deformity  Skin:  no rash or lesions.  GU:  no dysuria, change in color of urine, no urgency or frequency. No flank pain.  Musculoskeletal:  No joint pain or swelling. No decreased range of motion. No back pain.  Psych:  No change in mood or affect. No depression or anxiety. No memory loss.   Past Medical History  Diagnosis Date  . Hypertension   . External carotid artery stenosis     L side dx'd with doppler 2012  . UTI (lower urinary tract infection)   . Pneumonia   . Thyroid disease   . Cancer [facility])    Past Surgical History  Procedure Laterality Date  . Video bronchoscopy Bilateral 04/06/2014    Procedure: VIDEO BRONCHOSCOPY WITH FLUORO;  Surgeon: Juanito Doom, MD;  Location: [place];  Service: Cardiopulmonary;  Laterality: Bilateral;  . Eye surgery     Social History:  reports that she quit smoking about 55 years ago. Her smoking use included Cigarettes. She has a 12 pack-year smoking history. She has never used smokeless tobacco. She reports that she does not drink alcohol or use illicit drugs.  Allergies  Allergen Reactions  . Corticosteroids   . Minocycline     Pt states became disoriented and had a stiff neck.    Family History  Problem Relation Age of Onset  . Emphysema Father   . Stroke Sister   . Heart disease Sister     Prior to Admission medications   Medication Sig Start Date End Date Taking? Authorizing Provider  dexamethasone (DECADRON) 4 MG tablet 4 mg po bid the day before, day of and day  after chemotherapy 11/27/14  Yes Curt Bears, MD  doxazosin (CARDURA) 4 MG tablet Take 4 mg by mouth daily.  10/06/14  Yes Historical Provider, MD  folic acid (FOLVITE) 1 MG tablet Take 1 tablet (1 mg total) by mouth daily. 11/27/14  Yes Curt Bears, MD  levothyroxine (SYNTHROID, LEVOTHROID) 50 MCG tablet Take 50 mcg  by mouth daily before breakfast.   Yes Historical Provider, MD  losartan (COZAAR) 100 MG tablet Take 100 mg by mouth daily.   Yes Historical Provider, MD  potassium chloride SA (K-DUR,KLOR-CON) 20 MEQ tablet Take 1 tablet (20 mEq total) by mouth daily. 03/21/15  Yes Susanne Borders, NP  Rivaroxaban (XARELTO STARTER PACK) 15 & 20 MG TBPK Take as directed on package: Start with one '15mg'$  tablet by mouth twice a day with food. On Day 22, switch to one '20mg'$  tablet once a day with food. 03/22/15  Yes Susanne Borders, NP  sulfamethoxazole-trimethoprim (BACTRIM DS,SEPTRA DS) 800-160 MG tablet Take 1 tablet by mouth 2 (two) times daily. 03/21/15  Yes Susanne Borders, NP  verapamil (CALAN-SR) 240 MG CR tablet Take 240 mg by mouth daily.  11/28/14  Yes Historical Provider, MD  HYDROcodone-acetaminophen (NORCO/VICODIN) 5-325 MG per tablet Take 1 tablet by mouth every 6 (six) hours as needed for moderate pain. Patient not taking: Reported on 03/04/2015 01/01/15   Susanne Borders, NP  prochlorperazine (COMPAZINE) 10 MG tablet Take 1 tablet (10 mg total) by mouth every 6 (six) hours as needed for nausea or vomiting. Patient not taking: Reported on 03/04/2015 11/27/14   Curt Bears, MD   Physical Exam: Filed Vitals:   03/24/15 0043  BP: 146/58  Pulse: 72  Resp: 20  SpO2: 97%    Wt Readings from Last 3 Encounters:  03/22/15 63.73 kg (140 lb 8 oz)  03/21/15 63.549 kg (140 lb 1.6 oz)  03/04/15 64.547 kg (142 lb 4.8 oz)    General:  Appears calm and comfortable Eyes: PERRL, normal lids, irises & conjunctiva ENT: grossly normal hearing, lips & tongue are dry. Neck: no LAD, masses or thyromegaly Cardiovascular: RRR, no m/r/g. No LE edema. Telemetry: SR, no arrhythmias  Respiratory: Bibasilar Rales, mild rhonchi and wheezing bilaterally. Abdomen: soft, ntnd Skin: no rash or induration seen on limited exam Musculoskeletal: grossly normal tone BUE/BLE Psychiatric: grossly normal mood and affect, speech fluent  and appropriate Neurologic: grossly non-focal.          Labs on Admission:  Basic Metabolic Panel:  Recent Labs Lab 03/18/15 1103 03/21/15 1411 03/23/15 2025  NA 135* 136 135  K 3.1* 3.4* 4.0  CL  --   --  104  CO2 20* 22 19*  GLUCOSE 130 91 98  BUN 17.3 13.5 9  CREATININE 1.1 1.0 0.99  CALCIUM 8.4 8.2* 8.4*   Liver Function Tests:  Recent Labs Lab 03/18/15 1103 03/21/15 1411 03/23/15 2025  AST 26 27 33  ALT '21 18 20  '$ ALKPHOS 84 92 103  BILITOT 1.02 0.74 0.7  PROT 6.2* 6.2* 7.0  ALBUMIN 3.3* 3.4* 4.0   CBC:  Recent Labs Lab 03/18/15 1102 03/21/15 1411 03/23/15 2025  WBC 1.5* 1.1* 10.2  NEUTROABS 0.9* 0.5* 7.3  HGB 8.1* 7.4* 9.5*  HCT 24.1* 22.0* 26.9*  MCV 104.9* 104.8* 99.6  PLT 103* 67* 44*    Radiological Exams on Admission: Dg Chest 2 View  03/23/2015   CLINICAL DATA:  81 year old with current history of adenocarcinoma of the right lower lobe, presenting with  acute worsening of a chronic cough.  EXAM: CHEST  2 VIEW  COMPARISON:  CT chest 02/11/2015 and earlier. PET-CT 11/13/2014. Chest x-rays 10/23/2014 and earlier.  FINDINGS: Cardiac silhouette moderately enlarged, unchanged. Thoracic aorta mildly tortuous and atherosclerotic. Hilar and mediastinal contours otherwise unremarkable. Airspace opacities in the right lower lobe consistent with the adenocarcinoma are improved since the most recent prior CT 02/11/2015. Emphysematous changes throughout both lungs as noted previously. Prominent bronchovascular markings diffusely, more so than on prior examinations. No new pulmonary parenchymal abnormalities otherwise. No pleural effusions. Pulmonary vascularity normal. Degenerative changes involving the thoracic spine.  IMPRESSION: 1. Mild to moderate changes of acute bronchitis and/or asthma superimposed upon COPD/emphysema. No acute cardiopulmonary disease otherwise. 2. Improvement in the airspace opacities in the right lower lobe shown on prior examinations to  represent adenocarcinoma. 3. Stable cardiomegaly without evidence of pulmonary edema.   Electronically Signed   By: Evangeline Dakin M.D.   On: 03/23/2015 22:22    EKG: Independently reviewed. Vent. rate 81 BPM PR interval 206 ms QRS duration 138 ms QT/QTc 429/498 ms P-R-T axes 48 -63 17 Sinus rhythm RBBB and LAFB  Assessment/Plan Principal Problem:   Acute bronchitis The patient had an as symptomatic transient episode of tachycardia while I was interviewing her her. We will avoid using albuterol. Start Xopenex and ipratropium nebulizer treatments. Supplemental oxygen. Continue IV antibiotics.  Active Problems:   Non-small cell cancer of right lung [facility]) Continue treatment Per oncology.    UTI (urinary tract infection) Hold Bactrim and continue IV antibiotics.    Dehydration Continue gentle hydration and monitor electrolytes. The patient was hypokalemic earlier this week.    Chemotherapy induced diarrhea No abdominal pain. Continue gentle hydration.    DVT (deep vein thrombosis) Continue Xarelto.    Thrombocytopenia Monitor platelet level.   Code Status: Full code. DVT Prophylaxis: On Xarelto home. Family Communication: Kenedi, Cilia [phone]  [phone]  Her son was present in the room and providing history. Contact information as above. Disposition Plan: Admit for observation, gentle hydration, bronchitis and UTI treatment.  Time spent: Over 70 minutes were spent in the process of his admission.  Reubin Milan [facility] Pager [phone].

## 2015-03-24 NOTE — Progress Notes (Signed)
[facility] PROGRESS NOTE  [name] Carlson DQQ:[id number] DOB: Oct 07, 1922 DOA: 03/23/2015 PCP: Criselda Peaches, MD  Brief Summary  [name] Carlson is a 81 y.o. female with a past medical history of hypertension, lung cancer, hypothyroidism, DVT on Xarelto, currently being treated for a UTI who comes with postchemotherapy progressively worse weakness, decreased appetite, fatigue, nausea, emesis and diarrhea for 2 weeks. She denies abdominal pain, dysuria or hematuria. While following at the cancer center, patient was diagnosed earlier this week with a coagulase-negative staph aureus UTI and the right lower extremity DVT. She was started on Bactrim and Xarelto. She received a blood transfusion due to anemia with leukopenia. She stopped her antihypertensives due to decreased oral intake that was causing her to be lightheaded. She denies fever, but complains of chills and fatigue, no headache, no earaches, no sore throat. However, she states that her chronic cough has become more productive over the last 2 weeks. She denies chest pain, diaphoresis, orthopnea, PND, but has had dizziness, palpitations and right lower extremity edema.  Workup in the ER reveals improving blood count, with the exception of platelets, resolving UTI and acute bronchitis.  Assessment/Plan  HCAP with worsening infiltrate on LLL, course rales and diminished in same area on exam -  Continue vancomycin and change zosyn to ceftaz due to cytopenias -  Continue nebulizer treatements -   Check urine legionella and S. pneumo -  Sputum culture if able -  Blood cultures pending  Non-small cell cancer of right lung ([place]) -  Continue treatment Per oncology.  Coag negative Staph UTI (urinary tract infection) -  Hold Bactrim and continue IV antibiotics.  Dehydration, improved with IVF -  D/c IVF  Chemotherapy induced diarrhea -  No abdominal pain. -  Continue gentle hydration.  Acute RLE DVT (deep vein thrombosis),  started a/c 10/8 -  Continue Xarelto unless platelet count less than 30 000 or evidence of bleeding per discussion with Dr. Irene Limbo  Thrombocytopenia, trending down but likely to nadir soon given timing of last dose of chemotherapy  -  Monitor platelet level.  Diet:  Regular Access:  PIV IVF:  Off Proph:  SCDs  Code Status: Full code Family Communication: Patient alone Disposition Plan: Pending PT/OT assessments, monitoring for bleeding while platelet count low,    Consultants:  Dr. Irene Limbo by phone   Procedures:  CXR  Antibiotics:  Vancomycin 10/8 >  Zosyn 10/8, then stop   Ceftaz 10/9 >    HPI/Subjective:  States that she has been weak and tired and she wants to go home but her family made her come to the hospital.  Denies bleeding, abnormal bruising.  Still has a terrible cough and feels a little SOB with exertion.    Objective: Filed Vitals:   03/24/15 0043 03/24/15 0219 03/24/15 0521 03/24/15 0848  BP: 146/58 167/72 127/48   Pulse: 72 84 73   Temp:  98.1 F (36.7 C) 98.6 F (37 C)   TempSrc:  Oral Oral   Resp: '20 20 18   '$ Height:  '5\' 3"'$  (1.6 m)    Weight:  61.6 kg (135 lb 12.9 oz)    SpO2: 97% 99% 96% 90%    Intake/Output Summary (Last 24 hours) at 03/24/15 1346 Last data filed at 03/24/15 1100  Gross per 24 hour  Intake    240 ml  Output      0 ml  Net    240 ml   Filed Weights   03/24/15 0219  Weight: 61.6 kg (135 lb 12.9 oz)   Body mass index is 24.06 kg/(m^2).  Exam:   General:  Adult female, No acute distress  HEENT:  NCAT, MMM  Cardiovascular:  RRR, nl S1, S2 no mrg, 2+ pulses, warm extremities  Respiratory:  Diminished bilateral BS at both bases with rales bilaterally at bases.  More diminished at right base than left with some bronchial breath sounds, no rhonchi, no increased WOB  Abdomen:   NABS, soft, NT/ND  MSK:   Normal tone and bulk, 1+ pitting RLE edema  Neuro:  Grossly intact  Data Reviewed: Basic Metabolic  Panel:  Recent Labs Lab 03/18/15 1103 03/21/15 1411 03/23/15 2025 03/24/15 0426  NA 135* 136 135 136  K 3.1* 3.4* 4.0 3.5  CL  --   --  104 107  CO2 20* 22 19* 20*  GLUCOSE 130 91 98 86  BUN 17.3 13.'5 9 7  '$ CREATININE 1.1 1.0 0.99 0.89  CALCIUM 8.4 8.2* 8.4* 7.8*  PHOS  --   --   --  1.6*   Liver Function Tests:  Recent Labs Lab 03/18/15 1103 03/21/15 1411 03/23/15 2025 03/24/15 0426  AST 26 27 33 27  ALT '21 18 20 16  '$ ALKPHOS 84 92 103 89  BILITOT 1.02 0.74 0.7 0.9  PROT 6.2* 6.2* 7.0 5.9*  ALBUMIN 3.3* 3.4* 4.0 3.3*   No results for input(s): LIPASE, AMYLASE in the last 168 hours. No results for input(s): AMMONIA in the last 168 hours. CBC:  Recent Labs Lab 03/18/15 1102 03/21/15 1411 03/23/15 2025 03/24/15 0426  WBC 1.5* 1.1* 10.2 9.2  NEUTROABS 0.9* 0.5* 7.3 6.1  HGB 8.1* 7.4* 9.5* 8.2*  HCT 24.1* 22.0* 26.9* 23.8*  MCV 104.9* 104.8* 99.6 100.4*  PLT 103* 67* 44* 43*    Recent Results (from the past 240 hour(s))  Urine culture     Status: None   Collection Time: 03/18/15 12:14 PM  Result Value Ref Range Status   Urine Culture, Routine Culture, Urine  Final    Comment: Final - ===== COLONY COUNT: ===== >=100,000 COLONIES/ML STAPHYLOCOCCUS SPECIES (COAGULASE NEGATIVE)  ------------------------------------------------------------------------ Rifampin and Gentamicin should not be used as single drugs for treatment of Staph infections.  ------------------------------------------------------------------------  STAPHYLOCOCCUS SPECIES (COAGULASE NEGATIVE)     OXACILLIN                        MIC      Resistant        >=4 ug/ml    CEFAZOLIN                        MIC      Resistant            ug/ml    GENTAMICIN                       MIC      Sensitive      <=0.5 ug/ml    CIPROFLOXACIN                    MIC      Resistant          4 ug/ml    LEVOFLOXACIN                     MIC      Indeterminate      4 ug/ml  NITROFURANTOIN                   MIC       Sensitive       <=16 ug/ml    TRIMETH/SULFA                    MIC      Sensitive       <=10 ug/ml    VANCOMYCIN                       MIC      Sensitive          2 ug/ml    RIFAMPIN                          MIC      Sensitive      <=0.5 ug/ml    TETRACYCLINE                     MIC      Sensitive          2 ug/ml END OF REPORT      Studies: Dg Chest 2 View  03/23/2015   CLINICAL DATA:  81 year old with current history of adenocarcinoma of the right lower lobe, presenting with acute worsening of a chronic cough.  EXAM: CHEST  2 VIEW  COMPARISON:  CT chest 02/11/2015 and earlier. PET-CT 11/13/2014. Chest x-rays 10/23/2014 and earlier.  FINDINGS: Cardiac silhouette moderately enlarged, unchanged. Thoracic aorta mildly tortuous and atherosclerotic. Hilar and mediastinal contours otherwise unremarkable. Airspace opacities in the right lower lobe consistent with the adenocarcinoma are improved since the most recent prior CT 02/11/2015. Emphysematous changes throughout both lungs as noted previously. Prominent bronchovascular markings diffusely, more so than on prior examinations. No new pulmonary parenchymal abnormalities otherwise. No pleural effusions. Pulmonary vascularity normal. Degenerative changes involving the thoracic spine.  IMPRESSION: 1. Mild to moderate changes of acute bronchitis and/or asthma superimposed upon COPD/emphysema. No acute cardiopulmonary disease otherwise. 2. Improvement in the airspace opacities in the right lower lobe shown on prior examinations to represent adenocarcinoma. 3. Stable cardiomegaly without evidence of pulmonary edema.   Electronically Signed   By: Evangeline Dakin M.D.   On: 03/23/2015 22:22   Dg Chest Port 1 View  03/24/2015   CLINICAL DATA:  Abnormal lung sounds at the bases. Cough. Follow-up.  EXAM: PORTABLE CHEST 1 VIEW  COMPARISON:  03/23/2015  FINDINGS: The heart is mildly enlarged. There is atherosclerosis of the aorta. There is persistent patchy  density at the right base consistent with resolving pneumonia. There is new mild patchy density at the left base consistent with mild left base pneumonia/ atelectasis. No apparent effusions. Upper lungs are clear.  IMPRESSION: Persistent patchy density at the right base consistent with resolving pneumonia in that region. Slightly worsened patchy density at the left base which could be atelectasis or new infiltrate in that region.   Electronically Signed   By: Nelson Chimes M.D.   On: 03/24/2015 09:45    Scheduled Meds: . feeding supplement (ENSURE ENLIVE)  237 mL Oral BID BM  . folic acid  1 mg Oral Daily  . guaiFENesin  600 mg Oral BID  . ipratropium  0.5 mg Nebulization Q6H  . levalbuterol  0.63 mg Nebulization Q6H  . levothyroxine  50 mcg Oral QAC breakfast  . potassium chloride SA  20 mEq Oral Daily  .  sodium chloride  3 mL Intravenous Q12H  . vancomycin  1,000 mg Intravenous Q24H   Continuous Infusions:   Principal Problem:   Acute bronchitis Active Problems:   Non-small cell cancer of right lung (H[facility])   UTI (urinary tract infection)   Dehydration   Chemotherapy induced diarrhea   DVT (deep venous thrombosis) (HCC)   Thrombocytopenia ([place])    Time spent: 30 min    Emaly Boschert, Margaret  [facility] Pager [phone]. If 7PM-7AM, please contact night-coverage at [email or web address], password [facility] 03/24/2015, 1:46 PM

## 2015-03-24 NOTE — Progress Notes (Signed)
ANTIBIOTIC CONSULT NOTE - FOLLOW UP  Pharmacy Consult for Ceftazidime, Vancomycin Indication: HCAP, UTI  Allergies  Allergen Reactions  . Corticosteroids   . Minocycline     Pt states became disoriented and had a stiff neck.    Patient Measurements: Height: '5\' 3"'$  (160 cm) Weight: 135 lb 12.9 oz (61.6 kg) IBW/kg (Calculated) : 52.4  Vital Signs: Temp: 97.4 F (36.3 C) (10/09 1430) Temp Source: Oral (10/09 1430) BP: 142/57 mmHg (10/09 1430) Pulse Rate: 73 (10/09 1430) Intake/Output from previous day:   Intake/Output from this shift: Total I/O In: 240 [P.O.:240] Out: -   Labs:  Recent Labs  03/23/15 2025 03/24/15 0426  WBC 10.2 9.2  HGB 9.5* 8.2*  PLT 44* 43*  CREATININE 0.99 0.89   Estimated Creatinine Clearance: 35.4 mL/min (by C-G formula based on Cr of 0.89). No results for input(s): VANCOTROUGH, VANCOPEAK, VANCORANDOM, GENTTROUGH, GENTPEAK, GENTRANDOM, TOBRATROUGH, TOBRAPEAK, TOBRARND, AMIKACINPEAK, AMIKACINTROU, AMIKACIN in the last 72 hours.   Assessment: 19 yoF admitted on 10/8 with weakness.  She had been recently diagnosed with DVT (started Xarelto), and coagulase-negative staph aureus UTI (prescribed Bactrim).  She was started on vancomycin and Zosyn on admission for UTI and HCAP.  Zosyn is changed to Ceftazidime for thrombocytopenia.  10/8 >>vancomycin >> 10/8 >> zosyn >> 10/9 10/9 >> Ceftazidime >>  Today, 03/24/2015: Afebrile WBC 9.2 SCr 0.89 with CrCl ~ 35 ml/min Note Platelets decreased to 43k  Goal of Therapy:  Vancomycin trough level 15-20 mcg/ml  Plan:   Ceftazidime 2g IV q12h  Continue Vancomycin 1g IV q24h.  Measure Vanc trough at steady state.  Follow up renal fxn, culture results, and clinical course.   Gretta Arab PharmD, BCPS Pager [phone] 03/24/2015 2:39 PM

## 2015-03-24 NOTE — Progress Notes (Signed)
ANTICOAGULATION CONSULT NOTE - Initial Consult  Pharmacy Consult for Xarelto Indication: DVT  Allergies  Allergen Reactions  . Corticosteroids   . Minocycline     Pt states became disoriented and had a stiff neck.    Patient Measurements: Height: '5\' 3"'$  (160 cm) Weight: 135 lb 12.9 oz (61.6 kg) IBW/kg (Calculated) : 52.4  Vital Signs: Temp: 97.4 F (36.3 C) (10/09 1430) Temp Source: Oral (10/09 1430) BP: 142/57 mmHg (10/09 1430) Pulse Rate: 73 (10/09 1430)  Labs:  Recent Labs  03/23/15 2025 03/24/15 0426  HGB 9.5* 8.2*  HCT 26.9* 23.8*  PLT 44* 43*  CREATININE 0.99 0.89    Estimated Creatinine Clearance: 35.4 mL/min (by C-G formula based on Cr of 0.89).   Medical History: Past Medical History  Diagnosis Date  . Hypertension   . External carotid artery stenosis     L side dx'd with doppler 2012  . UTI (lower urinary tract infection)   . Pneumonia   . Thyroid disease   . Cancer [facility])      Assessment: 44 yoF admitted on 10/8 with weakness, s/p chemo for Lung Ca. She had been recently diagnosed with DVT and started Xarelto PTA.  Xarelto was held initially due to low platelets but has been ok'd to resume by Oncologist as long as platelets remain > 30,000.  Pharmacy is consulted to resume Xarelto dosing on 10/9.  We will restart day #1 Xarelto today since she started Xarelto on 10/7 and took only one dose on 10/8 but vomited shortly after.  Today, 03/24/2015:  CBC:  Hgb low at 8.2, Plt low at 43k  SCr 0.89 with CrCl ~ 35 ml/min  No bleeding or complications reported.   Goal of Therapy:  Monitor platelets by anticoagulation protocol: Yes   Plan:  Xarelto 15 mg PO BID with meals x21 days, then '20mg'$  once daily with supper. Follow up renal function and CBC.  Gretta Arab PharmD, BCPS Pager [phone] 03/24/2015 3:08 PM

## 2015-03-24 NOTE — Progress Notes (Signed)
ANTIBIOTIC CONSULT NOTE - INITIAL  Pharmacy Consult for Zosyn/Vancomycin Indication: Coag Negative Staph UTI  Allergies  Allergen Reactions  . Corticosteroids   . Minocycline     Pt states became disoriented and had a stiff neck.    Patient Measurements: Height: '5\' 3"'$  (160 cm) Weight: 135 lb 12.9 oz (61.6 kg) IBW/kg (Calculated) : 52.4   Vital Signs: Temp: 98.1 F (36.7 C) (10/09 0219) Temp Source: Oral (10/09 0219) BP: 167/72 mmHg (10/09 0219) Pulse Rate: 84 (10/09 0219) Intake/Output from previous day:   Intake/Output from this shift:    Labs:  Recent Labs  03/21/15 1411 03/21/15 1411 03/23/15 2025  WBC 1.1*  --  10.2  HGB 7.4*  --  9.5*  PLT 67*  --  44*  CREATININE  --  1.0 0.99   Estimated Creatinine Clearance: 31.9 mL/min (by C-G formula based on Cr of 0.99). No results for input(s): VANCOTROUGH, VANCOPEAK, VANCORANDOM, GENTTROUGH, GENTPEAK, GENTRANDOM, TOBRATROUGH, TOBRAPEAK, TOBRARND, AMIKACINPEAK, AMIKACINTROU, AMIKACIN in the last 72 hours.   Microbiology: Recent Results (from the past 720 hour(s))  Urine culture     Status: None   Collection Time: 03/18/15 12:14 PM  Result Value Ref Range Status   Urine Culture, Routine Culture, Urine  Final    Comment: Final - ===== COLONY COUNT: ===== >=100,000 COLONIES/ML STAPHYLOCOCCUS SPECIES (COAGULASE NEGATIVE)  ------------------------------------------------------------------------ Rifampin and Gentamicin should not be used as single drugs for treatment of Staph infections.  ------------------------------------------------------------------------  STAPHYLOCOCCUS SPECIES (COAGULASE NEGATIVE)     OXACILLIN                        MIC      Resistant        >=4 ug/ml    CEFAZOLIN                        MIC      Resistant            ug/ml    GENTAMICIN                       MIC      Sensitive      <=0.5 ug/ml    CIPROFLOXACIN                    MIC      Resistant          4 ug/ml    LEVOFLOXACIN                      MIC      Indeterminate      4 ug/ml    NITROFURANTOIN                   MIC      Sensitive       <=16 ug/ml    TRIMETH/SULFA                    MIC      Sensitive       <=10 ug/ml    VANCOMYCIN                       MIC      Sensitive          2 ug/ml    RIFAMPIN  MIC      Sensitive      <=0.5 ug/ml    TETRACYCLINE                     MIC      Sensitive          2 ug/ml END OF REPORT     Medical History: Past Medical History  Diagnosis Date  . Hypertension   . External carotid artery stenosis     L side dx'd with doppler 2012  . UTI (lower urinary tract infection)   . Pneumonia   . Thyroid disease   . Cancer [facility])     Medications:  Prescriptions prior to admission  Medication Sig Dispense Refill Last Dose  . dexamethasone (DECADRON) 4 MG tablet 4 mg po bid the day before, day of and day after chemotherapy 40 tablet 1 Past Week at Unknown time  . doxazosin (CARDURA) 4 MG tablet Take 4 mg by mouth daily.    Past Month at Unknown time  . folic acid (FOLVITE) 1 MG tablet Take 1 tablet (1 mg total) by mouth daily. 30 tablet 4 03/22/2015 at Unknown time  . levothyroxine (SYNTHROID, LEVOTHROID) 50 MCG tablet Take 50 mcg by mouth daily before breakfast.   03/22/2015 at Unknown time  . losartan (COZAAR) 100 MG tablet Take 100 mg by mouth daily.   Past Month at Unknown time  . potassium chloride SA (K-DUR,KLOR-CON) 20 MEQ tablet Take 1 tablet (20 mEq total) by mouth daily. 10 tablet 0 03/23/2015 at Unknown time  . Rivaroxaban (XARELTO STARTER PACK) 15 & 20 MG TBPK Take as directed on package: Start with one '15mg'$  tablet by mouth twice a day with food. On Day 22, switch to one '20mg'$  tablet once a day with food. 51 each 0 03/22/2015 at Unknown time  . sulfamethoxazole-trimethoprim (BACTRIM DS,SEPTRA DS) 800-160 MG tablet Take 1 tablet by mouth 2 (two) times daily. 14 tablet 0 03/23/2015 at AM  . verapamil (CALAN-SR) 240 MG CR tablet Take 240 mg by mouth daily.    Past  Month at Unknown time  . HYDROcodone-acetaminophen (NORCO/VICODIN) 5-325 MG per tablet Take 1 tablet by mouth every 6 (six) hours as needed for moderate pain. (Patient not taking: Reported on 03/04/2015) 20 tablet 0 Not Taking at Unknown time  . prochlorperazine (COMPAZINE) 10 MG tablet Take 1 tablet (10 mg total) by mouth every 6 (six) hours as needed for nausea or vomiting. (Patient not taking: Reported on 03/04/2015) 30 tablet 0 Not Taking at Unknown time   Scheduled:  . folic acid  1 mg Oral Daily  . guaiFENesin  600 mg Oral BID  . ipratropium  0.5 mg Nebulization Q6H  . levalbuterol  0.63 mg Nebulization Q6H  . levothyroxine  50 mcg Oral QAC breakfast  . magnesium sulfate 1 - 4 g bolus IVPB  2 g Intravenous Once  . piperacillin-tazobactam (ZOSYN)  IV  3.375 g Intravenous 3 times per day  . potassium chloride SA  20 mEq Oral Daily  . Rivaroxaban  15 mg Oral BID WC  . [START ON 04/12/2015] rivaroxaban  20 mg Oral Q supper  . sodium chloride  3 mL Intravenous Q12H  . vancomycin  1,250 mg Intravenous Once  . vancomycin  1,000 mg Intravenous Q24H   Infusions:  . sodium chloride     Assessment: 77 yoF c/o generalized weakness s/p chemo for Lung Ca.  Vancomycin/Zosyn per Rx for UTI.   Goal  of Therapy:  Vancomycin trough level 15-20 mcg/ml  Plan:   Zosyn 3.375 Gm IV q8h EI  Vancomycin '1250mg'$  x1 then 1Gm IV q24h  F/u Scr/cultures/levels as needed  Lawana Pai R 03/24/2015,2:31 AM

## 2015-03-25 ENCOUNTER — Ambulatory Visit: Payer: Medicare Other

## 2015-03-25 ENCOUNTER — Other Ambulatory Visit: Payer: Medicare Other

## 2015-03-25 ENCOUNTER — Telehealth: Payer: Self-pay

## 2015-03-25 DIAGNOSIS — T451X5A Adverse effect of antineoplastic and immunosuppressive drugs, initial encounter: Secondary | ICD-10-CM

## 2015-03-25 DIAGNOSIS — I824Z1 Acute embolism and thrombosis of unspecified deep veins of right distal lower extremity: Secondary | ICD-10-CM

## 2015-03-25 DIAGNOSIS — K521 Toxic gastroenteritis and colitis: Secondary | ICD-10-CM

## 2015-03-25 DIAGNOSIS — J189 Pneumonia, unspecified organism: Principal | ICD-10-CM

## 2015-03-25 DIAGNOSIS — D696 Thrombocytopenia, unspecified: Secondary | ICD-10-CM

## 2015-03-25 DIAGNOSIS — E440 Moderate protein-calorie malnutrition: Secondary | ICD-10-CM | POA: Insufficient documentation

## 2015-03-25 LAB — TYPE AND SCREEN
ABO/RH(D): A POS
ANTIBODY SCREEN: NEGATIVE
UNIT DIVISION: 0
Unit division: 0

## 2015-03-25 LAB — CBC WITH DIFFERENTIAL/PLATELET
BASOS PCT: 0 %
Basophils Absolute: 0 10*3/uL (ref 0.0–0.1)
Eosinophils Absolute: 0.1 10*3/uL (ref 0.0–0.7)
Eosinophils Relative: 1 %
HEMATOCRIT: 25.5 % — AB (ref 36.0–46.0)
HEMOGLOBIN: 8.7 g/dL — AB (ref 12.0–15.0)
LYMPHS PCT: 23 %
Lymphs Abs: 1.7 10*3/uL (ref 0.7–4.0)
MCH: 34.8 pg — AB (ref 26.0–34.0)
MCHC: 34.1 g/dL (ref 30.0–36.0)
MCV: 102 fL — ABNORMAL HIGH (ref 78.0–100.0)
MONOS PCT: 23 %
Monocytes Absolute: 1.7 10*3/uL — ABNORMAL HIGH (ref 0.1–1.0)
NEUTROS ABS: 3.8 10*3/uL (ref 1.7–7.7)
Neutrophils Relative %: 53 %
Platelets: 56 10*3/uL — ABNORMAL LOW (ref 150–400)
RBC: 2.5 MIL/uL — ABNORMAL LOW (ref 3.87–5.11)
RDW: 16.7 % — ABNORMAL HIGH (ref 11.5–15.5)
WBC: 7.3 10*3/uL (ref 4.0–10.5)

## 2015-03-25 LAB — BASIC METABOLIC PANEL
ANION GAP: 9 (ref 5–15)
BUN: 7 mg/dL (ref 6–20)
CO2: 20 mmol/L — ABNORMAL LOW (ref 22–32)
Calcium: 8.3 mg/dL — ABNORMAL LOW (ref 8.9–10.3)
Chloride: 108 mmol/L (ref 101–111)
Creatinine, Ser: 0.86 mg/dL (ref 0.44–1.00)
GFR calc Af Amer: >60 mL/min (ref >60)
GFR calc non Af Amer: 58 mL/min — ABNORMAL LOW (ref >60)
GLUCOSE: 87 mg/dL (ref 65–99)
POTASSIUM: 3.7 mmol/L (ref 3.5–5.1)
Sodium: 137 mmol/L (ref 135–145)

## 2015-03-25 LAB — HIV ANTIBODY (ROUTINE TESTING W REFLEX): HIV Screen 4th Generation wRfx: NONREACTIVE

## 2015-03-25 LAB — URINE CULTURE: Culture: NO GROWTH

## 2015-03-25 LAB — STREP PNEUMONIAE URINARY ANTIGEN: Strep Pneumo Urinary Antigen: NEGATIVE

## 2015-03-25 MED ORDER — LEVALBUTEROL HCL 0.63 MG/3ML IN NEBU
0.6300 mg | INHALATION_SOLUTION | Freq: Three times a day (TID) | RESPIRATORY_TRACT | Status: DC
  Filled 2015-03-25: qty 3

## 2015-03-25 MED ORDER — IPRATROPIUM BROMIDE 0.02 % IN SOLN
0.5000 mg | Freq: Three times a day (TID) | RESPIRATORY_TRACT | Status: DC
  Filled 2015-03-25: qty 2.5

## 2015-03-25 MED ORDER — AMOXICILLIN 500 MG PO CAPS: 1000.0000 mg | ORAL_CAPSULE | Freq: Three times a day (TID) | ORAL | Status: DC

## 2015-03-25 NOTE — Evaluation (Signed)
Physical Therapy Evaluation Patient Details Name: [name] Carlson MRN: [id number] DOB: [birth year] Today's Date: 03/25/2015   History of Present Illness  81 yo female admitted with Pna, R LE DVT. Hxof HTN, lung cancer-undergoing chemo currently. Pt is from home alone.   Clinical Impression  On eval, pt required Min assist for mobility-walked ~90 feet with 1 HHA. Pt is unsteady even with assist. Limited distance due to weakness, fatigue. Recommend HHPT, [place], [place]. Encouraged pt to have daughters assist more at home and to discuss having them stay with her (or her with them) if at all possible.     Follow Up Recommendations Home health PT;Supervision - Intermittent (at a minimum); Home Health Aide    Equipment Recommendations  Rolling walker with 5" wheels    Recommendations for Other Services OT consult     Precautions / Restrictions Precautions Precautions: Fall Restrictions Weight Bearing Restrictions: No      Mobility  Bed Mobility               General bed mobility comments: pt oob in recliner  Transfers Overall transfer level: Needs assistance Equipment used: None Transfers: Sit to/from Stand Sit to Stand: Min guard         General transfer comment: close guard. Pt is unsteady.   Ambulation/Gait Ambulation/Gait assistance: Min assist Ambulation Distance (Feet): 90 Feet Assistive device: 1 person hand held assist Gait Pattern/deviations: Decreased stride length;Step-through pattern;Decreased step length - left;Decreased step length - right     General Gait Details: Unsteady even with 1 HHA at times. slow gait speed. fatigues fairly easily.   Stairs            Wheelchair Mobility    Modified Rankin (Stroke Patients Only)       Balance Overall balance assessment: Needs assistance         Standing balance support: During functional activity Standing balance-Leahy Scale: Fair                                Pertinent Vitals/Pain Pain Assessment: No/denies pain    Home Living Family/patient expects to be discharged to:: Private residence Living Arrangements: Alone Available Help at Discharge: Family;Available PRN/intermittently Type of Home: House Home Access: Stairs to enter Entrance Stairs-Rails: Right Entrance Stairs-Number of Steps: 3 Home Layout: Able to live on main level with bedroom/bathroom Home Equipment: Cane - single point      Prior Function Level of Independence: Independent with assistive device(s)         Comments: uses cane for ambulation     Hand Dominance        Extremity/Trunk Assessment   Upper Extremity Assessment: Defer to OT evaluation           Lower Extremity Assessment: Generalized weakness      Cervical / Trunk Assessment: Normal  Communication   Communication: No difficulties  Cognition Arousal/Alertness: Awake/alert Behavior During Therapy: WFL for tasks assessed/performed Overall Cognitive Status: Within Functional Limits for tasks assessed                      General Comments      Exercises        Assessment/Plan    PT Assessment Patient needs continued PT services  PT Diagnosis Difficulty walking;Generalized weakness   PT Problem List Decreased strength;Decreased activity tolerance;Decreased balance;Decreased mobility;Decreased knowledge of use of DME  PT Treatment Interventions DME instruction;Gait  training;Functional mobility training;Therapeutic activities;Patient/family education;Balance training;Therapeutic exercise   PT Goals (Current goals can be found in the Care Plan section) Acute Rehab PT Goals Patient Stated Goal: none stated PT Goal Formulation: With patient Time For Goal Achievement: 04/08/15 Potential to Achieve Goals: Good    Frequency Min 3X/week   Barriers to discharge        Co-evaluation               End of Session Equipment Utilized During Treatment: Gait  belt Activity Tolerance: Patient limited by fatigue Patient left: in chair;with call bell/phone within reach           Time: 8527-7824 PT Time Calculation (min) (ACUTE ONLY): 9 min   Charges:   PT Evaluation $Initial PT Evaluation Tier I: 1 Procedure     PT G Codes:        Weston Anna, MPT Pager: [phone]

## 2015-03-25 NOTE — Telephone Encounter (Signed)
Pt has worsening cough and bad UTI. Daughter is concerned that mother was not drinking at home. She questioned when she would be sent home. Encouraged daughter to call hospital RN and discuss blood transfusions, hydration, antibiotics and discharge questions. Daughter will call [facility] once pt dc'd to set up new appts.

## 2015-03-25 NOTE — Progress Notes (Signed)
Spoke with pt concerning HH and discharge needs. Pt had no preference, referral given to [facility].

## 2015-03-25 NOTE — Discharge Summary (Signed)
Physician Discharge Summary  [name] Carlson OZH:[id number] DOB: 06-26-1922 DOA: 03/23/2015  PCP: Criselda Peaches, MD  Admit date: 03/23/2015 Discharge date: 03/25/2015  Recommendations for Outpatient Follow-up:  1. Follow-up with oncology within 2-3 days for repeat CBC to check hemoglobin, platelets.   2. Started on amoxicillin in addition to her Bactrim for treatment of possible pneumonia.    Discharge Diagnoses:  Principal Problem:   Acute bronchitis Active Problems:   Non-small cell cancer of right lung (HCC)   UTI (urinary tract infection)   Dehydration   Chemotherapy induced diarrhea   DVT (deep venous thrombosis) (HCC)   Thrombocytopenia (HCC)   CAP (community acquired pneumonia)   Malnutrition of moderate degree   Discharge Condition: Stable, improved  Diet recommendation: Regular  Wt Readings from Last 3 Encounters:  03/24/15 61.6 kg (135 lb 12.9 oz)  03/22/15 63.73 kg (140 lb 8 oz)  03/21/15 63.549 kg (140 lb 1.6 oz)    History of present illness:   The patient is an 81 year old female with history of hypertension, lung cancer, hypothyroidism, DVT diagnosed the day prior to admission just started on Xarelto, urinary tract infection who presented with progressive weakness, decreased appetite, fatigue, nausea, vomiting, and diarrhea for 2 weeks. The symptoms are typical after she receives her chemotherapy. She denied abdominal pain, dysuria, or hematuria. She has been unable to tolerate her Bactrim secondary to nausea, and vomiting.  She denied fever, worsening shortness of breath however she had a more productive cough over the last 2 weeks.  Hospital Course:   Community-acquired pneumonia, chest x-ray demonstrated a possible infiltrate at the left lower lobe. She had coarse rales and diminished breath sounds in the same area on exam. She was initially started on broad-spectrum antibiotics, however we will transition to oral antibiotics at discharge to complete a  seven-day course. Blood cultures are no growth to date. Strep pneumo urinary antigen was negative, and Legionella antigen is pending. HIV PCR is pending.  She was breathing comfortably on room air and able to ambulate with oxygen saturations maintained greater than 88% prior to discharge.  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 Julien Nordmann.  Her last chemotherapy was approximately 2 weeks prior to admission.  Per patient and family, she is not going to be receiving any further chemotherapy.  Acute right DVT of the soleal vein, started on Xarelto. She had a dose of Xarelto held secondary to thrombocytopenia, however she was able to resume her Xarelto on the evening of 10/9.  Coag negative staph urinary tract infection. She should continue her Bactrim as previously prescribed.  Dehydration, improved with IV fluids.  Chemotherapy-induced diarrhea, nausea, vomiting, improved during hospitalization with IV fluids, antiemetics.  She was able to tolerate meals prior to discharge.  She met with the nutritionist who identified moderate protein-calorie malnutrition.  She was encouraged to use dietary supplements.    Thrombocytopenia, anemia secondary to chemotherapy. She was recently pancytopenic secondary to chemotherapy however she received several doses of Granix and therefore is no longer neutropenic at the time of this admission. Her platelet count nadired at 43 on 10/9 and trended up to 56,000 on the date of discharge.  Generalized weakness secondary to malignancy, chemotherapy, DVT, urinary tract infection. She met with physical therapy recommended home health services and a rolling walker. The case manager is setting up home health services and equipment prior to discharge.   Essential hypertension, blood pressure goal of SBP 150 given age and  comorbidities.  Agree with holding blood pressure medications and monitoring blood only.    Consultants:  Dr.  Irene Limbo by phone  Procedures:  CXR  Antibiotics:  Vancomycin 10/8 >  Zosyn 10/8, then stop   Ceftaz 10/9 >  Discharge Exam: Filed Vitals:   03/25/15 0607  BP: 145/67  Pulse: 20  Temp: 98 F (36.7 C)  Resp: 19   Filed Vitals:   03/24/15 2043 03/24/15 2208 03/25/15 0607 03/25/15 0841  BP:  166/65 145/67   Pulse:  73 20   Temp:  97.8 F (36.6 C) 98 F (36.7 C)   TempSrc:  Oral Oral   Resp:  18 19   Height:      Weight:      SpO2: 97% 97% 89% 95%     General: Adult female, No acute distress  HEENT: NCAT, MMM  Cardiovascular: RRR, nl S1, S2 no mrg, 2+ pulses, warm extremities  Respiratory: Diminished bilateral BS at both bases with rales bilaterally at bases. More diminished at right base than left with some bronchial breath sounds, no rhonchi, no increased WOB  Abdomen: NABS, soft, NT/ND  MSK: Normal tone and bulk, 1+ pitting RLE edema  Neuro: Grossly intact  Discharge Instructions      Discharge Instructions    Call MD for:  difficulty breathing, headache or visual disturbances    Complete by:  As directed      Call MD for:  extreme fatigue    Complete by:  As directed      Call MD for:  hives    Complete by:  As directed      Call MD for:  persistant dizziness or light-headedness    Complete by:  As directed      Call MD for:  persistant nausea and vomiting    Complete by:  As directed      Call MD for:  severe uncontrolled pain    Complete by:  As directed      Call MD for:  temperature >100.4    Complete by:  As directed      Diet general    Complete by:  As directed      Increase activity slowly    Complete by:  As directed             Medication List    STOP taking these medications        doxazosin 4 MG tablet  Commonly known as:  CARDURA     losartan 100 MG tablet  Commonly known as:  COZAAR     verapamil 240 MG CR tablet  Commonly known as:  CALAN-SR      TAKE these medications        amoxicillin 500 MG  capsule  Commonly known as:  AMOXIL  Take 2 capsules (1,000 mg total) by mouth 3 (three) times daily.     dexamethasone 4 MG tablet  Commonly known as:  DECADRON  4 mg po bid the day before, day of and day after chemotherapy     folic acid 1 MG tablet  Commonly known as:  FOLVITE  Take 1 tablet (1 mg total) by mouth daily.     HYDROcodone-acetaminophen 5-325 MG tablet  Commonly known as:  NORCO/VICODIN  Take 1 tablet by mouth every 6 (six) hours as needed for moderate pain.     levothyroxine 50 MCG tablet  Commonly known as:  SYNTHROID, LEVOTHROID  Take 50 mcg by mouth daily before  breakfast.     potassium chloride SA 20 MEQ tablet  Commonly known as:  K-DUR,KLOR-CON  Take 1 tablet (20 mEq total) by mouth daily.     prochlorperazine 10 MG tablet  Commonly known as:  COMPAZINE  Take 1 tablet (10 mg total) by mouth every 6 (six) hours as needed for nausea or vomiting.     Rivaroxaban 15 & 20 MG Tbpk  Commonly known as:  XARELTO STARTER PACK  Take as directed on package: Start with one 53m tablet by mouth twice a day with food. On Day 22, switch to one 29mtablet once a day with food.     sulfamethoxazole-trimethoprim 800-160 MG tablet  Commonly known as:  BACTRIM DS,SEPTRA DS  Take 1 tablet by mouth 2 (two) times daily.       Follow-up Information    Schedule an appointment as soon as possible for a visit with GREEN, EDKeenan BachelorMD.   Specialty:  Internal Medicine   Why:  As needed   Contact information:   [address] [place] [phone]     Follow up with MOEilleen Kempf MD In 2 days.   Specialty:  Oncology   Contact information:   [phone]      The results of significant diagnostics from this hospitalization (including imaging, microbiology, ancillary and laboratory) are listed below for reference.    Significant Diagnostic Studies: Dg Chest 2 View  03/23/2015   CLINICAL  DATA:  [age]ear old with current history of adenocarcinoma of the right lower lobe, presenting with acute worsening of a chronic cough.  EXAM: CHEST  2 VIEW  COMPARISON:  CT chest 02/11/2015 and earlier. PET-CT 11/13/2014. Chest x-rays 10/23/2014 and earlier.  FINDINGS: Cardiac silhouette moderately enlarged, unchanged. Thoracic aorta mildly tortuous and atherosclerotic. Hilar and mediastinal contours otherwise unremarkable. Airspace opacities in the right lower lobe consistent with the adenocarcinoma are improved since the most recent prior CT 02/11/2015. Emphysematous changes throughout both lungs as noted previously. Prominent bronchovascular markings diffusely, more so than on prior examinations. No new pulmonary parenchymal abnormalities otherwise. No pleural effusions. Pulmonary vascularity normal. Degenerative changes involving the thoracic spine.  IMPRESSION: 1. Mild to moderate changes of acute bronchitis and/or asthma superimposed upon COPD/emphysema. No acute cardiopulmonary disease otherwise. 2. Improvement in the airspace opacities in the right lower lobe shown on prior examinations to represent adenocarcinoma. 3. Stable cardiomegaly without evidence of pulmonary edema.   Electronically Signed   By: ThEvangeline Dakin.D.   On: 03/23/2015 22:22   Dg Chest Port 1 View  03/24/2015   CLINICAL DATA:  Abnormal lung sounds at the bases. Cough. Follow-up.  EXAM: PORTABLE CHEST 1 VIEW  COMPARISON:  03/23/2015  FINDINGS: The heart is mildly enlarged. There is atherosclerosis of the aorta. There is persistent patchy density at the right base consistent with resolving pneumonia. There is new mild patchy density at the left base consistent with mild left base pneumonia/ atelectasis. No apparent effusions. Upper lungs are clear.  IMPRESSION: Persistent patchy density at the right base consistent with resolving pneumonia in that region. Slightly worsened patchy density at the left base which could be atelectasis or  new infiltrate in that region.   Electronically Signed   By: MaNelson Chimes.D.   On: 03/24/2015 09:45    Microbiology: Recent Results (from the past 240 hour(s))  Urine culture     Status: None   Collection  Time: 03/18/15 12:14 PM  Result Value Ref Range Status   Urine Culture, Routine Culture, Urine  Final    Comment: Final - ===== COLONY COUNT: ===== >=100,000 COLONIES/ML STAPHYLOCOCCUS SPECIES (COAGULASE NEGATIVE)  ------------------------------------------------------------------------ Rifampin and Gentamicin should not be used as single drugs for treatment of Staph infections.  ------------------------------------------------------------------------  STAPHYLOCOCCUS SPECIES (COAGULASE NEGATIVE)     OXACILLIN                        MIC      Resistant        >=4 ug/ml    CEFAZOLIN                        MIC      Resistant            ug/ml    GENTAMICIN                       MIC      Sensitive      <=0.5 ug/ml    CIPROFLOXACIN                    MIC      Resistant          4 ug/ml    LEVOFLOXACIN                     MIC      Indeterminate      4 ug/ml    NITROFURANTOIN                   MIC      Sensitive       <=16 ug/ml    TRIMETH/SULFA                    MIC      Sensitive       <=10 ug/ml    VANCOMYCIN                       MIC      Sensitive          2 ug/ml    RIFAMPIN                          MIC      Sensitive      <=0.5 ug/ml    TETRACYCLINE                     MIC      Sensitive          2 ug/ml END OF REPORT   Urine culture     Status: None   Collection Time: 03/23/15 11:40 PM  Result Value Ref Range Status   Specimen Description URINE, CATHETERIZED  Final   Special Requests NONE  Final   Culture   Final    NO GROWTH 1 DAY Performed at [facility]    Report Status 03/25/2015 FINAL  Final     Labs: Basic Metabolic Panel:  Recent Labs Lab 03/21/15 1411 03/23/15 2025 03/24/15 0426 03/25/15 0555  NA 136 135 136 137  K 3.4* 4.0 3.5 3.7  CL  --   104 107 108  CO2 22 19* 20* 20*  GLUCOSE 91 98 86 87  BUN 13._0 7  CREATININE 1.0 0.99 0.89 0.86  CALCIUM 8.2* 8.4* 7.8* 8.3*  PHOS  --   --  1.6*  --    Liver Function Tests:  Recent Labs Lab 03/21/15 1411 03/23/15 2025 03/24/15 0426  AST 27 33 27  ALT _0 ALKPHOS 92 103 89  BILITOT 0.74 0.7 0.9  PROT 6.2* 7.0 5.9*  ALBUMIN 3.4* 4.0 3.3*   No results for input(s): LIPASE, AMYLASE in the last 168 hours. No results for input(s): AMMONIA in the last 168 hours. CBC:  Recent Labs Lab 03/21/15 1411 03/23/15 2025 03/24/15 0426 03/25/15 0555  WBC 1.1* 10.2 9.2 7.3  NEUTROABS 0.5* 7.3 6.1 3.8  HGB 7.4* 9.5* 8.2* 8.7*  HCT 22.0* 26.9* 23.8* 25.5*  MCV 104.8* 99.6 100.4* 102.0*  PLT 67* 44* 43* 56*   Cardiac Enzymes: No results for input(s): CKTOTAL, CKMB, CKMBINDEX, TROPONINI in the last 168 hours. BNP: BNP (last 3 results) No results for input(s): BNP in the last 8760 hours.  ProBNP (last 3 results) No results for input(s): PROBNP in the last 8760 hours.  CBG: No results for input(s): GLUCAP in the last 168 hours.  Time coordinating discharge: 35 minutes  Signed:  Pheonix Wisby  [facility] 03/25/2015, 12:49 PM

## 2015-03-25 NOTE — Progress Notes (Signed)
Initial Nutrition Assessment  DOCUMENTATION CODES:   Non-severe (moderate) malnutrition in context of chronic illness  INTERVENTION:  - Continue Ensure Enlive BID, each supplement provides 350 kcal and 20 grams of protein - RD will continue to monitor for needs  NUTRITION DIAGNOSIS:   Increased nutrient needs related to catabolic illness, cancer and cancer related treatments as evidenced by estimated needs.  GOAL:   Patient will meet greater than or equal to 90% of their needs  MONITOR:   PO intake, Supplement acceptance, Weight trends, Labs, I & O's  REASON FOR ASSESSMENT:   Malnutrition Screening Tool  ASSESSMENT:   81 y.o. female with a past medical history of hypertension, lung cancer, hypothyroidism, DVT on Xarelto, currently being treated for a UTI who comes with postchemotherapy progressively worse weakness, decreased appetite, fatigue, nausea, emesis and diarrhea for 2 weeks. She denies abdominal pain, dysuria or hematuria. While following at the cancer center, patient was diagnosed earlier this week with a coagulase-negative staph aureus UTI and the right lower extremity DVT. She was started on Bactrim and Xarelto. She received a blood transfusion due to anemia with leukopenia. She stopped her antihypertensives due to decreased oral intake that was causing her to be lightheaded. She denies fever, but complains of chills and fatigue, no headache, no earaches, no sore throat. However, she states that her chronic cough has become more productive over the last 2 weeks.   Pt seen for MST. BMI indicates normal weight status. Per chart review, pt ate 75% breakfast and 100% lunch yesterday. She states she ate ~100% 2 scrambled eggs and 4 pieces bacon and a few bites of yogurt for breakfast this AM. She denies abdominal pain, nausea following breakfast. She states for the past few weeks PTA she has had decreased appetite and intakes. She states she lives alone and does not cook which  also contributes to not eating as much.   At home she was drinking Boost variably. Some days she would drink part of one and some days she would not drink any due to feeling full. She states that she feels full quickly and is surprised she has been eating as much as she has during hospitalization. She denies offer to order Boost/switch Ensure to Boost. Pt currently on chemotherapy and she states she is experiencing taste alteration in the form of lack of taste. Last chemo: 03/11/15.  She states that she was dx with lung cancer 1.5-2 years ago and has lost 11 lbs in this time frame. Per chart review, pt has lost 15 lbs (10% body weight) in the past 3 months which is significant for time frame. No notable muscle or fat wasting at this time.  Meeting needs minimally. Medications reviewed. Labs reviewed; Ca: 8.3 mg/dL.   Diet Order:  Diet regular Room service appropriate?: Yes; Fluid consistency:: Thin  Skin:  Reviewed, no issues  Last BM:  10/8  Height:   Ht Readings from Last 1 Encounters:  03/24/15 '5\' 3"'$  (1.6 m)    Weight:   Wt Readings from Last 1 Encounters:  03/24/15 135 lb 12.9 oz (61.6 kg)    Ideal Body Weight:  52.27 kg (kg)  BMI:  Body mass index is 24.06 kg/(m^2).  Estimated Nutritional Needs:   Kcal:  1850-2050  Protein:  70-80 grams  Fluid:  2-2.2 L/day  EDUCATION NEEDS:   No education needs identified at this time     Jarome Matin, RD, LDN Inpatient Clinical Dietitian Pager # [phone] After hours/weekend pager #  [phone]  

## 2015-03-25 NOTE — Telephone Encounter (Signed)
Daughter called stating pt is in the hospital. She has appt today for lab at [facility] and tomorrow for blood.

## 2015-03-25 NOTE — Evaluation (Signed)
Occupational Therapy Evaluation Patient Details Name: [name] Carlson MRN: [id number] DOB: 1922/08/24 Today's Date: 03/25/2015    History of Present Illness 81 yo female admitted with Pna, R LE DVT. Hxof HTN, lung cancer-undergoing chemo currently. Pt is from home alone.    Clinical Impression    Pt plans to DC home today. OT educated pt that she needs her daughter to stay with her for safety initially.    Follow Up Recommendations  Home health OT;Supervision/Assistance - 24 hour    Equipment Recommendations  None recommended by OT    Recommendations for Other Services       Precautions / Restrictions Precautions Precautions: Fall Restrictions Weight Bearing Restrictions: No      Mobility Bed Mobility Overal bed mobility: Modified Independent             General bed mobility comments: pt oob in recliner  Transfers Overall transfer level: Needs assistance Equipment used: Rolling walker (2 wheeled) Transfers: Sit to/from Stand Sit to Stand: Min guard         General transfer comment: close guard. Pt is unsteady.     Balance Overall balance assessment: Needs assistance         Standing balance support: During functional activity Standing balance-Leahy Scale: Fair                              ADL Overall ADL's : Needs assistance/impaired     Grooming: Set up;Standing   Upper Body Bathing: Set up;Sitting   Lower Body Bathing: Minimal assistance;Sit to/from stand   Upper Body Dressing : Set up;Sitting   Lower Body Dressing: Minimal assistance;Sit to/from stand   Toilet Transfer: Min guard;Ambulation;RW   Toileting- Clothing Manipulation and Hygiene: Sit to/from stand;Min guard         General ADL Comments: Suggested to pt her daughter stay with her for a few days when she goes home .               Pertinent Vitals/Pain Pain Assessment: No/denies pain        Extremity/Trunk Assessment Upper Extremity  Assessment Upper Extremity Assessment: Generalized weakness   Lower Extremity Assessment Lower Extremity Assessment: Generalized weakness   Cervical / Trunk Assessment Cervical / Trunk Assessment: Normal   Communication Communication Communication: No difficulties   Cognition Arousal/Alertness: Awake/alert Behavior During Therapy: WFL for tasks assessed/performed Overall Cognitive Status: Within Functional Limits for tasks assessed                     General Comments   pt will need daughter to be with her initially for safety and fall prevention            Home Living Family/patient expects to be discharged to:: Private residence Living Arrangements: Alone Available Help at Discharge: Family;Available PRN/intermittently Type of Home: House Home Access: Stairs to enter [employer] of Steps: 3 Entrance Stairs-Rails: Right Home Layout: Able to live on main level with bedroom/bathroom     Bathroom Shower/Tub: Teacher, early years/pre: Standard     Home Equipment: Cane - single point          Prior Functioning/Environment Level of Independence: Independent with assistive device(s)        Comments: uses cane for ambulation    OT Diagnosis: Generalized weakness   OT Problem List: Decreased strength;Decreased activity tolerance;Impaired balance (sitting and/or standing)   OT Treatment/Interventions:  OT Goals(Current goals can be found in the care plan section) Acute Rehab OT Goals Patient Stated Goal: go home  OT Frequency:                End of Session Equipment Utilized During Treatment: Rolling walker Nurse Communication: Mobility status  Activity Tolerance: Patient limited by fatigue Patient left: in bed   Time: 1357-1417 OT Time Calculation (min): 20 min Charges:  OT General Charges $OT Visit: 1 Procedure OT Evaluation $Initial OT Evaluation Tier I: 1 Procedure G-Codes:    Betsy Pries 18-Apr-2015,  2:21 PM

## 2015-03-26 ENCOUNTER — Encounter (HOSPITAL_COMMUNITY): Payer: Medicare Other

## 2015-03-26 ENCOUNTER — Telehealth: Payer: Self-pay | Admitting: Internal Medicine

## 2015-03-26 NOTE — Telephone Encounter (Signed)
returned pt call and confirmed appt....ok and aware °

## 2015-03-29 LAB — CULTURE, BLOOD (ROUTINE X 2)
CULTURE: NO GROWTH
CULTURE: NO GROWTH

## 2015-04-01 ENCOUNTER — Telehealth: Payer: Self-pay | Admitting: Internal Medicine

## 2015-04-01 ENCOUNTER — Other Ambulatory Visit (HOSPITAL_BASED_OUTPATIENT_CLINIC_OR_DEPARTMENT_OTHER): Payer: Medicare Other

## 2015-04-01 ENCOUNTER — Ambulatory Visit (HOSPITAL_BASED_OUTPATIENT_CLINIC_OR_DEPARTMENT_OTHER): Payer: Medicare Other | Admitting: Oncology

## 2015-04-01 ENCOUNTER — Ambulatory Visit: Payer: Medicare Other

## 2015-04-01 ENCOUNTER — Encounter: Payer: Self-pay | Admitting: Oncology

## 2015-04-01 VITALS — BP 132/67 | HR 109 | Temp 97.8°F | Resp 18 | Ht 63.0 in | Wt 133.5 lb

## 2015-04-01 DIAGNOSIS — Z7901 Long term (current) use of anticoagulants: Secondary | ICD-10-CM | POA: Diagnosis not present

## 2015-04-01 DIAGNOSIS — C3491 Malignant neoplasm of unspecified part of right bronchus or lung: Secondary | ICD-10-CM

## 2015-04-01 DIAGNOSIS — N289 Disorder of kidney and ureter, unspecified: Secondary | ICD-10-CM

## 2015-04-01 DIAGNOSIS — C3431 Malignant neoplasm of lower lobe, right bronchus or lung: Secondary | ICD-10-CM

## 2015-04-01 DIAGNOSIS — Z86718 Personal history of other venous thrombosis and embolism: Secondary | ICD-10-CM | POA: Diagnosis not present

## 2015-04-01 LAB — COMPREHENSIVE METABOLIC PANEL (CC13)
ALBUMIN: 3.9 g/dL (ref 3.5–5.0)
ALT: 27 U/L (ref 0–55)
AST: 36 U/L — ABNORMAL HIGH (ref 5–34)
Alkaline Phosphatase: 112 U/L (ref 40–150)
Anion Gap: 12 mEq/L — ABNORMAL HIGH (ref 3–11)
BILIRUBIN TOTAL: 0.55 mg/dL (ref 0.20–1.20)
BUN: 15.4 mg/dL (ref 7.0–26.0)
CO2: 17 meq/L — AB (ref 22–29)
CREATININE: 1.3 mg/dL — AB (ref 0.6–1.1)
Calcium: 9.7 mg/dL (ref 8.4–10.4)
Chloride: 105 mEq/L (ref 98–109)
EGFR: 35 mL/min/{1.73_m2} — ABNORMAL LOW (ref >90)
GLUCOSE: 147 mg/dL — AB (ref 70–140)
Potassium: 5 mEq/L (ref 3.5–5.1)
SODIUM: 133 meq/L — AB (ref 136–145)
TOTAL PROTEIN: 7.2 g/dL (ref 6.4–8.3)

## 2015-04-01 LAB — CBC WITH DIFFERENTIAL/PLATELET
BASO%: 0.6 % (ref 0.0–2.0)
Basophils Absolute: 0 10*3/uL (ref 0.0–0.1)
EOS%: 0.4 % (ref 0.0–7.0)
Eosinophils Absolute: 0 10*3/uL (ref 0.0–0.5)
HCT: 31.5 % — ABNORMAL LOW (ref 34.8–46.6)
HEMOGLOBIN: 10.5 g/dL — AB (ref 11.6–15.9)
LYMPH%: 19.7 % (ref 14.0–49.7)
MCH: 35.1 pg — ABNORMAL HIGH (ref 25.1–34.0)
MCHC: 33.4 g/dL (ref 31.5–36.0)
MCV: 105.2 fL — ABNORMAL HIGH (ref 79.5–101.0)
MONO#: 1.2 10*3/uL — AB (ref 0.1–0.9)
MONO%: 20.4 % — AB (ref 0.0–14.0)
NEUT%: 58.9 % (ref 38.4–76.8)
NEUTROS ABS: 3.4 10*3/uL (ref 1.5–6.5)
Platelets: 384 10*3/uL (ref 145–400)
RBC: 3 10*6/uL — AB (ref 3.70–5.45)
RDW: 19.2 % — AB (ref 11.2–14.5)
WBC: 5.7 10*3/uL (ref 3.9–10.3)
lymph#: 1.1 10*3/uL (ref 0.9–3.3)

## 2015-04-01 NOTE — Progress Notes (Signed)
[facility] [phone]   [phone]  OFFICE PROGRESS NOTE  GREEN, [name], MD [address] [place] [address]  DIAGNOSIS: Stage IIIA (T3, N1, M0) non-small cell lung cancer, adenocarcinoma with negative EGFR mutation and negative for gene translocation diagnosed in May 2016 in a patient with remote history of smoking. She has multiple satellite nodules that would place her at a higher stage.  GUARDANT 360 Molecular study: positive for BRCA2, NOTCH1, RAF1 and NF![name]  PRIOR THERAPY: None  CURRENT THERAPY: Systemic chemotherapy with carboplatin for an AUC of 5 and Alimta at 500 mg/m given every 3 weeks. Status post 5 cycles.  INTERVAL HISTORY: [name] Carlson 81 y.o. female returns to the clinic today for follow-up visit, accompanied by her daughter. The patient was recently discharged from the hospital. She was hospitalized with acute bronchitis and was sent home on oral antibiotics. She has about 2 days left of her amoxicillin. Also during her hospitalization she was diagnosed with an acute right DVT of the soleal vein and was started on Xarelto. The patient remains fatigued. She has mild shortness of breath with exertion, she has an intermittent cough, but denies chest pain or hemoptysis. She reports a decreased appetite and is not eating very well. She has lost 9 pounds according to our scale since mid-September. She does not tolerate oral supplements such as boost or ensure. She denies fevers, chills, nausea, or vomiting.    MEDICAL HISTORY: Past Medical History  Diagnosis Date  . Hypertension   . External carotid artery stenosis     L side dx'd with doppler 2012  . UTI (lower urinary tract infection)   . Pneumonia   . Thyroid disease   . Cancer (HCC)     ALLERGIES:  is allergic to corticosteroids and minocycline.  MEDICATIONS:  Current Outpatient Prescriptions  Medication Sig Dispense Refill  . amoxicillin (AMOXIL)  500 MG capsule Take 2 capsules (1,000 mg total) by mouth 3 (three) times daily. 54 capsule 0  . dexamethasone (DECADRON) 4 MG tablet 4 mg po bid the day before, day of and day after chemotherapy 40 tablet 1  . folic acid (FOLVITE) 1 MG tablet Take 1 tablet (1 mg total) by mouth daily. 30 tablet 4  . HYDROcodone-acetaminophen (NORCO/VICODIN) 5-325 MG per tablet Take 1 tablet by mouth every 6 (six) hours as needed for moderate pain. (Patient not taking: Reported on 03/04/2015) 20 tablet 0  . levothyroxine (SYNTHROID, LEVOTHROID) 50 MCG tablet Take 50 mcg by mouth daily before breakfast.    . potassium chloride SA (K-DUR,KLOR-CON) 20 MEQ tablet Take 1 tablet (20 mEq total) by mouth daily. 10 tablet 0  . prochlorperazine (COMPAZINE) 10 MG tablet Take 1 tablet (10 mg total) by mouth every 6 (six) hours as needed for nausea or vomiting. (Patient not taking: Reported on 03/04/2015) 30 tablet 0  . Rivaroxaban (XARELTO STARTER PACK) 15 & 20 MG TBPK Take as directed on package: Start with one 36m tablet by mouth twice a day with food. On Day 22, switch to one 278mtablet once a day with food. 51 each 0  . sulfamethoxazole-trimethoprim (BACTRIM DS,SEPTRA DS) 800-160 MG tablet Take 1 tablet by mouth 2 (two) times daily. 14 tablet 0   No current facility-administered medications for this visit.    SURGICAL HISTORY:  Past Surgical History  Procedure Laterality Date  . Video bronchoscopy Bilateral 04/06/2014    Procedure: VIDEO BRONCHOSCOPY WITH FLUORO;  Surgeon: Juanito Doom, MD;  Location: [facility];  Service: Cardiopulmonary;  Laterality: Bilateral;  . Eye surgery      REVIEW OF SYSTEMS:  Constitutional: positive for fatigue Eyes: negative Ears, nose, mouth, throat, and face: negative Respiratory: positive for dyspnea on exertion Cardiovascular: negative Gastrointestinal: negative Genitourinary:negative Integument/breast: negative Hematologic/lymphatic:  negative Musculoskeletal:negative Neurological: negative Behavioral/Psych: negative Endocrine: negative Allergic/Immunologic: negative   PHYSICAL EXAMINATION: General appearance: alert, cooperative, fatigued and no distress Head: Normocephalic, without obvious abnormality, atraumatic Neck: no adenopathy, no JVD, supple, symmetrical, trachea midline and thyroid not enlarged, symmetric, no tenderness/mass/nodules Lymph nodes: Cervical, supraclavicular, and axillary nodes normal. Resp: clear to auscultation bilaterally Back: symmetric, no curvature. ROM normal. No CVA tenderness. Cardio: regular rate and rhythm, S1, S2 normal, no murmur, click, rub or gallop GI: soft, non-tender; bowel sounds normal; no masses,  no organomegaly Extremities: extremities normal, atraumatic, no cyanosis or edema Neurologic: Alert and oriented X 3, normal strength and tone. Normal symmetric reflexes. Normal coordination and gait  ECOG PERFORMANCE STATUS: 1 - Symptomatic but completely ambulatory  Blood pressure 132/67, pulse 109, temperature 97.8 F (36.6 C), temperature source Oral, resp. rate 18, height _0  (1.6 m), weight 133 lb 8 oz (60.555 kg), SpO2 95 %.  LABORATORY DATA: Lab Results  Component Value Date   WBC 5.7 04/01/2015   HGB 10.5* 04/01/2015   HCT 31.5* 04/01/2015   MCV 105.2* 04/01/2015   PLT 384 04/01/2015      Chemistry      Component Value Date/Time   NA 133* 04/01/2015 1103   NA 137 03/25/2015 0555   K 5.0 04/01/2015 1103   K 3.7 03/25/2015 0555   CL 108 03/25/2015 0555   CO2 17* 04/01/2015 1103   CO2 20* 03/25/2015 0555   BUN 15.4 04/01/2015 1103   BUN 7 03/25/2015 0555   CREATININE 1.3* 04/01/2015 1103   CREATININE 0.86 03/25/2015 0555   CREATININE 0.79 09/10/2014 1439      Component Value Date/Time   CALCIUM 9.7 04/01/2015 1103   CALCIUM 8.3* 03/25/2015 0555   ALKPHOS 112 04/01/2015 1103   ALKPHOS 89 03/24/2015 0426   AST 36* 04/01/2015 1103   AST 27 03/24/2015  0426   ALT 27 04/01/2015 1103   ALT 16 03/24/2015 0426   BILITOT 0.55 04/01/2015 1103   BILITOT 0.9 03/24/2015 0426       RADIOGRAPHIC STUDIES: Dg Chest 2 View  03/23/2015  CLINICAL DATA:  81 year old with current history of adenocarcinoma of the right lower lobe, presenting with acute worsening of a chronic cough. EXAM: CHEST  2 VIEW COMPARISON:  CT chest 02/11/2015 and earlier. PET-CT 11/13/2014. Chest x-rays 10/23/2014 and earlier. FINDINGS: Cardiac silhouette moderately enlarged, unchanged. Thoracic aorta mildly tortuous and atherosclerotic. Hilar and mediastinal contours otherwise unremarkable. Airspace opacities in the right lower lobe consistent with the adenocarcinoma are improved since the most recent prior CT 02/11/2015. Emphysematous changes throughout both lungs as noted previously. Prominent bronchovascular markings diffusely, more so than on prior examinations. No new pulmonary parenchymal abnormalities otherwise. No pleural effusions. Pulmonary vascularity normal. Degenerative changes involving the thoracic spine. IMPRESSION: 1. Mild to moderate changes of acute bronchitis and/or asthma superimposed upon COPD/emphysema. No acute cardiopulmonary disease otherwise. 2. Improvement in the airspace opacities in the right lower lobe shown on prior examinations to represent adenocarcinoma. 3. Stable cardiomegaly without evidence of pulmonary edema. Electronically Signed   By: Evangeline Dakin M.D.   On: 03/23/2015 22:22   Dg Chest [facility] 1 [address]  03/24/2015  CLINICAL DATA:  Abnormal lung sounds at the bases. Cough. Follow-up. EXAM: PORTABLE CHEST 1 VIEW COMPARISON:  03/23/2015 FINDINGS: The heart is mildly enlarged. There is atherosclerosis of the aorta. There is persistent patchy density at the right base consistent with resolving pneumonia. There is new mild patchy density at the left base consistent with mild left base pneumonia/ atelectasis. No apparent effusions. Upper lungs are clear.  IMPRESSION: Persistent patchy density at the right base consistent with resolving pneumonia in that region. Slightly worsened patchy density at the left base which could be atelectasis or new infiltrate in that region. Electronically Signed   By: Nelson Chimes M.D.   On: 03/24/2015 09:45    ASSESSMENT AND PLAN: This is a very pleasant 81 year old white female with stage IIIA/B non-small cell lung cancer, adenocarcinoma involving the right lower lobe with hilar lymphadenopathy as well as several satellite lesions. The molecular study showed no evidence for target mutations. She most recently received carboplatin and Alimta status post 5 cycles. Her Alimta was reduced to 40 mg region squared due to her history of renal insufficiency with elevated serum creatinine. Her current serum creatinine is 1.3. Additional chemotherapy was placed on hold due to poor tolerance.  The patient is slowly recovering from her recent hospitalization. Recommend that she continue her course of oral antibiotics. Her counts have improved today. Discussed diet and supplements with the patient and her daughter. Recommended that she try [employer] and I will go ahead and refer her to our dietitian for further recommendations.  For her DVT she will continue to take Xarelto. She will let us know she has any evidence of bleeding.  The patient is scheduled to have a CT scan for restaging on November 1 and she will keep this appointment. I have added a lab appointment to November 1 for her and she will follow with Dr. Julien Nordmann 1-2 days after the CT scan to discuss the results.  She was advised to call immediately if she has any concerning symptoms in the interval. The patient voices understanding of current disease status and treatment options and is in agreement with the current care plan.  All questions were answered. The patient knows to call the clinic with any problems, questions or concerns. We can certainly see  the patient much sooner if necessary.  Plan discussed with Dr. Julien Nordmann.  Mikey Bussing, DNP, AGPCNP-BC, AOCNP 04/01/2015

## 2015-04-01 NOTE — Telephone Encounter (Signed)
per pot to sch pt appt-gave pt copy of avs and contrast fot CTscan

## 2015-04-16 ENCOUNTER — Encounter (HOSPITAL_COMMUNITY): Payer: Self-pay

## 2015-04-16 ENCOUNTER — Ambulatory Visit (HOSPITAL_COMMUNITY)
Admission: RE | Admit: 2015-04-16 | Discharge: 2015-04-16 | Disposition: A | Discharge status: Home / Self Care | Payer: Medicare Other | Source: Ambulatory Visit | Attending: Nurse Practitioner | Admitting: Nurse Practitioner

## 2015-04-16 ENCOUNTER — Other Ambulatory Visit: Payer: Self-pay | Admitting: Nurse Practitioner

## 2015-04-16 DIAGNOSIS — I2510 Atherosclerotic heart disease of native coronary artery without angina pectoris: Secondary | ICD-10-CM | POA: Diagnosis not present

## 2015-04-16 DIAGNOSIS — K5730 Diverticulosis of large intestine without perforation or abscess without bleeding: Secondary | ICD-10-CM | POA: Diagnosis not present

## 2015-04-16 DIAGNOSIS — Z08 Encounter for follow-up examination after completed treatment for malignant neoplasm: Secondary | ICD-10-CM | POA: Insufficient documentation

## 2015-04-16 DIAGNOSIS — C3491 Malignant neoplasm of unspecified part of right bronchus or lung: Secondary | ICD-10-CM

## 2015-04-16 DIAGNOSIS — I7090 Unspecified atherosclerosis: Secondary | ICD-10-CM | POA: Insufficient documentation

## 2015-04-16 DIAGNOSIS — C3431 Malignant neoplasm of lower lobe, right bronchus or lung: Secondary | ICD-10-CM | POA: Diagnosis not present

## 2015-04-16 MED ORDER — IOHEXOL 300 MG/ML  SOLN
100.0000 mL | Freq: Once | INTRAMUSCULAR | Status: AC | PRN
  Administered 2015-04-16: 100 mL via INTRAVENOUS

## 2015-04-18 ENCOUNTER — Ambulatory Visit (HOSPITAL_BASED_OUTPATIENT_CLINIC_OR_DEPARTMENT_OTHER): Payer: Medicare Other | Admitting: Internal Medicine

## 2015-04-18 ENCOUNTER — Encounter: Payer: Self-pay | Admitting: Internal Medicine

## 2015-04-18 VITALS — BP 145/63 | HR 89 | Temp 97.6°F | Resp 17 | Ht 63.0 in | Wt 136.0 lb

## 2015-04-18 DIAGNOSIS — C3431 Malignant neoplasm of lower lobe, right bronchus or lung: Secondary | ICD-10-CM | POA: Diagnosis not present

## 2015-04-18 DIAGNOSIS — C3491 Malignant neoplasm of unspecified part of right bronchus or lung: Secondary | ICD-10-CM

## 2015-04-18 NOTE — Progress Notes (Signed)
[place] Telephone:([phone]) [phone]   Fax:(336) [phone]  OFFICE PROGRESS NOTE  GREEN, [name] Bachelor, MD [address] [place] [address]  DIAGNOSIS: Stage IIIA (T3, N1, M0) non-small cell lung cancer, adenocarcinoma with negative EGFR mutation and negative for gene translocation diagnosed in May 2016 in a patient with remote history of smoking. She has multiple satellite nodules that would place her at a higher stage.  GU[facility] 360 Molecular study: positive for BRCA2, NOTCH1, RAF1 and NF![name]  PRIOR THERAPY: Systemic chemotherapy with carboplatin for an AUC of 5 and Alimta at 500 mg/m given every 3 weeks. Status post 5 cycles. Last dose was given 03/11/2015 with stable disease.   CURRENT THERAPY: None.  INTERVAL HISTORY: [name] Carlson 81 y.o. female returns to the clinic today for follow-up visit accompanied by her son and daughter. The patient is feeling fine today with no specific complaints except for mild fatigue. She completed 5 cycles of systemic chemotherapy was carboplatin and Alimta. She had increasing fatigue and weakness with the last cycle of her treatment in addition to hypotension. Her treatment was discontinued after cycle #5. She feels much better after discontinuing her chemotherapy as well as some of her antihypertensive medications. She also has mild shortness breath with exertion but no significant chest pain, cough or hemoptysis. The patient denied having any significant weight loss or night sweats. She has no fever or chills. She has no significant nausea or vomiting. She had repeat CT scan of the chest, abdomen and pelvis performed recently and she is here for evaluation and discussion of her scan results.  MEDICAL HISTORY: Past Medical History  Diagnosis Date  . Hypertension   . External carotid artery stenosis     L side dx'd with doppler 2012  . UTI (lower urinary tract infection)   . Pneumonia   . Thyroid disease   . Cancer  (HCC)     ALLERGIES:  is allergic to corticosteroids and minocycline.  MEDICATIONS:  Current Outpatient Prescriptions  Medication Sig Dispense Refill  . amoxicillin (AMOXIL) 500 MG capsule Take 2 capsules (1,000 mg total) by mouth 3 (three) times daily. 54 capsule 0  . dexamethasone (DECADRON) 4 MG tablet 4 mg po bid the day before, day of and day after chemotherapy 40 tablet 1  . folic acid (FOLVITE) 1 MG tablet Take 1 tablet (1 mg total) by mouth daily. 30 tablet 4  . HYDROcodone-acetaminophen (NORCO/VICODIN) 5-325 MG per tablet Take 1 tablet by mouth every 6 (six) hours as needed for moderate pain. (Patient not taking: Reported on 03/04/2015) 20 tablet 0  . levothyroxine (SYNTHROID, LEVOTHROID) 50 MCG tablet Take 50 mcg by mouth daily before breakfast.    . potassium chloride SA (K-DUR,KLOR-CON) 20 MEQ tablet Take 1 tablet (20 mEq total) by mouth daily. 10 tablet 0  . prochlorperazine (COMPAZINE) 10 MG tablet Take 1 tablet (10 mg total) by mouth every 6 (six) hours as needed for nausea or vomiting. (Patient not taking: Reported on 03/04/2015) 30 tablet 0  . Rivaroxaban (XARELTO STARTER PACK) 15 & 20 MG TBPK Take as directed on package: Start with one 64m tablet by mouth twice a day with food. On Day 22, switch to one 240mtablet once a day with food. 51 each 0  . sulfamethoxazole-trimethoprim (BACTRIM DS,SEPTRA DS) 800-160 MG tablet Take 1 tablet by mouth 2 (two) times daily. 14 tablet 0   No current facility-administered medications for this visit.  SURGICAL HISTORY:  Past Surgical History  Procedure Laterality Date  . Video bronchoscopy Bilateral 04/06/2014    Procedure: VIDEO BRONCHOSCOPY WITH FLUORO;  Surgeon: Juanito Doom, MD;  Location: [facility];  Service: Cardiopulmonary;  Laterality: Bilateral;  . Eye surgery      REVIEW OF SYSTEMS:  Constitutional: positive for fatigue Eyes: negative Ears, nose, mouth, throat, and face: negative Respiratory: positive for dyspnea  on exertion Cardiovascular: negative Gastrointestinal: negative Genitourinary:negative Integument/breast: negative Hematologic/lymphatic: negative Musculoskeletal:negative Neurological: negative Behavioral/Psych: negative Endocrine: negative Allergic/Immunologic: negative   PHYSICAL EXAMINATION: General appearance: alert, cooperative, fatigued and no distress Head: Normocephalic, without obvious abnormality, atraumatic Neck: no adenopathy, no JVD, supple, symmetrical, trachea midline and thyroid not enlarged, symmetric, no tenderness/mass/nodules Lymph nodes: Cervical, supraclavicular, and axillary nodes normal. Resp: rales RLL Back: symmetric, no curvature. ROM normal. No CVA tenderness. Cardio: regular rate and rhythm, S1, S2 normal, no murmur, click, rub or gallop GI: soft, non-tender; bowel sounds normal; no masses,  no organomegaly Extremities: extremities normal, atraumatic, no cyanosis or edema Neurologic: Alert and oriented X 3, normal strength and tone. Normal symmetric reflexes. Normal coordination and gait  ECOG PERFORMANCE STATUS: 1 - Symptomatic but completely ambulatory  Blood pressure 145/63, pulse 89, temperature 97.6 F (36.4 C), temperature source Oral, resp. rate 17, height _0  (1.6 m), weight 136 lb (61.689 kg), SpO2 98 %.  LABORATORY DATA: Lab Results  Component Value Date   WBC 5.7 04/01/2015   HGB 10.5* 04/01/2015   HCT 31.5* 04/01/2015   MCV 105.2* 04/01/2015   PLT 384 04/01/2015      Chemistry      Component Value Date/Time   NA 133* 04/01/2015 1103   NA 137 03/25/2015 0555   K 5.0 04/01/2015 1103   K 3.7 03/25/2015 0555   CL 108 03/25/2015 0555   CO2 17* 04/01/2015 1103   CO2 20* 03/25/2015 0555   BUN 15.4 04/01/2015 1103   BUN 7 03/25/2015 0555   CREATININE 1.3* 04/01/2015 1103   CREATININE 0.86 03/25/2015 0555   CREATININE 0.79 09/10/2014 1439      Component Value Date/Time   CALCIUM 9.7 04/01/2015 1103   CALCIUM 8.3* 03/25/2015  0555   ALKPHOS 112 04/01/2015 1103   ALKPHOS 89 03/24/2015 0426   AST 36* 04/01/2015 1103   AST 27 03/24/2015 0426   ALT 27 04/01/2015 1103   ALT 16 03/24/2015 0426   BILITOT 0.55 04/01/2015 1103   BILITOT 0.9 03/24/2015 0426       RADIOGRAPHIC STUDIES: Dg Chest 2 View  03/23/2015  CLINICAL DATA:  81 year old with current history of adenocarcinoma of the right lower lobe, presenting with acute worsening of a chronic cough. EXAM: CHEST  2 VIEW COMPARISON:  CT chest 02/11/2015 and earlier. PET-CT 11/13/2014. Chest x-rays 10/23/2014 and earlier. FINDINGS: Cardiac silhouette moderately enlarged, unchanged. Thoracic aorta mildly tortuous and atherosclerotic. Hilar and mediastinal contours otherwise unremarkable. Airspace opacities in the right lower lobe consistent with the adenocarcinoma are improved since the most recent prior CT 02/11/2015. Emphysematous changes throughout both lungs as noted previously. Prominent bronchovascular markings diffusely, more so than on prior examinations. No new pulmonary parenchymal abnormalities otherwise. No pleural effusions. Pulmonary vascularity normal. Degenerative changes involving the thoracic spine. IMPRESSION: 1. Mild to moderate changes of acute bronchitis and/or asthma superimposed upon COPD/emphysema. No acute cardiopulmonary disease otherwise. 2. Improvement in the airspace opacities in the right lower lobe shown on prior examinations to represent adenocarcinoma. 3. Stable cardiomegaly without evidence of pulmonary edema. Electronically  Signed   By: Evangeline Dakin M.D.   On: 03/23/2015 22:22   Ct Chest W Contrast  04/16/2015  CLINICAL DATA:  Restaging right lower lobe lung adenocarcinoma status post chemotherapy. EXAM: CT CHEST, ABDOMEN, AND PELVIS WITH CONTRAST TECHNIQUE: Multidetector CT imaging of the chest, abdomen and pelvis was performed following the standard protocol during bolus administration of intravenous contrast. CONTRAST:  192m OMNIPAQUE  IOHEXOL 300 MG/ML  SOLN COMPARISON:  02/11/2015 CT of the chest, abdomen and pelvis. 11/13/2014 PET-CT. FINDINGS: CT CHEST FINDINGS Mediastinum/Nodes: Stable mild cardiomegaly. No pericardial fluid/thickening. There is atherosclerosis of the thoracic aorta, the great vessels of the mediastinum and the coronary arteries, including calcified atherosclerotic plaque in the left main, left anterior descending and left circumflex coronary arteries. Great vessels are normal in course and caliber. No central pulmonary emboli. Normal visualized thyroid. Normal esophagus. No pathologically enlarged axillary, mediastinal or hilar lymph nodes. Lungs/Pleura: No pneumothorax. No pleural effusion. There is a posterior right lower lobe 6.8 x 5.0 cm lung mass (series 4/ image 36), previously 6.8 x 5.2 cm, not appreciably changed. There are numerous (greater than 10) small satellite nodules scattered throughout the right lower lobe, largest 0.8 cm, unchanged. There are 3 scattered left lower lobe pulmonary nodules, largest 4 mm (series 4/ image 27), not appreciably changed. No new significant pulmonary nodules. No acute consolidative airspace disease. Re- demonstrated is underlying interstitial lung disease characterized by diffuse subpleural reticulation throughout both lungs without significant regions of traction bronchiectasis or frank honeycombing, which appears to be slowly worsening since 06/23/2013 chest CT. Musculoskeletal: No aggressive appearing focal osseous lesions. Mild degenerative changes in the thoracic spine. CT ABDOMEN PELVIS FINDINGS Hepatobiliary: Normal liver with no liver mass. Normal gallbladder with no radiopaque cholelithiasis. No biliary ductal dilatation. Pancreas: Normal, with no mass or duct dilation. Spleen: Normal size. No mass. Adrenals/Urinary Tract: Normal adrenals. Subcentimeter hypodense renal lesion in the posterior lower right kidney, too small to characterize. No hydronephrosis. Duplicated left  renal collecting system. Normal bladder. Stomach/Bowel: Grossly normal stomach. Normal caliber small bowel with no small bowel wall thickening. Normal appendix. Marked diffuse colonic diverticulosis, with no acute colonic wall thickening or pericolonic fat stranding. Stable mild wall thickening throughout the sigmoid colon is nonspecific and likely reflects chronic diverticulosis. Vascular/Lymphatic: Atherosclerotic nonaneurysmal abdominal aorta. Patent portal, splenic and renal veins. No pathologically enlarged lymph nodes in the abdomen or pelvis. Reproductive: Grossly normal uterus.  No adnexal mass. Other: No pneumoperitoneum, ascites or focal fluid collection. Musculoskeletal: No aggressive appearing focal osseous lesions. Mild-to-moderate degenerative changes in the lumbar spine. IMPRESSION: 1. Stable primary right lower lobe lung carcinoma and right lower lobe satellite pulmonary nodules. 2. Stable indeterminate tiny left lower lobe pulmonary nodules. No new pulmonary nodules. 3. Otherwise no potential sites of metastatic disease in the chest, abdomen or pelvis. 4. Continued slow progression of underlying interstitial lung disease characterized by diffuse subpleural reticulation without significant traction bronchiectasis or frank honeycombing. Differential includes nonspecific interstitial pneumonia (NSIP) or usual interstitial pneumonia (UIP). 5. Atherosclerosis, including left main and 2 vessel coronary artery disease. 6. Stable severe colonic diverticulosis with stable mild nonspecific sigmoid colon wall thickening likely reflecting muscular hypertrophy of chronic diverticulosis. Electronically Signed   By: JIlona SorrelM.D.   On: 04/16/2015 15:54   Ct Abdomen Pelvis W Contrast  04/16/2015  CLINICAL DATA:  Restaging right lower lobe lung adenocarcinoma status post chemotherapy. EXAM: CT CHEST, ABDOMEN, AND PELVIS WITH CONTRAST TECHNIQUE: Multidetector CT imaging of the chest, abdomen and  pelvis was  performed following the standard protocol during bolus administration of intravenous contrast. CONTRAST:  157m OMNIPAQUE IOHEXOL 300 MG/ML  SOLN COMPARISON:  02/11/2015 CT of the chest, abdomen and pelvis. 11/13/2014 PET-CT. FINDINGS: CT CHEST FINDINGS Mediastinum/Nodes: Stable mild cardiomegaly. No pericardial fluid/thickening. There is atherosclerosis of the thoracic aorta, the great vessels of the mediastinum and the coronary arteries, including calcified atherosclerotic plaque in the left main, left anterior descending and left circumflex coronary arteries. Great vessels are normal in course and caliber. No central pulmonary emboli. Normal visualized thyroid. Normal esophagus. No pathologically enlarged axillary, mediastinal or hilar lymph nodes. Lungs/Pleura: No pneumothorax. No pleural effusion. There is a posterior right lower lobe 6.8 x 5.0 cm lung mass (series 4/ image 36), previously 6.8 x 5.2 cm, not appreciably changed. There are numerous (greater than 10) small satellite nodules scattered throughout the right lower lobe, largest 0.8 cm, unchanged. There are 3 scattered left lower lobe pulmonary nodules, largest 4 mm (series 4/ image 27), not appreciably changed. No new significant pulmonary nodules. No acute consolidative airspace disease. Re- demonstrated is underlying interstitial lung disease characterized by diffuse subpleural reticulation throughout both lungs without significant regions of traction bronchiectasis or frank honeycombing, which appears to be slowly worsening since 06/23/2013 chest CT. Musculoskeletal: No aggressive appearing focal osseous lesions. Mild degenerative changes in the thoracic spine. CT ABDOMEN PELVIS FINDINGS Hepatobiliary: Normal liver with no liver mass. Normal gallbladder with no radiopaque cholelithiasis. No biliary ductal dilatation. Pancreas: Normal, with no mass or duct dilation. Spleen: Normal size. No mass. Adrenals/Urinary Tract: Normal adrenals. Subcentimeter  hypodense renal lesion in the posterior lower right kidney, too small to characterize. No hydronephrosis. Duplicated left renal collecting system. Normal bladder. Stomach/Bowel: Grossly normal stomach. Normal caliber small bowel with no small bowel wall thickening. Normal appendix. Marked diffuse colonic diverticulosis, with no acute colonic wall thickening or pericolonic fat stranding. Stable mild wall thickening throughout the sigmoid colon is nonspecific and likely reflects chronic diverticulosis. Vascular/Lymphatic: Atherosclerotic nonaneurysmal abdominal aorta. Patent portal, splenic and renal veins. No pathologically enlarged lymph nodes in the abdomen or pelvis. Reproductive: Grossly normal uterus.  No adnexal mass. Other: No pneumoperitoneum, ascites or focal fluid collection. Musculoskeletal: No aggressive appearing focal osseous lesions. Mild-to-moderate degenerative changes in the lumbar spine. IMPRESSION: 1. Stable primary right lower lobe lung carcinoma and right lower lobe satellite pulmonary nodules. 2. Stable indeterminate tiny left lower lobe pulmonary nodules. No new pulmonary nodules. 3. Otherwise no potential sites of metastatic disease in the chest, abdomen or pelvis. 4. Continued slow progression of underlying interstitial lung disease characterized by diffuse subpleural reticulation without significant traction bronchiectasis or frank honeycombing. Differential includes nonspecific interstitial pneumonia (NSIP) or usual interstitial pneumonia (UIP). 5. Atherosclerosis, including left main and 2 vessel coronary artery disease. 6. Stable severe colonic diverticulosis with stable mild nonspecific sigmoid colon wall thickening likely reflecting muscular hypertrophy of chronic diverticulosis. Electronically Signed   By: JIlona SorrelM.D.   On: 04/16/2015 15:54   Dg Chest Port 1 View  03/24/2015  CLINICAL DATA:  Abnormal lung sounds at the bases. Cough. Follow-up. EXAM: PORTABLE CHEST 1 VIEW  COMPARISON:  03/23/2015 FINDINGS: The heart is mildly enlarged. There is atherosclerosis of the aorta. There is persistent patchy density at the right base consistent with resolving pneumonia. There is new mild patchy density at the left base consistent with mild left base pneumonia/ atelectasis. No apparent effusions. Upper lungs are clear. IMPRESSION: Persistent patchy density at the right base consistent with resolving  pneumonia in that region. Slightly worsened patchy density at the left base which could be atelectasis or new infiltrate in that region. Electronically Signed   By: Nelson Chimes M.D.   On: 03/24/2015 09:45    ASSESSMENT AND PLAN: This is a very pleasant 81 years old white female with stage IIIA/B non-small cell lung cancer, adenocarcinoma involving the right lower lobe with hilar lymphadenopathy as well as several satellite lesions. The molecular study showed no evidence for target mutations. The patient completed treatment with systemic chemotherapy with carboplatin and Alimta status post 5 cycles. Her treatment was discontinued secondary to increasing fatigue and weakness as well as hypotension. The recent CT scan of the chest, abdomen and pelvis showed no evidence for disease progression. I discussed the scan results and showed the images to the patient and her family. I gave her the option of continuous observation and close monitoring versus consideration of maintenance systemic chemotherapy with single agent Alimta. After a lengthy discussion the patient and her family would like to continue on observation for now. I will see her back for follow-up visit in 3 months with repeat CT scan of the chest, abdomen and pelvis for restaging of her disease. She was advised to call immediately if she has any concerning symptoms in the interval. The patient voices understanding of current disease status and treatment options and is in agreement with the current care plan.  All questions were  answered. The patient knows to call the clinic with any problems, questions or concerns. We can certainly see the patient much sooner if necessary.  Disclaimer: This note was dictated with voice recognition software. Similar sounding words can inadvertently be transcribed and may not be corrected upon review.

## 2015-04-22 ENCOUNTER — Other Ambulatory Visit: Payer: Self-pay | Admitting: Internal Medicine

## 2015-04-22 DIAGNOSIS — I82409 Acute embolism and thrombosis of unspecified deep veins of unspecified lower extremity: Secondary | ICD-10-CM

## 2015-04-22 MED ORDER — RIVAROXABAN 20 MG PO TABS: 20.0000 mg | ORAL_TABLET | Freq: Every day | ORAL | Status: DC

## 2015-04-23 ENCOUNTER — Encounter: Payer: Self-pay | Admitting: Internal Medicine

## 2015-04-23 NOTE — Progress Notes (Signed)
Per [facility], xarelto approved thru 04/22/16 under medicare part d  [id number]. I sent to medical records.

## 2015-04-23 NOTE — Progress Notes (Signed)
I faxed [employer] req for xerelto

## 2015-05-30 ENCOUNTER — Telehealth: Payer: Self-pay | Admitting: *Deleted

## 2015-05-30 ENCOUNTER — Other Ambulatory Visit: Payer: Self-pay | Admitting: Internal Medicine

## 2015-05-30 NOTE — Telephone Encounter (Signed)
No need to refill Folic Acid. Pt is not currently on Alimta. Pt notified.

## 2015-06-20 ENCOUNTER — Other Ambulatory Visit: Payer: Self-pay | Admitting: Medical Oncology

## 2015-06-21 ENCOUNTER — Other Ambulatory Visit: Payer: Self-pay | Admitting: Medical Oncology

## 2015-06-21 ENCOUNTER — Telehealth: Payer: Self-pay | Admitting: Internal Medicine

## 2015-06-21 NOTE — Telephone Encounter (Signed)
Per 1/5 pof move 2/3 lab to 1/9 prior to ct. Left message for patient and also spoke with son Delfino Lovett re changes and new date/time for lab 1/9 @ 1 pm followed by ct @ [facility] at 2 pm - MM 2/9 and 2/3 lab cxd.

## 2015-06-24 ENCOUNTER — Other Ambulatory Visit: Payer: Medicare Other

## 2015-06-24 ENCOUNTER — Ambulatory Visit (HOSPITAL_COMMUNITY): Payer: Medicare Other

## 2015-06-27 ENCOUNTER — Telehealth: Payer: Self-pay | Admitting: Internal Medicine

## 2015-06-27 NOTE — Telephone Encounter (Signed)
PATIENT CALLED TO R/S LAB/CT FROM 1/9. GAVE PATIENT NEW APPOINTMENT FOR LAB/CT 1/18 @ 1:30 PM FOR LAB AND 2:30 PM FOR CT - SPOKE WITH KENISHA IN [facility].

## 2015-07-03 ENCOUNTER — Other Ambulatory Visit (HOSPITAL_BASED_OUTPATIENT_CLINIC_OR_DEPARTMENT_OTHER): Payer: Medicare Other

## 2015-07-03 ENCOUNTER — Encounter (HOSPITAL_COMMUNITY): Payer: Self-pay

## 2015-07-03 ENCOUNTER — Ambulatory Visit (HOSPITAL_COMMUNITY)
Admission: RE | Admit: 2015-07-03 | Discharge: 2015-07-03 | Disposition: A | Discharge status: Home / Self Care | Payer: Medicare Other | Source: Ambulatory Visit | Attending: Internal Medicine | Admitting: Internal Medicine

## 2015-07-03 DIAGNOSIS — C3431 Malignant neoplasm of lower lobe, right bronchus or lung: Secondary | ICD-10-CM

## 2015-07-03 DIAGNOSIS — C3491 Malignant neoplasm of unspecified part of right bronchus or lung: Secondary | ICD-10-CM | POA: Diagnosis not present

## 2015-07-03 DIAGNOSIS — I700 Atherosclerosis of aorta: Secondary | ICD-10-CM | POA: Insufficient documentation

## 2015-07-03 DIAGNOSIS — K5730 Diverticulosis of large intestine without perforation or abscess without bleeding: Secondary | ICD-10-CM | POA: Diagnosis not present

## 2015-07-03 LAB — COMPREHENSIVE METABOLIC PANEL
ALK PHOS: 122 U/L (ref 40–150)
ALT: 14 U/L (ref 0–55)
AST: 23 U/L (ref 5–34)
Albumin: 3.9 g/dL (ref 3.5–5.0)
Anion Gap: 10 mEq/L (ref 3–11)
BILIRUBIN TOTAL: 0.51 mg/dL (ref 0.20–1.20)
BUN: 23.3 mg/dL (ref 7.0–26.0)
CHLORIDE: 101 meq/L (ref 98–109)
CO2: 25 meq/L (ref 22–29)
CREATININE: 1.2 mg/dL — AB (ref 0.6–1.1)
Calcium: 10.1 mg/dL (ref 8.4–10.4)
EGFR: 41 mL/min/{1.73_m2} — ABNORMAL LOW (ref >90)
Glucose: 84 mg/dl (ref 70–140)
Potassium: 4.7 mEq/L (ref 3.5–5.1)
SODIUM: 136 meq/L (ref 136–145)
TOTAL PROTEIN: 7.9 g/dL (ref 6.4–8.3)

## 2015-07-03 LAB — CBC WITH DIFFERENTIAL/PLATELET
BASO%: 0.2 % (ref 0.0–2.0)
Basophils Absolute: 0 10*3/uL (ref 0.0–0.1)
EOS%: 3 % (ref 0.0–7.0)
Eosinophils Absolute: 0.1 10*3/uL (ref 0.0–0.5)
HCT: 33.7 % — ABNORMAL LOW (ref 34.8–46.6)
HGB: 11.3 g/dL — ABNORMAL LOW (ref 11.6–15.9)
LYMPH%: 26.8 % (ref 14.0–49.7)
MCH: 33.3 pg (ref 25.1–34.0)
MCHC: 33.5 g/dL (ref 31.5–36.0)
MCV: 99.4 fL (ref 79.5–101.0)
MONO#: 0.7 10*3/uL (ref 0.1–0.9)
MONO%: 15.3 % — ABNORMAL HIGH (ref 0.0–14.0)
NEUT%: 54.7 % (ref 38.4–76.8)
NEUTROS ABS: 2.6 10*3/uL (ref 1.5–6.5)
Platelets: 171 10*3/uL (ref 145–400)
RBC: 3.39 10*6/uL — AB (ref 3.70–5.45)
RDW: 13.2 % (ref 11.2–14.5)
WBC: 4.7 10*3/uL (ref 3.9–10.3)
lymph#: 1.3 10*3/uL (ref 0.9–3.3)

## 2015-07-03 MED ORDER — IOHEXOL 300 MG/ML  SOLN
75.0000 mL | Freq: Once | INTRAMUSCULAR | Status: AC | PRN
  Administered 2015-07-03: 75 mL via INTRAVENOUS

## 2015-07-17 ENCOUNTER — Ambulatory Visit (INDEPENDENT_AMBULATORY_CARE_PROVIDER_SITE_OTHER): Payer: Medicare Other | Admitting: Family Medicine

## 2015-07-17 VITALS — BP 142/78 | HR 60 | Temp 97.4°F | Resp 20 | Ht 63.0 in | Wt 136.6 lb

## 2015-07-17 DIAGNOSIS — R319 Hematuria, unspecified: Secondary | ICD-10-CM | POA: Diagnosis not present

## 2015-07-17 LAB — POCT URINALYSIS DIP (MANUAL ENTRY)
Bilirubin, UA: NEGATIVE
Glucose, UA: NEGATIVE
Ketones, POC UA: NEGATIVE
Nitrite, UA: POSITIVE — AB
Protein Ur, POC: NEGATIVE
Spec Grav, UA: 1.01
Urobilinogen, UA: 0.2
pH, UA: 6.5

## 2015-07-17 LAB — POC MICROSCOPIC URINALYSIS (UMFC): Mucus: ABSENT

## 2015-07-17 MED ORDER — NITROFURANTOIN MONOHYD MACRO 100 MG PO CAPS: 100.0000 mg | ORAL_CAPSULE | Freq: Two times a day (BID) | ORAL | Status: DC

## 2015-07-17 NOTE — Progress Notes (Signed)
   Subjective:    Patient ID: [name] Carlson, female    DOB: 25-May-1923, 82 y.o.   MRN: [id number] By signing my name below, I, Zola Button, attest that this documentation has been prepared under the direction and in the presence of Robyn Haber, MD.  Electronically Signed: Zola Button, Medical Scribe. 07/17/2015. 1:02 PM.  HPI HPI Comments: [name] Carlson is a 82 y.o. female with a history of A Fib and non-small cell cancer of the right lung who presents to the [facility] complaining of a possible UTI. Patient notes she noticed blood when wiping last night. She also noticed a small amount of bright red blood on her pad this morning. Patient denies abdominal pain. She was recently started on Xarelto a few months ago, but she has not had any problems with bleeding so far. She was diagnosed with lung cancer in 2014 but is not currently on chemotherapy for her lung cancer. Patient is a former smoker, but quit 55 years ago.    Review of Systems  Gastrointestinal: Negative for abdominal pain, blood in stool and anal bleeding.       Objective:   Physical Exam CONSTITUTIONAL: Well developed/well nourished HEAD: Normocephalic/atraumatic EYES: EOM/PERRL ENMT: Mucous membranes moist NECK: supple no meningeal signs SPINE: entire spine nontender CV: S1/S2 noted, no murmurs/rubs/gallops noted LUNGS: Lungs are clear to auscultation bilaterally, no apparent distress ABDOMEN: soft, nontender, no rebound or guarding GU: no cva tenderness NEURO: Pt is awake/alert, moves all extremitiesx4 EXTREMITIES: pulses normal, full ROM SKIN: warm, color normal PSYCH: no abnormalities of mood noted  Results for orders placed or performed in visit on 07/17/15  POCT urinalysis dipstick  Result Value Ref Range   Color, UA yellow yellow   Clarity, UA cloudy (A) clear   Glucose, UA negative negative   Bilirubin, UA negative negative   Ketones, POC UA negative negative   Spec Grav, UA  1.010    Blood, UA small (A) negative   pH, UA 6.5    Protein Ur, POC negative negative   Urobilinogen, UA 0.2    Nitrite, UA Positive (A) Negative   Leukocytes, UA large (3+) (A) Negative  POCT Microscopic Urinalysis ([facility])  Result Value Ref Range   WBC,UR,HPF,POC Too numerous to count  (A) None WBC/hpf   RBC,UR,HPF,POC Few (A) None RBC/hpf   Bacteria Many (A) None, Too numerous to count   Mucus Absent Absent   Epithelial Cells, UR Per Microscopy Few (A) None, Too numerous to count cells/hpf         Assessment & Plan:    This chart was scribed in my presence and reviewed by me personally.    ICD-9-CM ICD-10-CM   1. Hematuria 599.70 R31.9 POCT urinalysis dipstick     POCT Microscopic Urinalysis ([facility])     Urine culture     nitrofurantoin, macrocrystal-monohydrate, (MACROBID) 100 MG capsule     Signed, Robyn Haber, MD

## 2015-07-17 NOTE — Patient Instructions (Signed)

## 2015-07-19 ENCOUNTER — Other Ambulatory Visit: Payer: Medicare Other

## 2015-07-25 ENCOUNTER — Encounter: Payer: Self-pay | Admitting: Internal Medicine

## 2015-07-25 ENCOUNTER — Ambulatory Visit (HOSPITAL_BASED_OUTPATIENT_CLINIC_OR_DEPARTMENT_OTHER): Payer: Medicare Other | Admitting: Internal Medicine

## 2015-07-25 VITALS — BP 147/64 | HR 93 | Temp 97.8°F | Resp 17 | Ht 63.0 in | Wt 135.5 lb

## 2015-07-25 DIAGNOSIS — C3431 Malignant neoplasm of lower lobe, right bronchus or lung: Secondary | ICD-10-CM | POA: Diagnosis not present

## 2015-07-25 DIAGNOSIS — R059 Cough, unspecified: Secondary | ICD-10-CM

## 2015-07-25 DIAGNOSIS — C3491 Malignant neoplasm of unspecified part of right bronchus or lung: Secondary | ICD-10-CM

## 2015-07-25 DIAGNOSIS — R05 Cough: Secondary | ICD-10-CM | POA: Diagnosis not present

## 2015-07-25 DIAGNOSIS — R5382 Chronic fatigue, unspecified: Secondary | ICD-10-CM

## 2015-07-25 NOTE — Progress Notes (Signed)
[facility] [phone]   [phone]  OFFICE PROGRESS NOTE  GREEN, [name], MD [address] [place] [address]  DIAGNOSIS: Stage IIIA (T3, N1, M0) non-small cell lung cancer, adenocarcinoma with negative EGFR mutation and negative for gene translocation diagnosed in May 2016 in a patient with remote history of smoking. She has multiple satellite nodules that would place her at a higher stage.  [facility] 360 Molecular study: positive for BRCA2, NOTCH1, RAF1 and NF![name]  PRIOR THERAPY: Systemic chemotherapy with carboplatin for an AUC of 5 and Alimta at 500 mg/m given every 3 weeks. Status post 5 cycles. Last dose was given 03/11/2015 with stable disease.   CURRENT THERAPY: None.  INTERVAL HISTORY: [name] Carlson 82 y.o. female returns to the clinic today for follow-up visit accompanied by her son and daughter. The patient is feeling fine today with no specific complaints except for mild fatigue and episodes of dry cough. She has been on observation for the last few months and the patient is feeling fine except for mild shortness breath with exertion but no significant chest pain or hemoptysis. The patient denied having any significant weight loss or night sweats. She has no fever or chills. She has no significant nausea or vomiting. She had repeat CT scan of the chest, abdomen and pelvis performed recently and she is here for evaluation and discussion of her scan results.  MEDICAL HISTORY: Past Medical History  Diagnosis Date  . Hypertension   . External carotid artery stenosis     L side dx'd with doppler 2012  . UTI (lower urinary tract infection)   . Pneumonia   . Thyroid disease   . Cancer (HCC)     ALLERGIES:  is allergic to corticosteroids and minocycline.  MEDICATIONS:  Current Outpatient Prescriptions  Medication Sig Dispense Refill  . nitrofurantoin, macrocrystal-monohydrate, (MACROBID) 100 MG capsule Take 1  capsule (100 mg total) by mouth 2 (two) times daily. 20 capsule 0  . rivaroxaban (XARELTO) 20 MG TABS tablet Take 1 tablet (20 mg total) by mouth daily with supper. 30 tablet 4  . sulfamethoxazole-trimethoprim (BACTRIM DS,SEPTRA DS) 800-160 MG tablet Take 1 tablet by mouth 2 (two) times daily. 14 tablet 0  . verapamil (CALAN-SR) 240 MG CR tablet Take 240 mg by mouth at bedtime.    [name] dexamethasone (DECADRON) 4 MG tablet 4 mg po bid the day before, day of and day after chemotherapy (Patient not taking: Reported on 04/18/2015) 40 tablet 1  . HYDROcodone-acetaminophen (NORCO/VICODIN) 5-325 MG per tablet Take 1 tablet by mouth every 6 (six) hours as needed for moderate pain. (Patient not taking: Reported on 03/04/2015) 20 tablet 0  . levothyroxine (SYNTHROID, LEVOTHROID) 50 MCG tablet Take 50 mcg by mouth daily before breakfast. Reported on 07/25/2015    . losartan (COZAAR) 100 MG tablet Take 100 mg by mouth daily. Reported on 07/25/2015    . prochlorperazine (COMPAZINE) 10 MG tablet Take 1 tablet (10 mg total) by mouth every 6 (six) hours as needed for nausea or vomiting. (Patient not taking: Reported on 03/04/2015) 30 tablet 0   No current facility-administered medications for this visit.    SURGICAL HISTORY:  Past Surgical History  Procedure Laterality Date  . Video bronchoscopy Bilateral 04/06/2014    Procedure: VIDEO BRONCHOSCOPY WITH FLUORO;  Surgeon: Juanito Doom, MD;  Location: [facility];  Service: Cardiopulmonary;  Laterality: Bilateral;  . Eye surgery  REVIEW OF SYSTEMS:  Constitutional: positive for fatigue Eyes: negative Ears, nose, mouth, throat, and face: negative Respiratory: positive for cough and dyspnea on exertion Cardiovascular: negative Gastrointestinal: negative Genitourinary:negative Integument/breast: negative Hematologic/lymphatic: negative Musculoskeletal:negative Neurological: negative Behavioral/Psych: negative Endocrine: negative Allergic/Immunologic:  negative   PHYSICAL EXAMINATION: General appearance: alert, cooperative, fatigued and no distress Head: Normocephalic, without obvious abnormality, atraumatic Neck: no adenopathy, no JVD, supple, symmetrical, trachea midline and thyroid not enlarged, symmetric, no tenderness/mass/nodules Lymph nodes: Cervical, supraclavicular, and axillary nodes normal. Resp: rales RLL Back: symmetric, no curvature. ROM normal. No CVA tenderness. Cardio: regular rate and rhythm, S1, S2 normal, no murmur, click, rub or gallop GI: soft, non-tender; bowel sounds normal; no masses,  no organomegaly Extremities: extremities normal, atraumatic, no cyanosis or edema Neurologic: Alert and oriented X 3, normal strength and tone. Normal symmetric reflexes. Normal coordination and gait  ECOG PERFORMANCE STATUS: 1 - Symptomatic but completely ambulatory  Blood pressure 147/64, pulse 93, temperature 97.8 F (36.6 C), temperature source Oral, resp. rate 17, height '5\' 3"'  (1.6 m), weight 135 lb 8 oz (61.462 kg), SpO2 92 %.  LABORATORY DATA: Lab Results  Component Value Date   WBC 4.7 07/03/2015   HGB 11.3* 07/03/2015   HCT 33.7* 07/03/2015   MCV 99.4 07/03/2015   PLT 171 07/03/2015      Chemistry      Component Value Date/Time   NA 136 07/03/2015 1334   NA 137 03/25/2015 0555   K 4.7 07/03/2015 1334   K 3.7 03/25/2015 0555   CL 108 03/25/2015 0555   CO2 25 07/03/2015 1334   CO2 20* 03/25/2015 0555   BUN 23.3 07/03/2015 1334   BUN 7 03/25/2015 0555   CREATININE 1.2* 07/03/2015 1334   CREATININE 0.86 03/25/2015 0555   CREATININE 0.79 09/10/2014 1439      Component Value Date/Time   CALCIUM 10.1 07/03/2015 1334   CALCIUM 8.3* 03/25/2015 0555   ALKPHOS 122 07/03/2015 1334   ALKPHOS 89 03/24/2015 0426   AST 23 07/03/2015 1334   AST 27 03/24/2015 0426   ALT 14 07/03/2015 1334   ALT 16 03/24/2015 0426   BILITOT 0.51 07/03/2015 1334   BILITOT 0.9 03/24/2015 0426       RADIOGRAPHIC STUDIES: Ct  Chest W Contrast  07/03/2015  CLINICAL DATA:  Lung cancer. EXAM: CT CHEST, ABDOMEN, AND PELVIS WITH CONTRAST TECHNIQUE: Multidetector CT imaging of the chest, abdomen and pelvis was performed following the standard protocol during bolus administration of intravenous contrast. CONTRAST:  11m OMNIPAQUE IOHEXOL 300 MG/ML  SOLN COMPARISON:  04/16/2015 FINDINGS: CT CHEST FINDINGS Mediastinum/Lymph Nodes: There is no axillary lymphadenopathy. New 6 mm short axis precarinal lymph node was 7 mm previously. Previously measured 7 mm subcarinal lymph node is 8 mm today. 8 mm short axis right hilar lymph node measures 7 mm today. No left hilar lymphadenopathy. Heart is enlarged. Coronary artery calcification is noted. No pericardial effusion. The esophagus has normal imaging features. Lungs/Pleura: Chronic interstitial changes again noted. The hypermetabolic disease in the posterior right lower lobe has progressed slightly in the interval. The progression can be seen by comparing the azygoesophageal recess on image 37 series 4 today to image 38 series 4 previously. Progression along the superior margin is demonstrated on image 30 series 4 today compared image 31 series 4 previously. 6 mm satellite nodule measured at 6 mm previously is 8 mm on image 34 series 4 today. Peripheral 8 mm nodule on the previous study is 8 mm today  on image 34 series 4. 6 mm left lower lobe nodule on image 23 is new in the interval. 4 mm left lower lobe pulmonary nodule measured on the previous study is stable. Today there is a new 3 mm left lower lobe nodule seen on image 24 series 4. Musculoskeletal: Bone windows reveal no worrisome lytic or sclerotic osseous lesions. CT ABDOMEN PELVIS FINDINGS Hepatobiliary: No focal abnormality within the liver parenchyma. There is no evidence for gallstones, gallbladder wall thickening, or pericholecystic fluid. No intrahepatic or extrahepatic biliary dilation. Pancreas: No focal mass lesion. No dilatation of  the main duct. No intraparenchymal cyst. No peripancreatic edema. Spleen: No splenomegaly. No focal mass lesion. Adrenals/Urinary Tract: No adrenal nodule or mass. Tiny bilateral cortical lesions (less than 5 mm) in the kidneys are too small to characterize but likely represent tiny cysts. 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 Gas is identified within the lumen of the urinary bladder, presumably secondary to recent instrumentation although infection can produce appearance. Stomach/Bowel: Stomach is nondistended. No gastric wall thickening. No evidence of outlet obstruction. Duodenal diverticulum noted. No small bowel wall thickening. No small bowel dilatation. The terminal ileum is normal. The appendix is normal. Diverticuli are seen scattered along the entire length of the colon without CT findings of diverticulitis. Vascular/Lymphatic: There is abdominal aortic atherosclerosis without aneurysm. There is no gastrohepatic or hepatoduodenal ligament lymphadenopathy. No intraperitoneal or retroperitoneal lymphadenopathy. No pelvic sidewall lymphadenopathy. Reproductive: The uterus has normal CT imaging appearance. There is no adnexal mass. Other: No intraperitoneal free fluid. Musculoskeletal: Bone windows reveal no worrisome lytic or sclerotic osseous lesions. IMPRESSION: 1. Interval progression of right lower lobe lung carcinoma with slight increase in right lower lobe satellite pulmonary nodules. 2. No substantial change and upper normal mediastinal lymphadenopathy. 3. Gas in the urinary bladder may be related to recent instrumentation although infection can produce this appearance. 4. Diffuse colonic diverticulosis without diverticulitis 5. Abdominal aortic atherosclerosis. Electronically Signed   By: Misty Stanley M.D.   On: 07/03/2015 16:04   Ct Abdomen Pelvis W Contrast  07/03/2015  CLINICAL DATA:  Lung cancer.  EXAM: CT CHEST, ABDOMEN, AND PELVIS WITH CONTRAST TECHNIQUE: Multidetector CT imaging of the chest, abdomen and pelvis was performed following the standard protocol during bolus administration of intravenous contrast. CONTRAST:  60m OMNIPAQUE IOHEXOL 300 MG/ML  SOLN COMPARISON:  04/16/2015 FINDINGS: CT CHEST FINDINGS Mediastinum/Lymph Nodes: There is no axillary lymphadenopathy. New 6 mm short axis precarinal lymph node was 7 mm previously. Previously measured 7 mm subcarinal lymph node is 8 mm today. 8 mm short axis right hilar lymph node measures 7 mm today. No left hilar lymphadenopathy. Heart is enlarged. Coronary artery calcification is noted. No pericardial effusion. The esophagus has normal imaging features. Lungs/Pleura: Chronic interstitial changes again noted. The hypermetabolic disease in the posterior right lower lobe has progressed slightly in the interval. The progression can be seen by comparing the azygoesophageal recess on image 37 series 4 today to image 38 series 4 previously. Progression along the superior margin is demonstrated on image 30 series 4 today compared image 31 series 4 previously. 6 mm satellite nodule measured at 6 mm previously is 8 mm on image 34 series 4 today. Peripheral 8 mm nodule on the previous study is 8 mm today on image 34 series 4. 6 mm left lower lobe nodule on image 23 is new in the interval. 4 mm left lower lobe pulmonary  nodule measured on the previous study is stable. Today there is a new 3 mm left lower lobe nodule seen on image 24 series 4. Musculoskeletal: Bone windows reveal no worrisome lytic or sclerotic osseous lesions. CT ABDOMEN PELVIS FINDINGS Hepatobiliary: No focal abnormality within the liver parenchyma. There is no evidence for gallstones, gallbladder wall thickening, or pericholecystic fluid. No intrahepatic or extrahepatic biliary dilation. Pancreas: No focal mass lesion. No dilatation of the main duct. No intraparenchymal cyst. No peripancreatic  edema. Spleen: No splenomegaly. No focal mass lesion. Adrenals/Urinary Tract: No adrenal nodule or mass. Tiny bilateral cortical lesions (less than 5 mm) in the kidneys are too small to characterize but likely represent tiny cysts. 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 Gas is identified within the lumen of the urinary bladder, presumably secondary to recent instrumentation although infection can produce appearance. Stomach/Bowel: Stomach is nondistended. No gastric wall thickening. No evidence of outlet obstruction. Duodenal diverticulum noted. No small bowel wall thickening. No small bowel dilatation. The terminal ileum is normal. The appendix is normal. Diverticuli are seen scattered along the entire length of the colon without CT findings of diverticulitis. Vascular/Lymphatic: There is abdominal aortic atherosclerosis without aneurysm. There is no gastrohepatic or hepatoduodenal ligament lymphadenopathy. No intraperitoneal or retroperitoneal lymphadenopathy. No pelvic sidewall lymphadenopathy. Reproductive: The uterus has normal CT imaging appearance. There is no adnexal mass. Other: No intraperitoneal free fluid. Musculoskeletal: Bone windows reveal no worrisome lytic or sclerotic osseous lesions. IMPRESSION: 1. Interval progression of right lower lobe lung carcinoma with slight increase in right lower lobe satellite pulmonary nodules. 2. No substantial change and upper normal mediastinal lymphadenopathy. 3. Gas in the urinary bladder may be related to recent instrumentation although infection can produce this appearance. 4. Diffuse colonic diverticulosis without diverticulitis 5. Abdominal aortic atherosclerosis. Electronically Signed   By: Misty Stanley M.D.   On: 07/03/2015 16:04    ASSESSMENT AND PLAN: This is a very pleasant 82 years old white female with stage IIIA/B non-small cell lung cancer, adenocarcinoma  involving the right lower lobe with hilar lymphadenopathy as well as several satellite lesions. The molecular study showed no evidence for target mutations. The patient completed treatment with systemic chemotherapy with carboplatin and Alimta status post 5 cycles. Her treatment was discontinued secondary to increasing fatigue and weakness as well as hypotension. She has been observation for the last few months and the patient is feeling fine today except for dry cough and mild shortness of breath with exertion. The recent CT scan of the chest, abdomen and pelvis showed evidence for interval progression of the right lower lobe lung cancer with a slight increase in the right lower lobe satellite pulmonary nodules. I discussed the scan results with the patient and her family. I gave her the option of continuous observation and close monitoring as well as palliative care versus consideration of treatment with immunotherapy with Nivolumab 240 mg IV every 2 weeks. I discussed with the patient adverse effect of this treatment including but not limited to immune mediated pneumonitis, nephritis, hepatitis, skin rash, thyroid or other endocrine dysfunction. The patient would like some time to think about her option and she will call the office in the next few days with her decision. If she decided against treatment I would see her back for follow-up visit in 3 months with repeat CT scan of the chest for restaging of her disease. She was advised to call immediately if she  has any concerning symptoms in the interval. The patient voices understanding of current disease status and treatment options and is in agreement with the current care plan.  All questions were answered. The patient knows to call the clinic with any problems, questions or concerns. We can certainly see the patient much sooner if necessary.  Disclaimer: This note was dictated with voice recognition software. Similar sounding words can  inadvertently be transcribed and may not be corrected upon review.

## 2015-07-31 ENCOUNTER — Telehealth: Payer: Self-pay | Admitting: *Deleted

## 2015-07-31 NOTE — Telephone Encounter (Signed)
Patient called to say that she wants to go ahead with her new treatment.

## 2015-08-01 ENCOUNTER — Telehealth: Payer: Self-pay | Admitting: Internal Medicine

## 2015-08-01 ENCOUNTER — Telehealth: Payer: Self-pay | Admitting: *Deleted

## 2015-08-01 NOTE — Telephone Encounter (Signed)
S/w pt confirming chemo education class and to pick up schedule day of chemo class.... Per 02/15 POF... KJ

## 2015-08-01 NOTE — Telephone Encounter (Signed)
Per staff message and POF I have scheduled appts. Advised scheduler of appts. JMW  

## 2015-08-02 ENCOUNTER — Telehealth: Payer: Self-pay | Admitting: Internal Medicine

## 2015-08-02 NOTE — Telephone Encounter (Signed)
S/w pt confirming labs/chemo added and she will p/u schedule when she comes in for her Chemo Edu class...  KJ

## 2015-08-06 ENCOUNTER — Other Ambulatory Visit: Payer: Medicare Other

## 2015-08-06 ENCOUNTER — Encounter: Payer: Self-pay | Admitting: *Deleted

## 2015-08-07 ENCOUNTER — Telehealth: Payer: Self-pay | Admitting: Internal Medicine

## 2015-08-07 ENCOUNTER — Other Ambulatory Visit: Payer: Self-pay | Admitting: Medical Oncology

## 2015-08-07 ENCOUNTER — Other Ambulatory Visit: Payer: Medicare Other

## 2015-08-07 ENCOUNTER — Telehealth: Payer: Self-pay | Admitting: *Deleted

## 2015-08-07 ENCOUNTER — Telehealth: Payer: Self-pay | Admitting: Medical Oncology

## 2015-08-07 ENCOUNTER — Ambulatory Visit: Payer: Medicare Other

## 2015-08-07 NOTE — Telephone Encounter (Signed)
per pof pt cld to cx infusion for today and 3/8-appts cx

## 2015-08-07 NOTE — Telephone Encounter (Signed)
 "  I'm calling to cancel my appointments for treatment today."  Asked if she is feeling okay and she denies any illness.  Offered for a scheduler to call her to reschedule. Denies need stating she "will call back to reschedule".  Advised the cycle may affect cycle plan scheduled in March.  "Go ahead and cancel the March appointments also."  P.O.F. Generated.

## 2015-08-07 NOTE — Telephone Encounter (Signed)
F/u pt call to cancel appts . She has read the  materials and so have children and they told her to think about it and she said I thought about it and prayed about this and " I don't want to have the treatment." She does want f/u with [facility]. Note to mohamed.

## 2015-08-07 NOTE — Telephone Encounter (Signed)
per pof to cancel lab 3/8 per pt-lab cx

## 2015-08-21 ENCOUNTER — Ambulatory Visit: Payer: Medicare Other

## 2015-08-21 ENCOUNTER — Other Ambulatory Visit: Payer: Medicare Other

## 2015-09-25 ENCOUNTER — Other Ambulatory Visit: Payer: Self-pay | Admitting: Internal Medicine

## 2015-11-07 ENCOUNTER — Telehealth: Payer: Self-pay | Admitting: Medical Oncology

## 2015-11-07 NOTE — Telephone Encounter (Signed)
appt due this month with CT -Note to [facility].

## 2015-11-08 ENCOUNTER — Telehealth: Payer: Self-pay | Admitting: *Deleted

## 2015-11-08 ENCOUNTER — Other Ambulatory Visit: Payer: Self-pay | Admitting: Medical Oncology

## 2015-11-08 DIAGNOSIS — C3491 Malignant neoplasm of unspecified part of right bronchus or lung: Secondary | ICD-10-CM

## 2015-11-08 NOTE — Telephone Encounter (Signed)
"  I need to make an appointment for scans and to be evaluated by Dr. Julien Nordmann after scans."  Provided November 19, 2015 appointment information and the Radiology Centralized Scheduling number [phone] to have CT C/A/P scheduled no later than 11-17-2015.  Instructed to obtain scan contrast at [facility] or [facility] before scan.

## 2015-11-13 ENCOUNTER — Telehealth: Payer: Self-pay | Admitting: *Deleted

## 2015-11-13 NOTE — Telephone Encounter (Signed)
Fax from radiology with request ot move lab appt from 6/5 to 6/1. POF to scheduling

## 2015-11-14 ENCOUNTER — Encounter (HOSPITAL_COMMUNITY): Payer: Self-pay

## 2015-11-14 ENCOUNTER — Ambulatory Visit (HOSPITAL_COMMUNITY)
Admission: RE | Admit: 2015-11-14 | Discharge: 2015-11-14 | Disposition: A | Discharge status: Home / Self Care | Payer: Medicare Other | Source: Ambulatory Visit | Attending: Internal Medicine | Admitting: Internal Medicine

## 2015-11-14 ENCOUNTER — Other Ambulatory Visit (HOSPITAL_BASED_OUTPATIENT_CLINIC_OR_DEPARTMENT_OTHER): Payer: Medicare Other

## 2015-11-14 DIAGNOSIS — K5730 Diverticulosis of large intestine without perforation or abscess without bleeding: Secondary | ICD-10-CM | POA: Diagnosis not present

## 2015-11-14 DIAGNOSIS — I7090 Unspecified atherosclerosis: Secondary | ICD-10-CM | POA: Insufficient documentation

## 2015-11-14 DIAGNOSIS — Z923 Personal history of irradiation: Secondary | ICD-10-CM | POA: Diagnosis not present

## 2015-11-14 DIAGNOSIS — C3431 Malignant neoplasm of lower lobe, right bronchus or lung: Secondary | ICD-10-CM | POA: Diagnosis not present

## 2015-11-14 DIAGNOSIS — R918 Other nonspecific abnormal finding of lung field: Secondary | ICD-10-CM | POA: Insufficient documentation

## 2015-11-14 DIAGNOSIS — C3491 Malignant neoplasm of unspecified part of right bronchus or lung: Secondary | ICD-10-CM | POA: Diagnosis not present

## 2015-11-14 DIAGNOSIS — K5641 Fecal impaction: Secondary | ICD-10-CM | POA: Diagnosis not present

## 2015-11-14 LAB — COMPREHENSIVE METABOLIC PANEL
ALT: 10 U/L (ref 0–55)
AST: 17 U/L (ref 5–34)
Albumin: 4.1 g/dL (ref 3.5–5.0)
Alkaline Phosphatase: 92 U/L (ref 40–150)
Anion Gap: 8 mEq/L (ref 3–11)
BILIRUBIN TOTAL: 0.58 mg/dL (ref 0.20–1.20)
BUN: 27.5 mg/dL — AB (ref 7.0–26.0)
CHLORIDE: 102 meq/L (ref 98–109)
CO2: 26 meq/L (ref 22–29)
CREATININE: 1.1 mg/dL (ref 0.6–1.1)
Calcium: 10.2 mg/dL (ref 8.4–10.4)
EGFR: 43 mL/min/{1.73_m2} — ABNORMAL LOW (ref >90)
Glucose: 71 mg/dl (ref 70–140)
Potassium: 4.4 mEq/L (ref 3.5–5.1)
Sodium: 135 mEq/L — ABNORMAL LOW (ref 136–145)
TOTAL PROTEIN: 7.8 g/dL (ref 6.4–8.3)

## 2015-11-14 LAB — CBC WITH DIFFERENTIAL/PLATELET
BASO%: 0.5 % (ref 0.0–2.0)
Basophils Absolute: 0 10*3/uL (ref 0.0–0.1)
EOS%: 3.3 % (ref 0.0–7.0)
Eosinophils Absolute: 0.2 10*3/uL (ref 0.0–0.5)
HEMATOCRIT: 35.9 % (ref 34.8–46.6)
HGB: 11.9 g/dL (ref 11.6–15.9)
LYMPH#: 1.4 10*3/uL (ref 0.9–3.3)
LYMPH%: 26.5 % (ref 14.0–49.7)
MCH: 31.4 pg (ref 25.1–34.0)
MCHC: 33.1 g/dL (ref 31.5–36.0)
MCV: 94.8 fL (ref 79.5–101.0)
MONO#: 0.7 10*3/uL (ref 0.1–0.9)
MONO%: 13.1 % (ref 0.0–14.0)
NEUT%: 56.6 % (ref 38.4–76.8)
NEUTROS ABS: 3 10*3/uL (ref 1.5–6.5)
PLATELETS: 156 10*3/uL (ref 145–400)
RBC: 3.79 10*6/uL (ref 3.70–5.45)
RDW: 14.2 % (ref 11.2–14.5)
WBC: 5.3 10*3/uL (ref 3.9–10.3)

## 2015-11-14 MED ORDER — IOPAMIDOL (ISOVUE-300) INJECTION 61%
100.0000 mL | Freq: Once | INTRAVENOUS | Status: AC | PRN
  Administered 2015-11-14: 65 mL via INTRAVENOUS

## 2015-11-18 ENCOUNTER — Telehealth: Payer: Self-pay | Admitting: Internal Medicine

## 2015-11-18 ENCOUNTER — Encounter: Payer: Self-pay | Admitting: Internal Medicine

## 2015-11-18 ENCOUNTER — Ambulatory Visit (HOSPITAL_BASED_OUTPATIENT_CLINIC_OR_DEPARTMENT_OTHER): Payer: Medicare Other | Admitting: Internal Medicine

## 2015-11-18 ENCOUNTER — Other Ambulatory Visit: Payer: Medicare Other

## 2015-11-18 VITALS — BP 178/52 | HR 70 | Temp 97.6°F | Resp 18 | Ht 63.0 in | Wt 136.1 lb

## 2015-11-18 DIAGNOSIS — C3431 Malignant neoplasm of lower lobe, right bronchus or lung: Secondary | ICD-10-CM

## 2015-11-18 DIAGNOSIS — C3491 Malignant neoplasm of unspecified part of right bronchus or lung: Secondary | ICD-10-CM

## 2015-11-18 NOTE — Telephone Encounter (Signed)
Gave and printed appt sched and avs for pt for Sept °

## 2015-11-18 NOTE — Progress Notes (Signed)
[name] [phone]) [phone]   Fax:([phone]  OFFICE PROGRESS NOTE  [name], [name] Bachelor, MD [address] [place] [address]  DIAGNOSIS: Stage IIIA (T3, N1, M0) non-small cell lung cancer, adenocarcinoma with negative EGFR mutation and negative for gene translocation diagnosed in May 2016 in a patient with remote history of smoking. She has multiple satellite nodules that would place her at a higher stage.  GUARDANT 360 Molecular study: positive for BRCA2, NOTCH1, RAF1 and NF![name]  PRIOR THERAPY: Systemic chemotherapy with carboplatin for an AUC of 5 and Alimta at 500 mg/m given every 3 weeks. Status post 5 cycles. Last dose was given 03/11/2015 with stable disease.   CURRENT THERAPY: Observation.  INTERVAL HISTORY: [name] Carlson 82 y.o. female returns to the clinic today for follow-up visit accompanied by her daughter. The patient is feeling fine today with no specific complaints except for mild fatigue and dry cough. She has been on observation for the last 9 months and the patient is feeling fine. She denied having any significant chest pain, shortness breath or hemoptysis. The patient denied having any significant weight loss or night sweats. She has no fever or chills. She has no significant nausea or vomiting. She had repeat CT scan of the chest, abdomen and pelvis performed recently and she is here for evaluation and discussion of her scan results.  MEDICAL HISTORY: Past Medical History  Diagnosis Date  . Hypertension   . External carotid artery stenosis     L side dx'd with doppler 2012  . UTI (lower urinary tract infection)   . Pneumonia   . Thyroid disease   . Cancer ([facility])     ALLERGIES:  is allergic to corticosteroids and minocycline.  MEDICATIONS:  Current Outpatient Prescriptions  Medication Sig Dispense Refill  . levothyroxine (SYNTHROID, LEVOTHROID) 50 MCG tablet Take 50 mcg by mouth daily before breakfast. Reported  on 07/25/2015    . losartan (COZAAR) 100 MG tablet Take 100 mg by mouth daily. Reported on 07/25/2015    . verapamil (CALAN-SR) 240 MG CR tablet Take 240 mg by mouth at bedtime.    Alveda Reasons 20 MG TABS tablet TAKE 1 TABLET EVERY DAY WITH SUPPER 30 tablet 4   No current facility-administered medications for this visit.    SURGICAL HISTORY:  Past Surgical History  Procedure Laterality Date  . Video bronchoscopy Bilateral 04/06/2014    Procedure: VIDEO BRONCHOSCOPY WITH FLUORO;  Surgeon: Juanito Doom, MD;  Location: [facility];  Service: Cardiopulmonary;  Laterality: Bilateral;  . Eye surgery      REVIEW OF SYSTEMS:  A comprehensive review of systems was negative except for: Constitutional: positive for fatigue Respiratory: positive for cough   PHYSICAL EXAMINATION: General appearance: alert, cooperative, fatigued and no distress Head: Normocephalic, without obvious abnormality, atraumatic Neck: no adenopathy, no JVD, supple, symmetrical, trachea midline and thyroid not enlarged, symmetric, no tenderness/mass/nodules Lymph nodes: Cervical, supraclavicular, and axillary nodes normal. Resp: rales RLL Back: symmetric, no curvature. ROM normal. No CVA tenderness. Cardio: regular rate and rhythm, S1, S2 normal, no murmur, click, rub or gallop GI: soft, non-tender; bowel sounds normal; no masses,  no organomegaly Extremities: extremities normal, atraumatic, no cyanosis or edema Neurologic: Alert and oriented X 3, normal strength and tone. Normal symmetric reflexes. Normal coordination and gait  ECOG PERFORMANCE STATUS: 1 - Symptomatic but completely ambulatory  Blood pressure 178/52, pulse 70, temperature 97.6 F (36.4 C), temperature source Oral, resp.  rate 18, height '5\' 3"'  (1.6 m), weight 136 lb 1.6 oz (61.735 kg), SpO2 97 %.  LABORATORY DATA: Lab Results  Component Value Date   WBC 5.3 11/14/2015   HGB 11.9 11/14/2015   HCT 35.9 11/14/2015   MCV 94.8 11/14/2015   PLT 156  11/14/2015      Chemistry      Component Value Date/Time   NA 135* 11/14/2015 1236   NA 137 03/25/2015 0555   K 4.4 11/14/2015 1236   K 3.7 03/25/2015 0555   CL 108 03/25/2015 0555   CO2 26 11/14/2015 1236   CO2 20* 03/25/2015 0555   BUN 27.5* 11/14/2015 1236   BUN 7 03/25/2015 0555   CREATININE 1.1 11/14/2015 1236   CREATININE 0.86 03/25/2015 0555   CREATININE 0.79 09/10/2014 1439      Component Value Date/Time   CALCIUM 10.2 11/14/2015 1236   CALCIUM 8.3* 03/25/2015 0555   ALKPHOS 92 11/14/2015 1236   ALKPHOS 89 03/24/2015 0426   AST 17 11/14/2015 1236   AST 27 03/24/2015 0426   ALT 10 11/14/2015 1236   ALT 16 03/24/2015 0426   BILITOT 0.58 11/14/2015 1236   BILITOT 0.9 03/24/2015 0426       RADIOGRAPHIC STUDIES: Ct Chest W Contrast  11/14/2015  CLINICAL DATA:  Right lung cancer (non-small cell) diagnosed in May 2016, chemotherapy completed. EXAM: CT CHEST, ABDOMEN, AND PELVIS WITH CONTRAST TECHNIQUE: Multidetector CT imaging of the chest, abdomen and pelvis was performed following the standard protocol during bolus administration of intravenous contrast. CONTRAST:  36m ISOVUE-300 IOPAMIDOL (ISOVUE-300) INJECTION 61% COMPARISON:  07/03/2015 FINDINGS: CT CHEST FINDINGS Mediastinum/Nodes: 0.8 cm right lower paratracheal lymph node, image 23/2. 0.9 cm right hilar lymph node, image 28/2. No overtly pathologic thoracic adenopathy identified. Coronary, aortic arch, and branch vessel atherosclerotic vascular disease. A ectatic ascending thoracic aorta at 3.7 cm. No overt aneurysm. Mild cardiomegaly.  Calcification of the mitral valve. Lungs/Pleura: Progressive peripheral interstitial accentuation favoring fibrosis. Triangular density in the right upper lobe peripherally on image 46 series 5 measures 1.2 by 1.6 cm, previously 0.7 by 0.8 cm. This is a example of a more general process of increasing scattered nodularity in the lungs, especially in the subpleural regions. For example  another nodule, in the left lower lobe measures 1.4 by 0.8 cm, previously 0.8 by 0.5 cm. Numerous additional subpleural and parenchymal nodules are increased. Considerable consolidation in the right lower lobe, stable. Musculoskeletal: Thoracic spondylosis. CT ABDOMEN PELVIS FINDINGS Hepatobiliary: Contracted gallbladder. Pancreas: Unremarkable Spleen: Unremarkable Adrenals/Urinary Tract: Unremarkable Stomach/Bowel: Prominent stool throughout the colon favors constipation. Sigmoid colon diverticulosis. Vascular/Lymphatic: Aortoiliac atherosclerotic vascular disease. No pathologic adenopathy identified. Reproductive: Unremarkable Other: No supplemental non-categorized findings. Musculoskeletal: Degenerative arthropathy of both hips. Degenerative endplate findings eccentric to the right at L5-S1. IMPRESSION: 1. Consolidation in the right lower lobe is stable and could represent radiation pneumonitis and/or tumor infiltration, although there extensive air bronchograms. There is an increase in size and number of subpleural and scattered peripheral nodules in both lungs. Some of these are intraparenchymal and most subpleural. This would be a strange distribution for metastatic disease, and more likely represents atypical infectious process or nonspecific interstitial pneumonia, or fungal or mycobacterial infectious process. That said, there are numerous bilateral enlarging nodules and 1 or more of these could represent metastatic disease. These will merit close surveillance. 2. Coronary, aortic arch, and branch vessel atherosclerotic vascular disease. 3. Atherosclerosis. 4.  Prominent stool throughout the colon favors constipation. 5. Sigmoid colon diverticulosis.  6. Degenerative arthropathy of both hips. Electronically Signed   By: Van Clines M.D.   On: 11/14/2015 15:52   Ct Abdomen Pelvis W Contrast  11/14/2015  CLINICAL DATA:  Right lung cancer (non-small cell) diagnosed in May 2016, chemotherapy completed.  EXAM: CT CHEST, ABDOMEN, AND PELVIS WITH CONTRAST TECHNIQUE: Multidetector CT imaging of the chest, abdomen and pelvis was performed following the standard protocol during bolus administration of intravenous contrast. CONTRAST:  27m ISOVUE-300 IOPAMIDOL (ISOVUE-300) INJECTION 61% COMPARISON:  07/03/2015 FINDINGS: CT CHEST FINDINGS Mediastinum/Nodes: 0.8 cm right lower paratracheal lymph node, image 23/2. 0.9 cm right hilar lymph node, image 28/2. No overtly pathologic thoracic adenopathy identified. Coronary, aortic arch, and branch vessel atherosclerotic vascular disease. A ectatic ascending thoracic aorta at 3.7 cm. No overt aneurysm. Mild cardiomegaly.  Calcification of the mitral valve. Lungs/Pleura: Progressive peripheral interstitial accentuation favoring fibrosis. Triangular density in the right upper lobe peripherally on image 46 series 5 measures 1.2 by 1.6 cm, previously 0.7 by 0.8 cm. This is a example of a more general process of increasing scattered nodularity in the lungs, especially in the subpleural regions. For example another nodule, in the left lower lobe measures 1.4 by 0.8 cm, previously 0.8 by 0.5 cm. Numerous additional subpleural and parenchymal nodules are increased. Considerable consolidation in the right lower lobe, stable. Musculoskeletal: Thoracic spondylosis. CT ABDOMEN PELVIS FINDINGS Hepatobiliary: Contracted gallbladder. Pancreas: Unremarkable Spleen: Unremarkable Adrenals/Urinary Tract: Unremarkable Stomach/Bowel: Prominent stool throughout the colon favors constipation. Sigmoid colon diverticulosis. Vascular/Lymphatic: Aortoiliac atherosclerotic vascular disease. No pathologic adenopathy identified. Reproductive: Unremarkable Other: No supplemental non-categorized findings. Musculoskeletal: Degenerative arthropathy of both hips. Degenerative endplate findings eccentric to the right at L5-S1. IMPRESSION: 1. Consolidation in the right lower lobe is stable and could represent  radiation pneumonitis and/or tumor infiltration, although there extensive air bronchograms. There is an increase in size and number of subpleural and scattered peripheral nodules in both lungs. Some of these are intraparenchymal and most subpleural. This would be a strange distribution for metastatic disease, and more likely represents atypical infectious process or nonspecific interstitial pneumonia, or fungal or mycobacterial infectious process. That said, there are numerous bilateral enlarging nodules and 1 or more of these could represent metastatic disease. These will merit close surveillance. 2. Coronary, aortic arch, and branch vessel atherosclerotic vascular disease. 3. Atherosclerosis. 4.  Prominent stool throughout the colon favors constipation. 5. Sigmoid colon diverticulosis. 6. Degenerative arthropathy of both hips. Electronically Signed   By: WVan ClinesM.D.   On: 11/14/2015 15:52    ASSESSMENT AND PLAN: This is a very pleasant 82years old white female with stage IIIA/B non-small cell lung cancer, adenocarcinoma involving the right lower lobe with hilar lymphadenopathy as well as several satellite lesions. The molecular study showed no evidence for target mutations. The patient completed treatment with systemic chemotherapy with carboplatin and Alimta status post 5 cycles. Her treatment was discontinued secondary to increasing fatigue and weakness as well as hypotension. She has been observation for the last 9 months and the patient is feeling fine today except for dry cough and fatigue.  The recent CT scan of the chest, abdomen and pelvis showed stable disease except for mildly enlarged subpleural nodules in the left and right lung. I discussed the scan results with the patient and her daughter.  I recommended for the patient to continue on observation for now. I would see her back for follow-up visit in 3 months with repeat CT scan of the chest for restaging of her disease.  She was  advised to call immediately if she has any concerning symptoms in the interval. The patient voices understanding of current disease status and treatment options and is in agreement with the current care plan.  All questions were answered. The patient knows to call the clinic with any problems, questions or concerns. We can certainly see the patient much sooner if necessary.  Disclaimer: This note was dictated with voice recognition software. Similar sounding words can inadvertently be transcribed and may not be corrected upon review.

## 2016-02-18 ENCOUNTER — Ambulatory Visit (HOSPITAL_COMMUNITY)
Admission: RE | Admit: 2016-02-18 | Discharge: 2016-02-18 | Disposition: A | Discharge status: Home / Self Care | Payer: Medicare Other | Source: Ambulatory Visit | Attending: Internal Medicine | Admitting: Internal Medicine

## 2016-02-18 ENCOUNTER — Telehealth: Payer: Self-pay | Admitting: *Deleted

## 2016-02-18 ENCOUNTER — Other Ambulatory Visit (HOSPITAL_BASED_OUTPATIENT_CLINIC_OR_DEPARTMENT_OTHER): Payer: Medicare Other

## 2016-02-18 ENCOUNTER — Encounter (HOSPITAL_COMMUNITY): Payer: Self-pay

## 2016-02-18 DIAGNOSIS — C7802 Secondary malignant neoplasm of left lung: Secondary | ICD-10-CM | POA: Insufficient documentation

## 2016-02-18 DIAGNOSIS — I517 Cardiomegaly: Secondary | ICD-10-CM | POA: Diagnosis not present

## 2016-02-18 DIAGNOSIS — I2510 Atherosclerotic heart disease of native coronary artery without angina pectoris: Secondary | ICD-10-CM | POA: Insufficient documentation

## 2016-02-18 DIAGNOSIS — C3491 Malignant neoplasm of unspecified part of right bronchus or lung: Secondary | ICD-10-CM | POA: Diagnosis not present

## 2016-02-18 DIAGNOSIS — C3431 Malignant neoplasm of lower lobe, right bronchus or lung: Secondary | ICD-10-CM

## 2016-02-18 DIAGNOSIS — I700 Atherosclerosis of aorta: Secondary | ICD-10-CM | POA: Diagnosis not present

## 2016-02-18 LAB — COMPREHENSIVE METABOLIC PANEL
ALBUMIN: 3.7 g/dL (ref 3.5–5.0)
ALK PHOS: 91 U/L (ref 40–150)
ALT: 12 U/L (ref 0–55)
AST: 18 U/L (ref 5–34)
Anion Gap: 10 mEq/L (ref 3–11)
BILIRUBIN TOTAL: 0.54 mg/dL (ref 0.20–1.20)
BUN: 23.7 mg/dL (ref 7.0–26.0)
CO2: 22 meq/L (ref 22–29)
CREATININE: 1 mg/dL (ref 0.6–1.1)
Calcium: 9.7 mg/dL (ref 8.4–10.4)
Chloride: 104 mEq/L (ref 98–109)
EGFR: 50 mL/min/{1.73_m2} — AB (ref >90)
GLUCOSE: 84 mg/dL (ref 70–140)
Potassium: 3.8 mEq/L (ref 3.5–5.1)
SODIUM: 137 meq/L (ref 136–145)
TOTAL PROTEIN: 7.3 g/dL (ref 6.4–8.3)

## 2016-02-18 LAB — CBC WITH DIFFERENTIAL/PLATELET
BASO%: 0.6 % (ref 0.0–2.0)
Basophils Absolute: 0 10*3/uL (ref 0.0–0.1)
EOS ABS: 0.2 10*3/uL (ref 0.0–0.5)
EOS%: 4.2 % (ref 0.0–7.0)
HCT: 33.3 % — ABNORMAL LOW (ref 34.8–46.6)
HEMOGLOBIN: 11.1 g/dL — AB (ref 11.6–15.9)
LYMPH%: 19.9 % (ref 14.0–49.7)
MCH: 31.6 pg (ref 25.1–34.0)
MCHC: 33.4 g/dL (ref 31.5–36.0)
MCV: 94.5 fL (ref 79.5–101.0)
MONO#: 0.8 10*3/uL (ref 0.1–0.9)
MONO%: 17.5 % — AB (ref 0.0–14.0)
NEUT%: 57.8 % (ref 38.4–76.8)
NEUTROS ABS: 2.7 10*3/uL (ref 1.5–6.5)
Platelets: 160 10*3/uL (ref 145–400)
RBC: 3.52 10*6/uL — AB (ref 3.70–5.45)
RDW: 13.7 % (ref 11.2–14.5)
WBC: 4.6 10*3/uL (ref 3.9–10.3)
lymph#: 0.9 10*3/uL (ref 0.9–3.3)

## 2016-02-18 MED ORDER — IOPAMIDOL (ISOVUE-300) INJECTION 61%
75.0000 mL | Freq: Once | INTRAVENOUS | Status: AC | PRN
  Administered 2016-02-18: 75 mL via INTRAVENOUS

## 2016-02-18 NOTE — Telephone Encounter (Signed)
"  [facility] Scheduling calling about this patient who need a STAT CT scan today.  She needs more recent labs drawn today before she arrives at 2:00 pm to drink contrast." Advised patient come in at 12:45 for STAT labs.  Scheduling POF message sent.

## 2016-02-19 ENCOUNTER — Other Ambulatory Visit: Payer: Medicare Other

## 2016-02-25 ENCOUNTER — Telehealth: Payer: Self-pay | Admitting: Internal Medicine

## 2016-02-25 NOTE — Telephone Encounter (Signed)
Patient notified of 02/26/2016 appointment changes.

## 2016-02-26 ENCOUNTER — Encounter: Payer: Self-pay | Admitting: Internal Medicine

## 2016-02-26 ENCOUNTER — Telehealth: Payer: Self-pay | Admitting: Internal Medicine

## 2016-02-26 ENCOUNTER — Ambulatory Visit (HOSPITAL_BASED_OUTPATIENT_CLINIC_OR_DEPARTMENT_OTHER): Payer: Medicare Other | Admitting: Internal Medicine

## 2016-02-26 VITALS — BP 154/52 | HR 76 | Temp 97.5°F | Resp 18 | Ht 63.0 in | Wt 133.3 lb

## 2016-02-26 DIAGNOSIS — C3431 Malignant neoplasm of lower lobe, right bronchus or lung: Secondary | ICD-10-CM | POA: Diagnosis not present

## 2016-02-26 DIAGNOSIS — C3491 Malignant neoplasm of unspecified part of right bronchus or lung: Secondary | ICD-10-CM

## 2016-02-26 NOTE — Telephone Encounter (Signed)
Avs report and appointment schedule given to patient, per 02/26/16 los

## 2016-02-26 NOTE — Progress Notes (Signed)
[place] [phone]   Fax:(336) [phone]  OFFICE PROGRESS NOTE  GREEN, [name], MD [address] [place] [address]  DIAGNOSIS: Stage IIIA (T3, N1, M0) non-small cell lung cancer, adenocarcinoma with negative EGFR mutation and negative for gene translocation diagnosed in May 2016 in a patient with remote history of smoking. She has multiple satellite nodules that would place her at a higher stage.  GUARDANT 360 Molecular study: positive for BRCA2, NOTCH1, RAF1 and NF![name]  PRIOR THERAPY: Systemic chemotherapy with carboplatin for an AUC of 5 and Alimta at 500 mg/m given every 3 weeks. Status post 5 cycles. Last dose was given 03/11/2015 with stable disease.   CURRENT THERAPY: Observation.  INTERVAL HISTORY: [name] Carlson 82 y.o. female returns to the clinic today for follow-up visit accompanied by her daughter. The patient is feeling fine today with no specific complaints. She has been on observation for the last 12 months and no significant change since her last visit. She denied having any significant chest pain, shortness of breath or hemoptysis. The patient denied having any significant weight loss or night sweats. She has no fever or chills. She has no significant nausea or vomiting. She had repeat CT scan of the chest, abdomen and pelvis performed recently and she is here for evaluation and discussion of her scan results.  MEDICAL HISTORY: Past Medical History:  Diagnosis Date  . Cancer ([facility])   . External carotid artery stenosis    L side dx'd with doppler 2012  . Hypertension   . Pneumonia   . Thyroid disease   . UTI (lower urinary tract infection)     ALLERGIES:  is allergic to corticosteroids and minocycline.  MEDICATIONS:  Current Outpatient Prescriptions  Medication Sig Dispense Refill  . levothyroxine (SYNTHROID, LEVOTHROID) 50 MCG tablet Take 50 mcg by mouth daily before breakfast. Reported on 07/25/2015    .  losartan (COZAAR) 100 MG tablet Take 100 mg by mouth daily. Reported on 07/25/2015    . verapamil (CALAN-SR) 240 MG CR tablet Take 240 mg by mouth at bedtime.    Alveda Reasons 20 MG TABS tablet TAKE 1 TABLET EVERY DAY WITH SUPPER 30 tablet 4   No current facility-administered medications for this visit.     SURGICAL HISTORY:  Past Surgical History:  Procedure Laterality Date  . EYE SURGERY    . VIDEO BRONCHOSCOPY Bilateral 04/06/2014   Procedure: VIDEO BRONCHOSCOPY WITH FLUORO;  Surgeon: Juanito Doom, MD;  Location: [facility];  Service: Cardiopulmonary;  Laterality: Bilateral;    REVIEW OF SYSTEMS:  A comprehensive review of systems was negative.   PHYSICAL EXAMINATION: General appearance: alert, cooperative, fatigued and no distress Head: Normocephalic, without obvious abnormality, atraumatic Neck: no adenopathy, no JVD, supple, symmetrical, trachea midline and thyroid not enlarged, symmetric, no tenderness/mass/nodules Lymph nodes: Cervical, supraclavicular, and axillary nodes normal. Resp: rales RLL Back: symmetric, no curvature. ROM normal. No CVA tenderness. Cardio: regular rate and rhythm, S1, S2 normal, no murmur, click, rub or gallop GI: soft, non-tender; bowel sounds normal; no masses,  no organomegaly Extremities: extremities normal, atraumatic, no cyanosis or edema Neurologic: Alert and oriented X 3, normal strength and tone. Normal symmetric reflexes. Normal coordination and gait  ECOG PERFORMANCE STATUS: 1 - Symptomatic but completely ambulatory  There were no vitals taken for this visit.  LABORATORY DATA: Lab Results  Component Value Date   WBC 4.6 02/18/2016   HGB 11.1 (L) 02/18/2016  HCT 33.3 (L) 02/18/2016   MCV 94.5 02/18/2016   PLT 160 02/18/2016      Chemistry      Component Value Date/Time   NA 137 02/18/2016 1310   K 3.8 02/18/2016 1310   CL 108 03/25/2015 0555   CO2 22 02/18/2016 1310   BUN 23.7 02/18/2016 1310   CREATININE 1.0 02/18/2016  1310      Component Value Date/Time   CALCIUM 9.7 02/18/2016 1310   ALKPHOS 91 02/18/2016 1310   AST 18 02/18/2016 1310   ALT 12 02/18/2016 1310   BILITOT 0.54 02/18/2016 1310       RADIOGRAPHIC STUDIES: Ct Chest W Contrast  Result Date: 02/19/2016 CLINICAL DATA:  82 year old female with history of lung cancer diagnosed in May 2016, who completed chemotherapy in September 2016. Recent history of dry cough and fatigue. EXAM: CT CHEST WITH CONTRAST TECHNIQUE: Multidetector CT imaging of the chest was performed during intravenous contrast administration. CONTRAST:  71m ISOVUE-300 IOPAMIDOL (ISOVUE-300) INJECTION 61% COMPARISON:  Chest CT 11/14/2015. FINDINGS: Cardiovascular: Heart size is mildly enlarged. There is no significant pericardial fluid, thickening or pericardial calcification. There is aortic atherosclerosis, as well as atherosclerosis of the great vessels of the mediastinum and the coronary arteries, including calcified atherosclerotic plaque in the left main, left anterior descending and left circumflex coronary arteries. Calcifications of the mitral valve, mitral annulus, and mitral subvalvular apparatus. Mediastinum/Nodes: Multiple prominent borderline enlarged and mildly enlarged mediastinal and hilar lymph nodes, largest of which is a 1 cm subcarinal lymph node. Esophagus is normal in appearance. No axillary lymphadenopathy. Lungs/Pleura: Extensive consolidative opacity in the right lower lobe compatible with known adenocarcinoma appears slightly more extensive than prior examinations. The multifocal mixed solid and ground-glass attenuation nodules scattered throughout the lungs bilaterally have also increased in number and size. Specifically, the previously noted lesion in the periphery of the right upper lobe has increased in size to 1.8 x 1.5 cm (previously 1.2 x 1.6 cm) and is more solid in appearance than the prior study (image 48 of series 5). An adjacent 11 x 11 mm nodule (image  53 of series 5) has also significantly increased in size (previously only 5 x 7 mm). Amongst the largest lesions in the left lung there is a 2.0 x 1.7 cm lesion in the left lower lobe (image 70 of series 5) which previously measured only 1.3 x 0.8 cm on 11/14/2015. Several new lesions are also noted. Mild diffuse bronchial wall thickening with mild paraseptal emphysema. Nodular thickening of the fissures is also noted. Diffuse subpleural reticulation. No pleural effusions. Upper Abdomen: Aortic atherosclerosis. Musculoskeletal: There are no aggressive appearing lytic or blastic lesions noted in the visualized portions of the skeleton. IMPRESSION: 1. Today's study demonstrates clear evidence for progression of widespread metastatic disease throughout the lungs bilaterally, as detailed above. 2. Aortic atherosclerosis, in addition to left main and 2 vessel coronary artery disease. 3. Mild cardiomegaly. 4. Additional incidental findings, as above. Electronically Signed   By: DVinnie LangtonM.D.   On: 02/19/2016 08:33    ASSESSMENT AND PLAN: This is a very pleasant 82years old white female with stage IIIA/B non-small cell lung cancer, adenocarcinoma involving the right lower lobe with hilar lymphadenopathy as well as several satellite lesions. The molecular study showed no evidence for target mutations. The patient completed treatment with systemic chemotherapy with carboplatin and Alimta status post 5 cycles. Her treatment was discontinued secondary to increasing fatigue and weakness as well as hypotension. She has  been observation for the last 12 months. She has no complaints today.  The recent CT scan of the chest showed evidence for disease progression with increase in the size of the bilateral pulmonary nodules. I discussed the scan results with the patient and her daughter.  I discussed with the patient several options for management of her condition including continuous observation and palliative care  versus consideration of treatment with immunotherapy with Nivolumab. The patient would like to continue on observation for now. She understands that she may continue to have disease progression. I will see her back for follow-up visit in 3 months with repeat CT scan of the chest. She was advised to call immediately if she has any concerning symptoms in the interval. The patient voices understanding of current disease status and treatment options and is in agreement with the current care plan.  All questions were answered. The patient knows to call the clinic with any problems, questions or concerns. We can certainly see the patient much sooner if necessary.  Disclaimer: This note was dictated with voice recognition software. Similar sounding words can inadvertently be transcribed and may not be corrected upon review.

## 2016-03-02 ENCOUNTER — Other Ambulatory Visit: Payer: Self-pay | Admitting: Internal Medicine

## 2016-05-26 ENCOUNTER — Other Ambulatory Visit (HOSPITAL_BASED_OUTPATIENT_CLINIC_OR_DEPARTMENT_OTHER): Payer: Medicare Other

## 2016-05-26 ENCOUNTER — Ambulatory Visit (HOSPITAL_COMMUNITY)
Admission: RE | Admit: 2016-05-26 | Discharge: 2016-05-26 | Disposition: A | Discharge status: Home / Self Care | Payer: Medicare Other | Source: Ambulatory Visit | Attending: Internal Medicine | Admitting: Internal Medicine

## 2016-05-26 DIAGNOSIS — C3431 Malignant neoplasm of lower lobe, right bronchus or lung: Secondary | ICD-10-CM

## 2016-05-26 DIAGNOSIS — R590 Localized enlarged lymph nodes: Secondary | ICD-10-CM | POA: Diagnosis not present

## 2016-05-26 DIAGNOSIS — C3491 Malignant neoplasm of unspecified part of right bronchus or lung: Secondary | ICD-10-CM

## 2016-05-26 LAB — COMPREHENSIVE METABOLIC PANEL
ALBUMIN: 3.6 g/dL (ref 3.5–5.0)
ALT: 10 U/L (ref 0–55)
ANION GAP: 11 meq/L (ref 3–11)
AST: 16 U/L (ref 5–34)
Alkaline Phosphatase: 105 U/L (ref 40–150)
BILIRUBIN TOTAL: 0.58 mg/dL (ref 0.20–1.20)
BUN: 23.4 mg/dL (ref 7.0–26.0)
CO2: 22 meq/L (ref 22–29)
CREATININE: 1.1 mg/dL (ref 0.6–1.1)
Calcium: 9.7 mg/dL (ref 8.4–10.4)
Chloride: 103 mEq/L (ref 98–109)
EGFR: 46 mL/min/{1.73_m2} — ABNORMAL LOW (ref >90)
GLUCOSE: 117 mg/dL (ref 70–140)
Potassium: 3.9 mEq/L (ref 3.5–5.1)
Sodium: 136 mEq/L (ref 136–145)
TOTAL PROTEIN: 7.4 g/dL (ref 6.4–8.3)

## 2016-05-26 LAB — CBC WITH DIFFERENTIAL/PLATELET
BASO%: 0.3 % (ref 0.0–2.0)
Basophils Absolute: 0 10*3/uL (ref 0.0–0.1)
EOS%: 5 % (ref 0.0–7.0)
Eosinophils Absolute: 0.3 10*3/uL (ref 0.0–0.5)
HCT: 33.3 % — ABNORMAL LOW (ref 34.8–46.6)
HEMOGLOBIN: 11 g/dL — AB (ref 11.6–15.9)
LYMPH#: 0.9 10*3/uL (ref 0.9–3.3)
LYMPH%: 14.9 % (ref 14.0–49.7)
MCH: 31.5 pg (ref 25.1–34.0)
MCHC: 33 g/dL (ref 31.5–36.0)
MCV: 95.4 fL (ref 79.5–101.0)
MONO#: 1.3 10*3/uL — AB (ref 0.1–0.9)
MONO%: 21.8 % — ABNORMAL HIGH (ref 0.0–14.0)
NEUT%: 58 % (ref 38.4–76.8)
NEUTROS ABS: 3.4 10*3/uL (ref 1.5–6.5)
PLATELETS: 164 10*3/uL (ref 145–400)
RBC: 3.49 10*6/uL — AB (ref 3.70–5.45)
RDW: 13.6 % (ref 11.2–14.5)
WBC: 5.8 10*3/uL (ref 3.9–10.3)

## 2016-05-26 MED ORDER — SODIUM CHLORIDE 0.9 % IJ SOLN
INTRAMUSCULAR | Status: AC
  Filled 2016-05-26: qty 50

## 2016-05-26 MED ORDER — IOPAMIDOL (ISOVUE-300) INJECTION 61%
INTRAVENOUS | Status: AC
  Administered 2016-05-26: 75 mL
  Filled 2016-05-26: qty 75

## 2016-05-28 ENCOUNTER — Encounter: Payer: Self-pay | Admitting: Internal Medicine

## 2016-05-28 ENCOUNTER — Ambulatory Visit (HOSPITAL_BASED_OUTPATIENT_CLINIC_OR_DEPARTMENT_OTHER): Payer: Medicare Other | Admitting: Internal Medicine

## 2016-05-28 ENCOUNTER — Telehealth: Payer: Self-pay | Admitting: Internal Medicine

## 2016-05-28 VITALS — BP 156/62 | HR 71 | Temp 97.5°F | Resp 17 | Ht 63.0 in | Wt 132.9 lb

## 2016-05-28 DIAGNOSIS — Z85118 Personal history of other malignant neoplasm of bronchus and lung: Secondary | ICD-10-CM

## 2016-05-28 DIAGNOSIS — I10 Essential (primary) hypertension: Secondary | ICD-10-CM | POA: Diagnosis not present

## 2016-05-28 DIAGNOSIS — C3491 Malignant neoplasm of unspecified part of right bronchus or lung: Secondary | ICD-10-CM

## 2016-05-28 NOTE — Telephone Encounter (Signed)
No LOS for patient at time of visit to scheduling.

## 2016-05-28 NOTE — Progress Notes (Signed)
[place] [phone]   [phone]  OFFICE PROGRESS NOTE  GREEN, [name] Bachelor, MD [address] [place] [address]  DIAGNOSIS: Stage IIIA (T3, N1, M0) non-small cell lung cancer, adenocarcinoma with negative EGFR mutation and negative for gene translocation diagnosed in May 2016 in a patient with remote history of smoking. She has multiple satellite nodules that would place her at a higher stage.  GUARDANT 360 Molecular study: positive for BRCA2, NOTCH1, RAF1 and NF![name]  PRIOR THERAPY: Systemic chemotherapy with carboplatin for an AUC of 5 and Alimta at 500 mg/m given every 3 weeks. Status post 5 cycles. Last dose was given 03/11/2015 with stable disease.   CURRENT THERAPY: Observation.  INTERVAL HISTORY: [name] Carlson 82 y.o. female came to the clinic today for her routine three-month follow-up visit. The patient is currently on observation and feeling fine. She has mild cough productive of whitish sputum. She denied having any chest pain or hemoptysis. She denied having any significant weight loss or night sweats. She continues to have elevated blood pressure and she is currently on verapamil and losartan by her primary care physician. She had repeat CT scan of the chest performed recently and she is here for evaluation and discussion of her scan results.  MEDICAL HISTORY: Past Medical History:  Diagnosis Date  . Cancer ([place])   . External carotid artery stenosis    L side dx'd with doppler 2012  . Hypertension   . Pneumonia   . Thyroid disease   . UTI (lower urinary tract infection)     ALLERGIES:  is allergic to corticosteroids and minocycline.  MEDICATIONS:  Current Outpatient Prescriptions  Medication Sig Dispense Refill  . levothyroxine (SYNTHROID, LEVOTHROID) 50 MCG tablet Take 50 mcg by mouth daily before breakfast. Reported on 07/25/2015    . losartan (COZAAR) 100 MG tablet Take 100 mg by mouth daily. Reported on  07/25/2015    . verapamil (CALAN-SR) 240 MG CR tablet Take 240 mg by mouth at bedtime.    Alveda Reasons 20 MG TABS tablet TAKE 1 TABLET EVERY DAY WITH SUPPER 30 tablet 4   No current facility-administered medications for this visit.     SURGICAL HISTORY:  Past Surgical History:  Procedure Laterality Date  . EYE SURGERY    . VIDEO BRONCHOSCOPY Bilateral 04/06/2014   Procedure: VIDEO BRONCHOSCOPY WITH FLUORO;  Surgeon: Juanito Doom, MD;  Location: [facility];  Service: Cardiopulmonary;  Laterality: Bilateral;    REVIEW OF SYSTEMS:  A comprehensive review of systems was negative except for: Constitutional: positive for fatigue Respiratory: positive for cough, dyspnea on exertion and sputum   PHYSICAL EXAMINATION: General appearance: alert, cooperative, fatigued and no distress Head: Normocephalic, without obvious abnormality, atraumatic Neck: no adenopathy, no JVD, supple, symmetrical, trachea midline and thyroid not enlarged, symmetric, no tenderness/mass/nodules Lymph nodes: Cervical, supraclavicular, and axillary nodes normal. Resp: clear to auscultation bilaterally Back: symmetric, no curvature. ROM normal. No CVA tenderness. Cardio: regular rate and rhythm, S1, S2 normal, no murmur, click, rub or gallop GI: soft, non-tender; bowel sounds normal; no masses,  no organomegaly Extremities: extremities normal, atraumatic, no cyanosis or edema  ECOG PERFORMANCE STATUS: 1 - Symptomatic but completely ambulatory  Blood pressure (!) 156/62, pulse 71, temperature 97.5 F (36.4 C), temperature source Oral, resp. rate 17, height '5\' 3"'  (1.6 m), weight 132 lb 14.4 oz (60.3 kg), SpO2 93 %.  LABORATORY DATA: Lab Results  Component Value Date  WBC 5.8 05/26/2016   HGB 11.0 (L) 05/26/2016   HCT 33.3 (L) 05/26/2016   MCV 95.4 05/26/2016   PLT 164 05/26/2016      Chemistry      Component Value Date/Time   NA 136 05/26/2016 1343   K 3.9 05/26/2016 1343   CL 108 03/25/2015 0555   CO2  22 05/26/2016 1343   BUN 23.4 05/26/2016 1343   CREATININE 1.1 05/26/2016 1343      Component Value Date/Time   CALCIUM 9.7 05/26/2016 1343   ALKPHOS 105 05/26/2016 1343   AST 16 05/26/2016 1343   ALT 10 05/26/2016 1343   BILITOT 0.58 05/26/2016 1343       RADIOGRAPHIC STUDIES: Ct Chest W Contrast  Result Date: 05/26/2016 CLINICAL DATA:  Lung cancer diagnosed June 2016. Chemotherapy complete. EXAM: CT CHEST WITH CONTRAST TECHNIQUE: Multidetector CT imaging of the chest was performed during intravenous contrast administration. CONTRAST:  60m ISOVUE-300 IOPAMIDOL (ISOVUE-300) INJECTION 61% COMPARISON:  CT 02/18/2016 FINDINGS: Cardiovascular: Coronary artery calcification and aortic atherosclerotic calcification. No pericardial effusion Mediastinum/Nodes: No axillary supraclavicular adenopathy. Mediastinal subcarinal adenopathy similar to comparison exam. Lungs/Pleura: Dense consolidation with air bronchograms the RIGHT lower lobe not changed. Multiple bilateral irregular pulmonary nodules in are again demonstrated which are slightly larger than comparison exam. Example LEFT lower lobe nodule measuring 12 mm (image 71, series 4) compares to 8 mm. LEFT lower lobe nodule measuring 18 mm (image 81, series 4 increased from 12 mm. Similar findings the RIGHT lung. RIGHT upper lobe nodule measuring 16 mm (image 45, series 4) is increased from 12 mm. There are least 20 peripheral lesions within each lung. Upper Abdomen: Limited view of the liver, kidneys, pancreas are unremarkable. Normal adrenal glands. Musculoskeletal: No aggressive osseous lesion. IMPRESSION: 1. Mild interval increase in size of multiple peripheral irregular nodules in LEFT an d RIGHT lung most consistent with metastatic lung cancer progression. 2. Dense consolidation with air bronchograms in the RIGHT lower lobe is unchanged and most consists with infiltrated neoplasm versus chronic infection. 3. Stable mediastinal lymphadenopathy.  Electronically Signed   By: SSuzy BouchardM.D.   On: 05/26/2016 16:57    ASSESSMENT AND PLAN: This is a very pleasant 82years old white female with history of stage IIIB non-small cell lung cancer, adenocarcinoma. She was treated with systemic chemotherapy with carboplatin and Alimta status post 5 cycles. The patient had a rough time with her previous chemotherapy. She is currently on observation. Her recent CT scan of the chest showed mild disease progression. I discussed the scan results with the patient today. I gave her the option of continuous observation and close monitoring versus consideration of treatment with immunotherapy. The patient would like to continue on observation for now. I will see her back for follow-up visit in 3 months for evaluation with repeat CT scan of the chest. For hypertension, she was advised to continue taking her blood pressure medication as prescribed by her primary care physician.  She was advised to call immediately if she has any concerning symptoms in the interval. The patient voices understanding of current disease status and treatment options and is in agreement with the current care plan.  All questions were answered. The patient knows to call the clinic with any problems, questions or concerns. We can certainly see the patient much sooner if necessary.  Disclaimer: This note was dictated with voice recognition software. Similar sounding words can inadvertently be transcribed and may not be corrected upon review.

## 2016-06-11 ENCOUNTER — Telehealth: Payer: Self-pay | Admitting: General Practice

## 2016-06-11 NOTE — Telephone Encounter (Signed)
Spoke with pt confirmed March 2018 appts.

## 2016-08-10 ENCOUNTER — Other Ambulatory Visit: Payer: Self-pay | Admitting: Medical Oncology

## 2016-08-10 DIAGNOSIS — I82409 Acute embolism and thrombosis of unspecified deep veins of unspecified lower extremity: Secondary | ICD-10-CM

## 2016-08-10 MED ORDER — RIVAROXABAN 20 MG PO TABS
ORAL_TABLET | ORAL | 4 refills | Status: AC
Start: 2016-08-10 — End: ?

## 2016-08-20 ENCOUNTER — Telehealth: Payer: Self-pay | Admitting: Internal Medicine

## 2016-08-20 NOTE — Telephone Encounter (Signed)
Called and confirmed appointment change

## 2016-08-26 ENCOUNTER — Other Ambulatory Visit (HOSPITAL_BASED_OUTPATIENT_CLINIC_OR_DEPARTMENT_OTHER): Payer: Medicare Other

## 2016-08-26 ENCOUNTER — Ambulatory Visit (HOSPITAL_COMMUNITY)
Admission: RE | Admit: 2016-08-26 | Discharge: 2016-08-26 | Disposition: A | Discharge status: Home / Self Care | Payer: Medicare Other | Source: Ambulatory Visit | Attending: Internal Medicine | Admitting: Internal Medicine

## 2016-08-26 DIAGNOSIS — C3491 Malignant neoplasm of unspecified part of right bronchus or lung: Secondary | ICD-10-CM | POA: Diagnosis not present

## 2016-08-26 DIAGNOSIS — Z85118 Personal history of other malignant neoplasm of bronchus and lung: Secondary | ICD-10-CM

## 2016-08-26 DIAGNOSIS — C7802 Secondary malignant neoplasm of left lung: Secondary | ICD-10-CM | POA: Insufficient documentation

## 2016-08-26 DIAGNOSIS — I700 Atherosclerosis of aorta: Secondary | ICD-10-CM | POA: Insufficient documentation

## 2016-08-26 DIAGNOSIS — I2510 Atherosclerotic heart disease of native coronary artery without angina pectoris: Secondary | ICD-10-CM | POA: Insufficient documentation

## 2016-08-26 DIAGNOSIS — C7801 Secondary malignant neoplasm of right lung: Secondary | ICD-10-CM | POA: Insufficient documentation

## 2016-08-26 DIAGNOSIS — R590 Localized enlarged lymph nodes: Secondary | ICD-10-CM | POA: Diagnosis not present

## 2016-08-26 LAB — COMPREHENSIVE METABOLIC PANEL
ALBUMIN: 3.8 g/dL (ref 3.5–5.0)
ALK PHOS: 92 U/L (ref 40–150)
ALT: 13 U/L (ref 0–55)
ANION GAP: 10 meq/L (ref 3–11)
AST: 15 U/L (ref 5–34)
BILIRUBIN TOTAL: 0.68 mg/dL (ref 0.20–1.20)
BUN: 26.5 mg/dL — ABNORMAL HIGH (ref 7.0–26.0)
CALCIUM: 9.6 mg/dL (ref 8.4–10.4)
CO2: 20 mEq/L — ABNORMAL LOW (ref 22–29)
Chloride: 104 mEq/L (ref 98–109)
Creatinine: 1.1 mg/dL (ref 0.6–1.1)
EGFR: 44 mL/min/{1.73_m2} — AB (ref >90)
GLUCOSE: 153 mg/dL — AB (ref 70–140)
Potassium: 3.8 mEq/L (ref 3.5–5.1)
SODIUM: 135 meq/L — AB (ref 136–145)
Total Protein: 7.1 g/dL (ref 6.4–8.3)

## 2016-08-26 LAB — CBC WITH DIFFERENTIAL/PLATELET
BASO%: 0.7 % (ref 0.0–2.0)
BASOS ABS: 0 10*3/uL (ref 0.0–0.1)
EOS%: 5.5 % (ref 0.0–7.0)
Eosinophils Absolute: 0.3 10*3/uL (ref 0.0–0.5)
HEMATOCRIT: 34.5 % — AB (ref 34.8–46.6)
HEMOGLOBIN: 11.5 g/dL — AB (ref 11.6–15.9)
LYMPH#: 0.8 10*3/uL — AB (ref 0.9–3.3)
LYMPH%: 17.1 % (ref 14.0–49.7)
MCH: 31.7 pg (ref 25.1–34.0)
MCHC: 33.4 g/dL (ref 31.5–36.0)
MCV: 94.9 fL (ref 79.5–101.0)
MONO#: 0.6 10*3/uL (ref 0.1–0.9)
MONO%: 13.4 % (ref 0.0–14.0)
NEUT#: 2.9 10*3/uL (ref 1.5–6.5)
NEUT%: 63.3 % (ref 38.4–76.8)
PLATELETS: 192 10*3/uL (ref 145–400)
RBC: 3.64 10*6/uL — ABNORMAL LOW (ref 3.70–5.45)
RDW: 14.2 % (ref 11.2–14.5)
WBC: 4.5 10*3/uL (ref 3.9–10.3)

## 2016-08-26 MED ORDER — IOPAMIDOL (ISOVUE-300) INJECTION 61%
INTRAVENOUS | Status: AC
  Administered 2016-08-26: 75 mL
  Filled 2016-08-26: qty 75

## 2016-08-26 MED ORDER — IOPAMIDOL (ISOVUE-300) INJECTION 61%
INTRAVENOUS | Status: DC
Start: 2016-08-26 — End: 2016-08-27
  Filled 2016-08-26: qty 75

## 2016-09-02 ENCOUNTER — Ambulatory Visit: Payer: Medicare Other | Admitting: Internal Medicine

## 2016-09-03 ENCOUNTER — Encounter: Payer: Self-pay | Admitting: Internal Medicine

## 2016-09-03 ENCOUNTER — Ambulatory Visit (HOSPITAL_BASED_OUTPATIENT_CLINIC_OR_DEPARTMENT_OTHER): Payer: Medicare Other | Admitting: Internal Medicine

## 2016-09-03 VITALS — BP 130/48 | HR 59 | Resp 18 | Ht 63.0 in | Wt 124.6 lb

## 2016-09-03 DIAGNOSIS — R05 Cough: Secondary | ICD-10-CM

## 2016-09-03 DIAGNOSIS — C3490 Malignant neoplasm of unspecified part of unspecified bronchus or lung: Secondary | ICD-10-CM

## 2016-09-03 DIAGNOSIS — Z5112 Encounter for antineoplastic immunotherapy: Secondary | ICD-10-CM | POA: Insufficient documentation

## 2016-09-03 DIAGNOSIS — R059 Cough, unspecified: Secondary | ICD-10-CM

## 2016-09-03 DIAGNOSIS — C3491 Malignant neoplasm of unspecified part of right bronchus or lung: Secondary | ICD-10-CM

## 2016-09-03 DIAGNOSIS — Z7189 Other specified counseling: Secondary | ICD-10-CM

## 2016-09-03 HISTORY — DX: Other specified counseling: Z71.89

## 2016-09-03 NOTE — Progress Notes (Signed)
[place] [phone]   [phone]  OFFICE PROGRESS NOTE  GREEN, [name], MD [address] [place] [address]  DIAGNOSIS: Stage IIIA (T3, N1, M0) non-small cell lung cancer, adenocarcinoma with negative EGFR mutation and negative for gene translocation diagnosed in May 2016 in a patient with remote history of smoking. She has multiple satellite nodules that would place her at a higher stage.  GUARDANT 360 Molecular study: positive for BRCA2, NOTCH1, RAF1 and NF![name]  PRIOR THERAPY: Systemic chemotherapy with carboplatin for an AUC of 5 and Alimta at 500 mg/m given every 3 weeks. Status post 5 cycles. Last dose was given 03/11/2015 with stable disease.   CURRENT THERAPY: Observation.  INTERVAL HISTORY: [name] Carlson 83 y.o. female returns to the clinic today for follow-up visit. The patient is feeling fine with no specific complaints except for dry cough. She started Delsym yesterday. She has no chest pain but has shortness breath with exertion with no hemoptysis. She has no significant weight loss or night sweats. She has no fever or chills. She has no nausea, vomiting, diarrhea or constipation. She has been observation since September 2016. The patient had repeat CT scan of the chest performed recently and she is here for evaluation and discussion of her scan results.  MEDICAL HISTORY: Past Medical History:  Diagnosis Date  . Cancer ([place])   . External carotid artery stenosis    L side dx'd with doppler 2012  . Hypertension   . Pneumonia   . Thyroid disease   . UTI (lower urinary tract infection)     ALLERGIES:  is allergic to corticosteroids and minocycline.  MEDICATIONS:  Current Outpatient Prescriptions  Medication Sig Dispense Refill  . Dextromethorphan-Guaifenesin (DELSYM COUGH/CHEST CONGEST DM) 5-100 MG/5ML LIQD Take 5 mLs by mouth every 12 (twelve) hours.    [name] levothyroxine (SYNTHROID, LEVOTHROID) 50 MCG  tablet Take 50 mcg by mouth daily before breakfast. Reported on 07/25/2015    . losartan (COZAAR) 100 MG tablet Take 100 mg by mouth daily. Reported on 07/25/2015    . rivaroxaban (XARELTO) 20 MG TABS tablet TAKE 1 TABLET EVERY DAY WITH SUPPER 30 tablet 4  . verapamil (CALAN-SR) 240 MG CR tablet Take 240 mg by mouth at bedtime.     No current facility-administered medications for this visit.     SURGICAL HISTORY:  Past Surgical History:  Procedure Laterality Date  . EYE SURGERY    . VIDEO BRONCHOSCOPY Bilateral 04/06/2014   Procedure: VIDEO BRONCHOSCOPY WITH FLUORO;  Surgeon: Juanito Doom, MD;  Location: [facility];  Service: Cardiopulmonary;  Laterality: Bilateral;    REVIEW OF SYSTEMS:  Constitutional: positive for fatigue Eyes: negative Ears, nose, mouth, throat, and face: negative Respiratory: positive for cough and dyspnea on exertion Cardiovascular: negative Gastrointestinal: negative Genitourinary:negative Integument/breast: negative Hematologic/lymphatic: negative Musculoskeletal:positive for back pain Neurological: negative Behavioral/Psych: negative Endocrine: negative Allergic/Immunologic: negative   PHYSICAL EXAMINATION: General appearance: alert, cooperative, fatigued and no distress Head: Normocephalic, without obvious abnormality, atraumatic Neck: no adenopathy, no JVD, supple, symmetrical, trachea midline and thyroid not enlarged, symmetric, no tenderness/mass/nodules Lymph nodes: Cervical, supraclavicular, and axillary nodes normal. Resp: clear to auscultation bilaterally Back: symmetric, no curvature. ROM normal. No CVA tenderness. Cardio: regular rate and rhythm, S1, S2 normal, no murmur, click, rub or gallop GI: soft, non-tender; bowel sounds normal; no masses,  no organomegaly Extremities: extremities normal, atraumatic, no cyanosis or edema Neurologic: Alert and oriented X 3, normal  strength and tone. Normal symmetric reflexes. Normal coordination and  gait  ECOG PERFORMANCE STATUS: 1 - Symptomatic but completely ambulatory  Blood pressure (!) 130/48, pulse (!) 59, resp. rate 18, height '5\' 3"'  (1.6 m), weight 124 lb 9.6 oz (56.5 kg), SpO2 93 %.  LABORATORY DATA: Lab Results  Component Value Date   WBC 4.5 08/26/2016   HGB 11.5 (L) 08/26/2016   HCT 34.5 (L) 08/26/2016   MCV 94.9 08/26/2016   PLT 192 08/26/2016      Chemistry      Component Value Date/Time   NA 135 (L) 08/26/2016 1200   K 3.8 08/26/2016 1200   CL 108 03/25/2015 0555   CO2 20 (L) 08/26/2016 1200   BUN 26.5 (H) 08/26/2016 1200   CREATININE 1.1 08/26/2016 1200      Component Value Date/Time   CALCIUM 9.6 08/26/2016 1200   ALKPHOS 92 08/26/2016 1200   AST 15 08/26/2016 1200   ALT 13 08/26/2016 1200   BILITOT 0.68 08/26/2016 1200       RADIOGRAPHIC STUDIES: Ct Chest W Contrast  Result Date: 08/26/2016 CLINICAL DATA:  Re- stage primary bronchogenic adenocarcinoma of the right lower lobe diagnosed May 2016, most recent chemotherapy 03/11/2015, presenting for follow-up on observation. Most recent chest CT studies have demonstrated progression of disease. Worsening cough. EXAM: CT CHEST WITH CONTRAST TECHNIQUE: Multidetector CT imaging of the chest was performed during intravenous contrast administration. CONTRAST:  34m ISOVUE-300 IOPAMIDOL (ISOVUE-300) INJECTION 61% COMPARISON:  05/26/2016 chest CT. FINDINGS: Cardiovascular: Stable borderline mild cardiomegaly. No significant pericardial fluid/thickening. Left main, left anterior descending and left circumflex coronary atherosclerosis. Atherosclerotic nonaneurysmal thoracic aorta. Normal caliber pulmonary arteries . No central pulmonary emboli. Mediastinum/Nodes: No discrete thyroid nodules. Unremarkable esophagus. No axillary adenopathy. Stable mildly enlarged 1.2 cm right paratracheal node (series 2/ image 53). Stable enlarged 1.3 cm subcarinal node (series 2/ image 66). Right hilar adenopathy measuring up to 1.4 cm  (series 2/ image 66), previously 1.2 cm, mildly increased. No new pathologically enlarged mediastinal or hilar lymph nodes. Lungs/Pleura: No pneumothorax. Stable trace dependent right pleural effusion. No left pleural effusion. Large predominantly solid right lower lobe lung mass replacing much of the right lower lung lobe measures approximately 9.8 x 7.6 cm (series 5/ image 96), previously 9.8 x 7.4 cm, not appreciably changed. Innumerable irregular confluent sub solid pulmonary nodules throughout the periphery of both lungs have increased in size in the interval. For example a confluent peripheral right upper lobe 4.8 x 1.6 cm mass or series 5/ image 49), previously 3.9 x 1.5 cm, increased. A peripheral left upper lobe 2.1 x 1.7 cm nodule (series 5/ image 52), increased from 1.6 x 1.2 cm . Anterior left lower lobe 1.7 x 1.5 cm nodule (series 5/ image 60), increased from 1.2 x 1.2 cm. Upper abdomen: Stable appearance of the adrenal glands without discrete adrenal nodules. Musculoskeletal: No aggressive appearing focal osseous lesions. Mild thoracic spondylosis . IMPRESSION: 1. Continued progression of widespread confluent metastatic subsolid nodules throughout the periphery of both lungs. 2. Large primary lung malignancy replacing much of the right lower lobe is grossly stable. 3. Right hilar and mediastinal lymphadenopathy is stable to mildly increased. 4. Aortic atherosclerosis. Left main and two-vessel coronary atherosclerosis. Electronically Signed   By: JIlona SorrelM.D.   On: 08/26/2016 14:48    ASSESSMENT AND PLAN:  This is a very pleasant 83years old white female with now metastatic non-small cell lung cancer initially diagnosed as a stage IIIB adenocarcinoma.  She was treated with a course of systemic chemotherapy with carboplatin and Alimta for 5 cycles discontinued secondary to intolerance. She has been observation since September 2016. She had repeat CT scan of the chest performed recently. I  personally and independently reviewed the scan images and discuss the results with the patient today. Unfortunately the recent CT scan of the chest showed further progression of her disease with metastatic small cell solid nodules throughout both lungs in addition to consolidative lesion in the right lower lobe and mediastinal lymphadenopathy. I discussed with the patient several options for management of her condition including palliative care and hospice referral versus consideration for treatment with immunotherapy with Nivolumab 240 mg IV every 2 weeks. I discussed with the patient the adverse effect of the immunotherapy including but not limited to immune mediated skin rash, diarrhea, pneumonitis, liver, renal, thyroid or other endocrine dysfunction. The patient was given handout about the immunotherapy with Nivolumab. She would like to take some time to think about her options and discuss with the family before making the decision. If she is not interested in treatment, I would strongly consider her for palliative care and hospice referral. For the cough, the patient will continue her current treatment with Delsym. She was advised to call immediately if she has any concerning symptoms in the interval. The patient voices understanding of current disease status and treatment options and is in agreement with the current care plan.  All questions were answered. The patient knows to call the clinic with any problems, questions or concerns. We can certainly see the patient much sooner if necessary.  Disclaimer: This note was dictated with voice recognition software. Similar sounding words can inadvertently be transcribed and may not be corrected upon review.

## 2016-09-10 ENCOUNTER — Telehealth: Payer: Self-pay | Admitting: Medical Oncology

## 2016-09-10 ENCOUNTER — Other Ambulatory Visit: Payer: Self-pay | Admitting: Medical Oncology

## 2016-09-10 DIAGNOSIS — C3491 Malignant neoplasm of unspecified part of right bronchus or lung: Secondary | ICD-10-CM

## 2016-09-10 NOTE — Telephone Encounter (Signed)
asking about getting oxygen for home.I referred pt to [facility] for oxygen sat testing. [facility] will not do oxygen testing.I instructed pt to take delsym every 12 hours. Appt made for oxygen sat testing tomorrow at cancer center .

## 2016-09-11 ENCOUNTER — Ambulatory Visit: Payer: Medicare Other | Admitting: *Deleted

## 2016-09-11 DIAGNOSIS — C3491 Malignant neoplasm of unspecified part of right bronchus or lung: Secondary | ICD-10-CM

## 2016-09-11 NOTE — Progress Notes (Signed)
Pt come to clinic with daughter for testing of O2 saturations.  At rest O2 sats 92%, while ambulating O2 sats down to 90%, while ambulating with oxygen at 2L O2 sats 96%.  Reviewed results with Abelina Bachelor, RN.  Qualifications with [facility] are 88% or lower for home oxygen.  Reviewed qualifications with patient.  Option given to patient to continue ambulating to see if any O2 sat changes.  Pt refused and stated she feels fine at home, and takes rest breaks if needed.  Instructed pt to call clinic if symptoms worsen or if rest breaks do not help pt to recover.  Pt verbalized understanding.  Pt and daughter thankful for appointment.

## 2016-09-24 ENCOUNTER — Other Ambulatory Visit: Payer: Self-pay | Admitting: Internal Medicine

## 2016-09-24 ENCOUNTER — Ambulatory Visit
Admission: RE | Admit: 2016-09-24 | Discharge: 2016-09-24 | Disposition: A | Discharge status: Home / Self Care | Payer: Medicare Other | Source: Ambulatory Visit | Attending: Internal Medicine | Admitting: Internal Medicine

## 2016-09-24 DIAGNOSIS — B999 Unspecified infectious disease: Secondary | ICD-10-CM

## 2016-09-25 IMAGING — MR MR HEAD WO/W CM
12 of 15 series · 31 of 48 positions shown · IV contrast (multihance)
Comparison: 01/08/2006 head CT.  No comparison brain MR.

CLINICAL DATA: 89-year-old hypertensive female with history of lung
cancer. Staging exam. Initial encounter.

EXAM:
MRI HEAD WITHOUT AND WITH CONTRAST
TECHNIQUE: Multiplanar, multiecho pulse sequences of the brain and surrounding
structures were obtained without and with intravenous contrast.
CONTRAST:  15 cc MultiHance.

[Series 3: FLAIR · sagittal · 5.0mm · 0.47mm/px · 2 of 23 slices shown (1 of 2)]
[im 1/23]
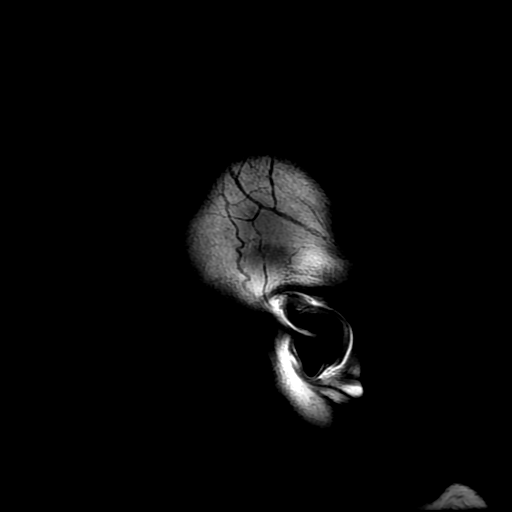
[im 23/23]
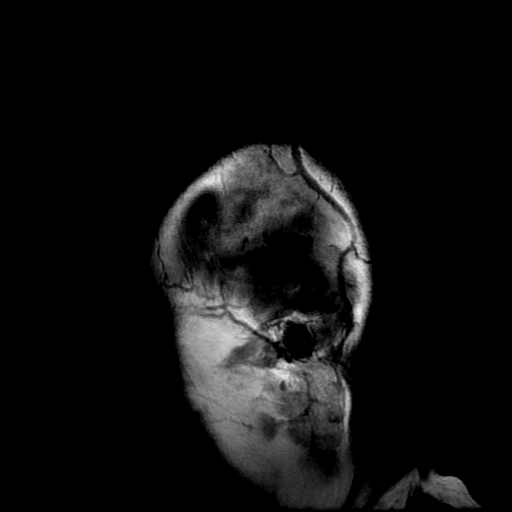

[Series 5: DWI · axial · 3.0mm · 0.94mm/px · z∈[-148,-22]mm · 7 of 94 slices shown (1 of 4)]
[im 1/94]
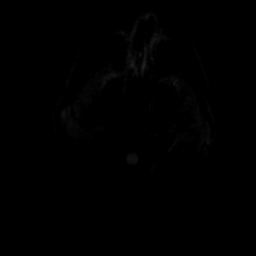
[im 16/94]
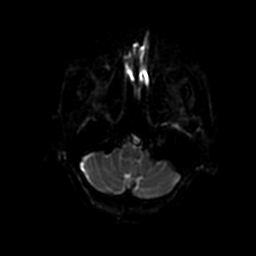
[im 32/94]
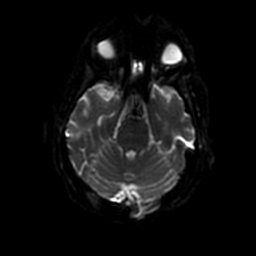
[im 47/94]
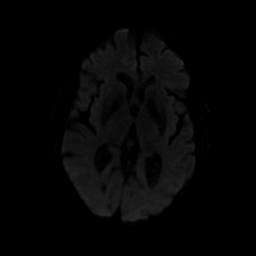
[im 63/94]
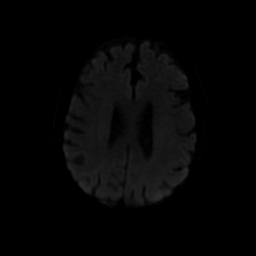
[im 78/94]
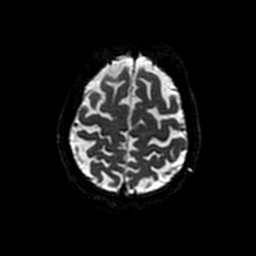
[im 94/94]
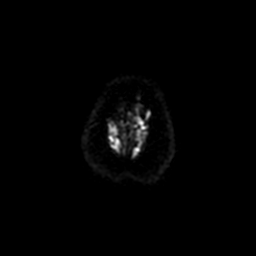

[Series 6: T2 · axial · 5.0mm · 0.47mm/px · z∈[-133,-16]mm · 2 of 22 slices shown (1 of 2)]
[im 1/22]
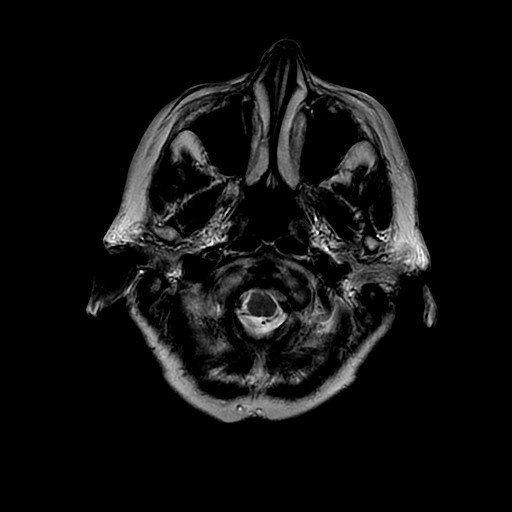
[im 22/22]
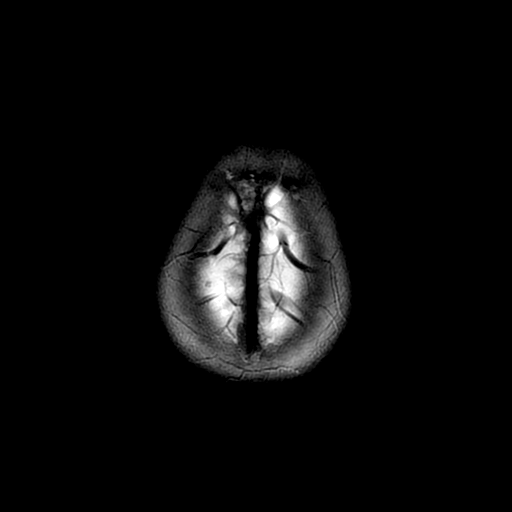

[Series 7: FLAIR · axial · 5.0mm · 0.47mm/px · 1 of 22 slices shown (2 of 2)]
[im 1/22]
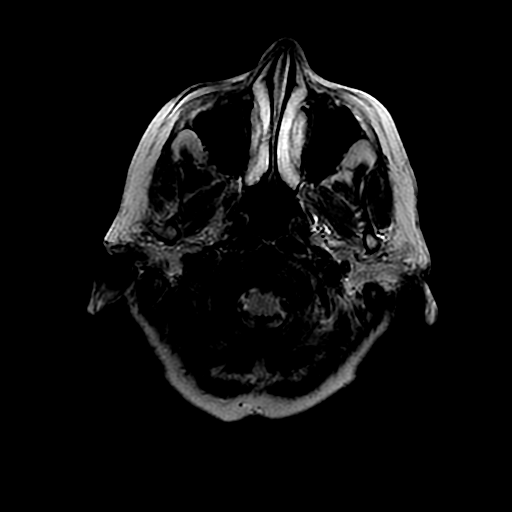

[Series 8: DWI · coronal · 5.0mm · 0.94mm/px · 5 of 72 slices shown (2 of 4)]
[im 1/72]
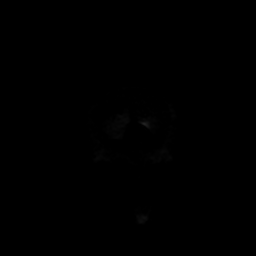
[im 18/72]
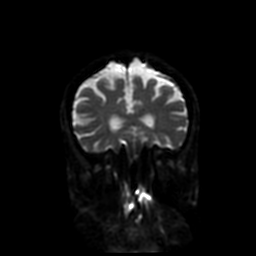
[im 36/72]
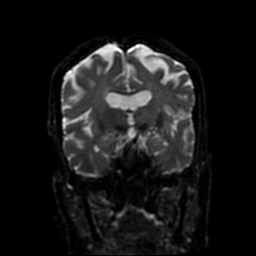
[im 54/72]
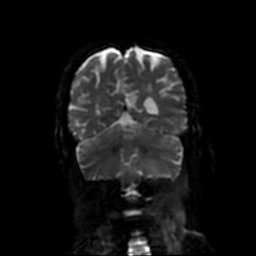
[im 72/72]
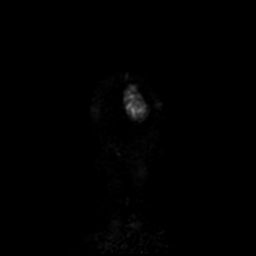

[Series 9: (person_name) · axial · 3.0mm · 0.47mm/px · z∈[-132,-80]mm · 3 of 92 slices shown]
[im 1/92]
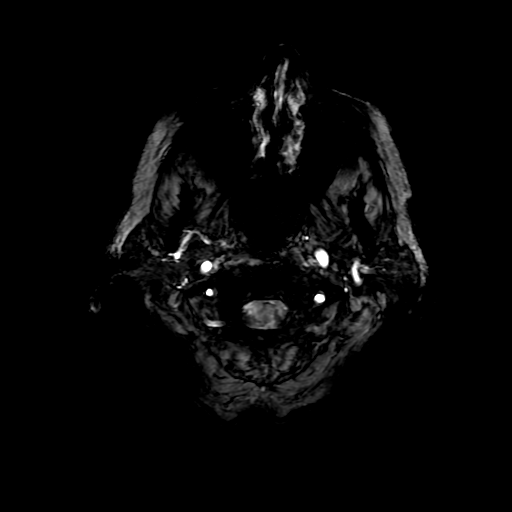
[im 19/92]
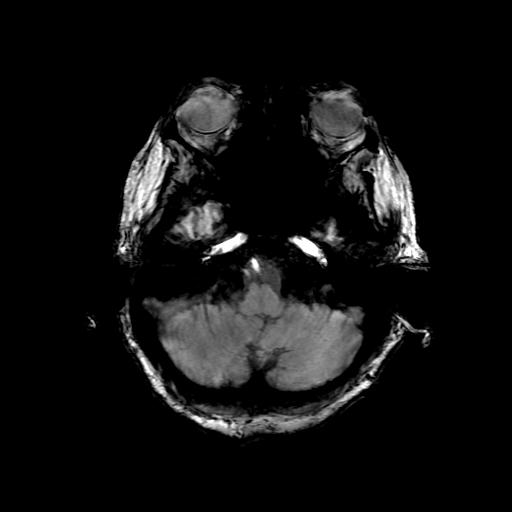
[im 37/92]
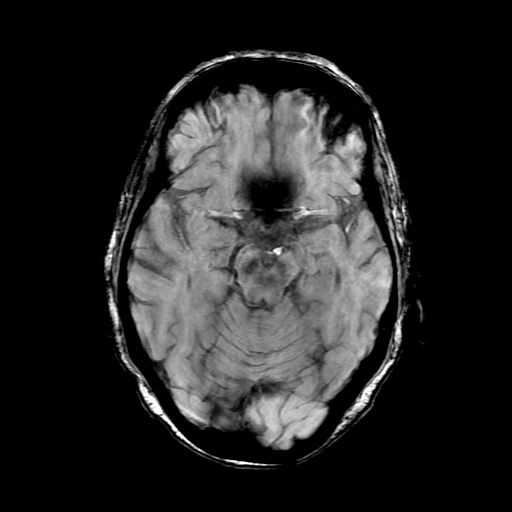

[Series 11: T2 · coronal · 5.0mm · 0.39mm/px · 2 of 30 slices shown (2 of 2)]
[im 1/30]
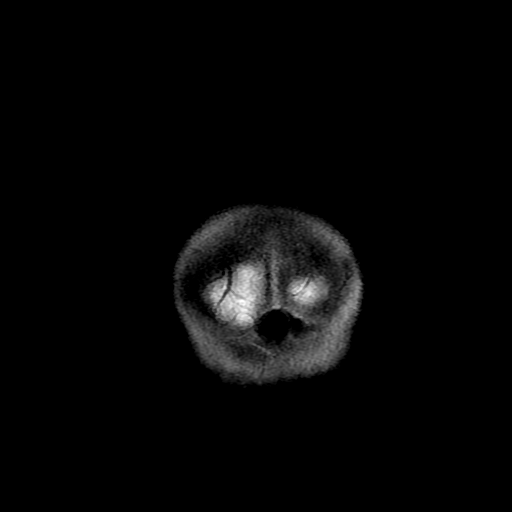
[im 30/30]
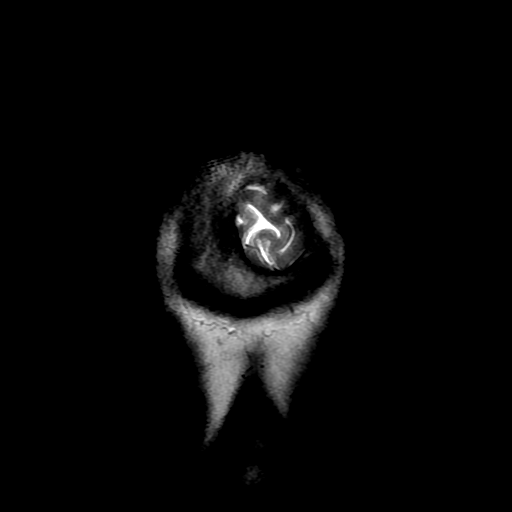

[Series 13: T1 · axial · 5.0mm · 0.47mm/px · 1 of 22 slices shown (1 of 3)]
[im 1/22]
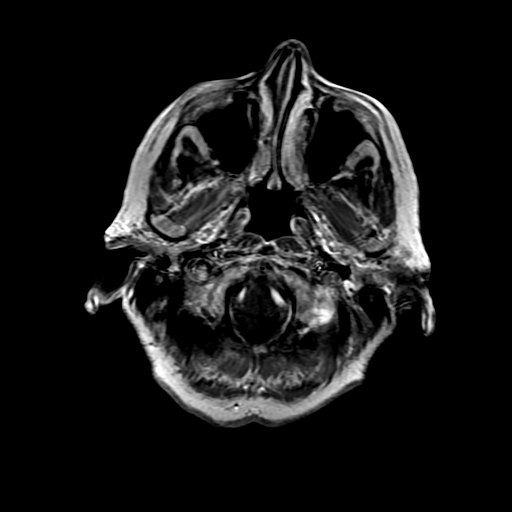

[Series 14: T1 · coronal · 5.0mm · 0.39mm/px · 2 of 30 slices shown (2 of 3)]
[im 1/30]
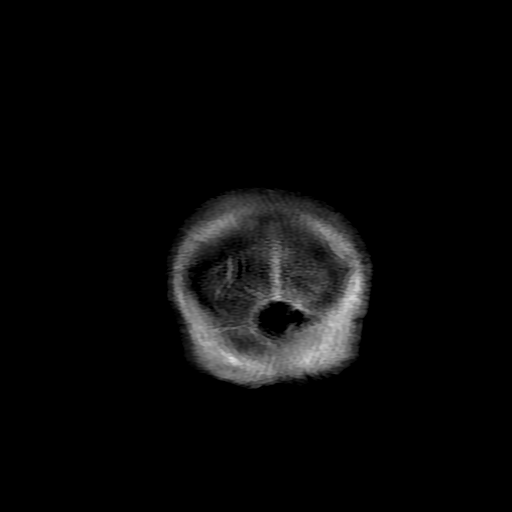
[im 30/30]
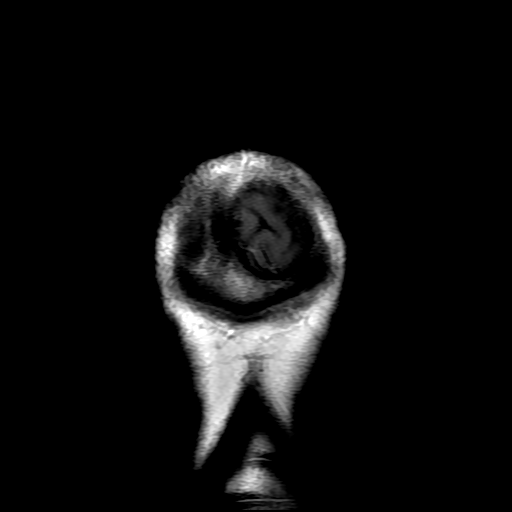

[Series 15: T1 · sagittal · 5.0mm · 0.47mm/px · 1 of 23 slices shown (3 of 3)]
[im 1/23]
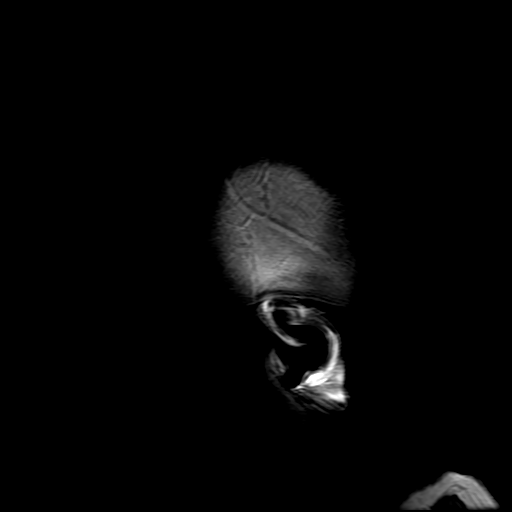

[Series 500: DWI · axial · 3.0mm · 0.94mm/px · z∈[-148,-22]mm · 3 of 47 slices shown (3 of 4)]
[im 1/47]
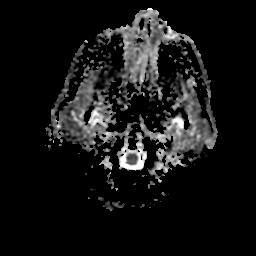
[im 24/47]
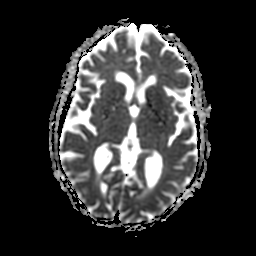
[im 47/47]
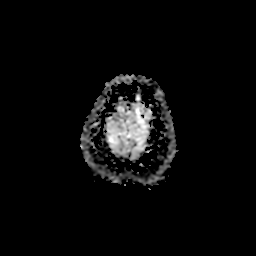

[Series 800: DWI · coronal · 5.0mm · 0.94mm/px · 2 of 36 slices shown (4 of 4)]
[im 1/36  full-range]
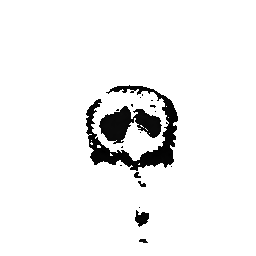
[im 36/36  full-range]
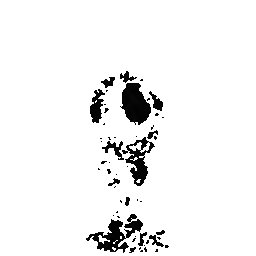

[31 of 48 positions shown; findings below may reference images not displayed]

FINDINGS: No intracranial enhancing lesion or bony destructive lesion to
suggest presence of intracranial metastatic disease.

No acute infarct.

No intracranial hemorrhage.

Osteoma left temporal-frontal calvarium unchanged and felt to be an
incidental finding.

Global mild atrophy.  No hydrocephalus.

Mild small vessel disease type changes.

Atherosclerotic type changes right vertebral artery. Major
intracranial vascular structures are patent.

Post lens replacement.

Cervical medullary junction unremarkable.
IMPRESSION: No evidence of intracranial metastatic disease.

No acute infarct.

Mild small vessel disease type changes.

Atherosclerotic type changes right vertebral artery suspected.

## 2016-10-12 ENCOUNTER — Telehealth: Payer: Self-pay

## 2016-10-12 NOTE — Telephone Encounter (Signed)
[name] Carlson called with a question about oxygen. Dr Nyoka Cowden made a suggestion that she needs oxygen at home. Her family also feels she needs home oxygen. On 3/30 [name] Carlson did saturations and [name] Carlson got down to 90 % walking on room air. She declined at that time to walk further stating she was OK at home and takes breaks when needed. She is now willing to get home O2.

## 2016-10-12 NOTE — Telephone Encounter (Signed)
Called pt back, and per last MD progress. Pt would be calling back regarding her decision for palliative/hospice or additional treatment. Inquired with pt about a decision, pt states " I have to go to a wedding this weekend and I will not be making any decisions until after the wedding. My family thinks I need oxygen. I think I'm doing ok." Informed pt she can come in at her convience and we can  take her oxygen sats. Explained this requires walking with and without oxygen. Pt verbalized understanding.

## 2016-11-02 ENCOUNTER — Telehealth: Payer: Self-pay | Admitting: Medical Oncology

## 2016-11-02 NOTE — Telephone Encounter (Signed)
Son asking about palliative care services vs hospice. She is getting weaker and on oxygen. She has not decided about further therapy . I made referral to palliative care per [facility]'s reqeust.

## 2016-11-04 ENCOUNTER — Telehealth: Payer: Self-pay | Admitting: Medical Oncology

## 2016-11-04 NOTE — Telephone Encounter (Signed)
Son is requesting [facility] services as pt declining . She is sob and dependent on  family members to assist her with ADLs.Referral made to Hospice with [facility] attending

## 2016-11-04 NOTE — Telephone Encounter (Signed)
ok 

## 2017-01-13 DEATH — deceased
